# Patient Record
Sex: Male | Born: 1955 | Race: White | Hispanic: No | Smoking: Current every day smoker
Health system: Southern US, Community
[De-identification: ages and names within clinical notes are randomized; demographics above are authoritative.]

## PROBLEM LIST (undated history)

## (undated) ENCOUNTER — Ambulatory Visit (HOSPITAL_BASED_OUTPATIENT_CLINIC_OR_DEPARTMENT_OTHER): Admission: EM

## (undated) DIAGNOSIS — E785 Hyperlipidemia, unspecified: Secondary | ICD-10-CM

## (undated) DIAGNOSIS — N3281 Overactive bladder: Secondary | ICD-10-CM

## (undated) DIAGNOSIS — C189 Malignant neoplasm of colon, unspecified: Secondary | ICD-10-CM

## (undated) DIAGNOSIS — I2510 Atherosclerotic heart disease of native coronary artery without angina pectoris: Secondary | ICD-10-CM

## (undated) DIAGNOSIS — F329 Major depressive disorder, single episode, unspecified: Secondary | ICD-10-CM

## (undated) DIAGNOSIS — G4700 Insomnia, unspecified: Secondary | ICD-10-CM

## (undated) DIAGNOSIS — N400 Enlarged prostate without lower urinary tract symptoms: Secondary | ICD-10-CM

## (undated) DIAGNOSIS — Z87442 Personal history of urinary calculi: Secondary | ICD-10-CM

## (undated) DIAGNOSIS — T07XXXA Unspecified multiple injuries, initial encounter: Secondary | ICD-10-CM

## (undated) DIAGNOSIS — F32A Depression, unspecified: Secondary | ICD-10-CM

## (undated) DIAGNOSIS — F419 Anxiety disorder, unspecified: Secondary | ICD-10-CM

## (undated) HISTORY — PX: HAND SURGERY: SHX662

## (undated) HISTORY — DX: Hyperlipidemia, unspecified: E78.5

## (undated) HISTORY — DX: Depression, unspecified: F32.A

## (undated) HISTORY — DX: Overactive bladder: N32.81

## (undated) HISTORY — PX: OTHER SURGICAL HISTORY: SHX169

## (undated) HISTORY — DX: Malignant neoplasm of colon, unspecified: C18.9

## (undated) HISTORY — DX: Atherosclerotic heart disease of native coronary artery without angina pectoris: I25.10

## (undated) HISTORY — DX: Major depressive disorder, single episode, unspecified: F32.9

## (undated) HISTORY — DX: Insomnia, unspecified: G47.00

## (undated) HISTORY — DX: Anxiety disorder, unspecified: F41.9

## (undated) HISTORY — PX: ELBOW SURGERY: SHX618

## (undated) HISTORY — DX: Personal history of urinary calculi: Z87.442

## (undated) HISTORY — PX: HERNIA REPAIR: SHX51

## (undated) HISTORY — DX: Unspecified multiple injuries, initial encounter: T07.XXXA

## (undated) HISTORY — PX: HIP FRACTURE SURGERY: SHX118

## (undated) HISTORY — DX: Benign prostatic hyperplasia without lower urinary tract symptoms: N40.0

---

## 1998-10-30 ENCOUNTER — Ambulatory Visit (HOSPITAL_BASED_OUTPATIENT_CLINIC_OR_DEPARTMENT_OTHER): Admission: RE | Admit: 1998-10-30 | Discharge: 1998-10-30 | Payer: Self-pay | Admitting: General Surgery

## 1999-05-26 ENCOUNTER — Emergency Department (HOSPITAL_COMMUNITY): Admission: EM | Admit: 1999-05-26 | Discharge: 1999-05-26 | Payer: Self-pay | Admitting: Emergency Medicine

## 2000-01-11 ENCOUNTER — Emergency Department (HOSPITAL_COMMUNITY): Admission: EM | Admit: 2000-01-11 | Discharge: 2000-01-11 | Payer: Self-pay | Admitting: Emergency Medicine

## 2000-01-16 ENCOUNTER — Emergency Department (HOSPITAL_COMMUNITY): Admission: EM | Admit: 2000-01-16 | Discharge: 2000-01-16 | Payer: Self-pay | Admitting: Emergency Medicine

## 2001-10-20 DIAGNOSIS — C189 Malignant neoplasm of colon, unspecified: Secondary | ICD-10-CM

## 2001-10-20 HISTORY — DX: Malignant neoplasm of colon, unspecified: C18.9

## 2002-02-17 HISTORY — PX: COLECTOMY: SHX59

## 2002-02-24 ENCOUNTER — Ambulatory Visit (HOSPITAL_COMMUNITY): Admission: RE | Admit: 2002-02-24 | Discharge: 2002-02-24 | Payer: Self-pay | Admitting: Surgery

## 2002-02-24 ENCOUNTER — Encounter: Payer: Self-pay | Admitting: Surgery

## 2002-02-28 ENCOUNTER — Encounter: Payer: Self-pay | Admitting: Surgery

## 2002-02-28 ENCOUNTER — Encounter: Admission: RE | Admit: 2002-02-28 | Discharge: 2002-02-28 | Payer: Self-pay | Admitting: Surgery

## 2002-03-09 ENCOUNTER — Encounter (INDEPENDENT_AMBULATORY_CARE_PROVIDER_SITE_OTHER): Payer: Self-pay | Admitting: *Deleted

## 2002-03-09 ENCOUNTER — Encounter (INDEPENDENT_AMBULATORY_CARE_PROVIDER_SITE_OTHER): Payer: Self-pay | Admitting: Specialist

## 2002-03-09 ENCOUNTER — Inpatient Hospital Stay (HOSPITAL_COMMUNITY): Admission: RE | Admit: 2002-03-09 | Discharge: 2002-03-16 | Payer: Self-pay | Admitting: Surgery

## 2002-03-18 ENCOUNTER — Ambulatory Visit: Admission: RE | Admit: 2002-03-18 | Discharge: 2002-06-16 | Payer: Self-pay | Admitting: *Deleted

## 2002-03-23 ENCOUNTER — Encounter: Payer: Self-pay | Admitting: Surgery

## 2002-03-23 ENCOUNTER — Ambulatory Visit (HOSPITAL_BASED_OUTPATIENT_CLINIC_OR_DEPARTMENT_OTHER): Admission: RE | Admit: 2002-03-23 | Discharge: 2002-03-23 | Payer: Self-pay | Admitting: Surgery

## 2002-04-24 ENCOUNTER — Encounter: Payer: Self-pay | Admitting: Oncology

## 2002-04-24 ENCOUNTER — Ambulatory Visit (HOSPITAL_COMMUNITY): Admission: RE | Admit: 2002-04-24 | Discharge: 2002-04-24 | Payer: Self-pay | Admitting: Oncology

## 2002-06-17 ENCOUNTER — Ambulatory Visit: Admission: RE | Admit: 2002-06-17 | Discharge: 2002-07-28 | Payer: Self-pay | Admitting: *Deleted

## 2002-10-28 ENCOUNTER — Ambulatory Visit: Admission: RE | Admit: 2002-10-28 | Discharge: 2002-10-28 | Payer: Self-pay | Admitting: *Deleted

## 2002-11-01 ENCOUNTER — Ambulatory Visit (HOSPITAL_BASED_OUTPATIENT_CLINIC_OR_DEPARTMENT_OTHER): Admission: RE | Admit: 2002-11-01 | Discharge: 2002-11-01 | Payer: Self-pay | Admitting: Surgery

## 2003-02-13 ENCOUNTER — Ambulatory Visit (HOSPITAL_COMMUNITY): Admission: RE | Admit: 2003-02-13 | Discharge: 2003-02-13 | Payer: Self-pay | Admitting: Oncology

## 2003-02-13 ENCOUNTER — Encounter: Payer: Self-pay | Admitting: Oncology

## 2003-02-24 ENCOUNTER — Ambulatory Visit (HOSPITAL_COMMUNITY): Admission: RE | Admit: 2003-02-24 | Discharge: 2003-02-24 | Payer: Self-pay | Admitting: Surgery

## 2003-04-11 ENCOUNTER — Encounter (INDEPENDENT_AMBULATORY_CARE_PROVIDER_SITE_OTHER): Payer: Self-pay | Admitting: *Deleted

## 2003-04-26 ENCOUNTER — Encounter: Payer: Self-pay | Admitting: Oncology

## 2003-04-26 ENCOUNTER — Ambulatory Visit (HOSPITAL_COMMUNITY): Admission: RE | Admit: 2003-04-26 | Discharge: 2003-04-26 | Payer: Self-pay | Admitting: Oncology

## 2003-07-21 ENCOUNTER — Ambulatory Visit (HOSPITAL_COMMUNITY): Admission: RE | Admit: 2003-07-21 | Discharge: 2003-07-21 | Payer: Self-pay | Admitting: Oncology

## 2003-07-21 ENCOUNTER — Encounter: Payer: Self-pay | Admitting: Oncology

## 2003-09-22 ENCOUNTER — Encounter (INDEPENDENT_AMBULATORY_CARE_PROVIDER_SITE_OTHER): Payer: Self-pay | Admitting: *Deleted

## 2003-11-20 ENCOUNTER — Ambulatory Visit (HOSPITAL_COMMUNITY): Admission: RE | Admit: 2003-11-20 | Discharge: 2003-11-20 | Payer: Self-pay | Admitting: Oncology

## 2004-02-12 ENCOUNTER — Encounter (INDEPENDENT_AMBULATORY_CARE_PROVIDER_SITE_OTHER): Payer: Self-pay | Admitting: *Deleted

## 2004-02-19 ENCOUNTER — Encounter: Admission: RE | Admit: 2004-02-19 | Discharge: 2004-02-19 | Payer: Self-pay | Admitting: Family Medicine

## 2004-02-19 ENCOUNTER — Encounter (INDEPENDENT_AMBULATORY_CARE_PROVIDER_SITE_OTHER): Payer: Self-pay | Admitting: *Deleted

## 2004-05-23 ENCOUNTER — Encounter (INDEPENDENT_AMBULATORY_CARE_PROVIDER_SITE_OTHER): Payer: Self-pay | Admitting: *Deleted

## 2004-11-20 ENCOUNTER — Ambulatory Visit: Payer: Self-pay | Admitting: Oncology

## 2004-11-22 ENCOUNTER — Encounter (INDEPENDENT_AMBULATORY_CARE_PROVIDER_SITE_OTHER): Payer: Self-pay | Admitting: *Deleted

## 2004-11-22 ENCOUNTER — Ambulatory Visit (HOSPITAL_COMMUNITY): Admission: RE | Admit: 2004-11-22 | Discharge: 2004-11-22 | Payer: Self-pay | Admitting: Oncology

## 2005-03-07 ENCOUNTER — Encounter (INDEPENDENT_AMBULATORY_CARE_PROVIDER_SITE_OTHER): Payer: Self-pay | Admitting: *Deleted

## 2005-04-01 ENCOUNTER — Ambulatory Visit (HOSPITAL_COMMUNITY): Admission: RE | Admit: 2005-04-01 | Discharge: 2005-04-01 | Payer: Self-pay | Admitting: Surgery

## 2005-06-18 ENCOUNTER — Encounter (INDEPENDENT_AMBULATORY_CARE_PROVIDER_SITE_OTHER): Payer: Self-pay | Admitting: *Deleted

## 2005-11-24 ENCOUNTER — Ambulatory Visit: Payer: Self-pay | Admitting: Oncology

## 2006-05-22 ENCOUNTER — Ambulatory Visit: Payer: Self-pay | Admitting: Oncology

## 2006-06-19 ENCOUNTER — Encounter (INDEPENDENT_AMBULATORY_CARE_PROVIDER_SITE_OTHER): Payer: Self-pay | Admitting: *Deleted

## 2006-07-29 ENCOUNTER — Encounter (INDEPENDENT_AMBULATORY_CARE_PROVIDER_SITE_OTHER): Payer: Self-pay | Admitting: *Deleted

## 2006-12-08 ENCOUNTER — Ambulatory Visit: Payer: Self-pay | Admitting: Oncology

## 2007-06-09 ENCOUNTER — Ambulatory Visit: Payer: Self-pay | Admitting: Oncology

## 2007-06-11 LAB — PSA: PSA: 0.44 ng/mL (ref 0.10–4.00)

## 2007-06-11 LAB — CEA: CEA: 1.8 ng/mL (ref 0.0–5.0)

## 2008-06-01 ENCOUNTER — Encounter (INDEPENDENT_AMBULATORY_CARE_PROVIDER_SITE_OTHER): Payer: Self-pay | Admitting: *Deleted

## 2008-06-01 ENCOUNTER — Encounter: Admission: RE | Admit: 2008-06-01 | Discharge: 2008-06-01 | Payer: Self-pay | Admitting: Family Medicine

## 2008-06-04 ENCOUNTER — Ambulatory Visit: Payer: Self-pay | Admitting: Oncology

## 2008-06-28 ENCOUNTER — Encounter (INDEPENDENT_AMBULATORY_CARE_PROVIDER_SITE_OTHER): Payer: Self-pay | Admitting: *Deleted

## 2008-07-14 ENCOUNTER — Ambulatory Visit (HOSPITAL_COMMUNITY): Admission: RE | Admit: 2008-07-14 | Discharge: 2008-07-14 | Payer: Self-pay | Admitting: Obstetrics and Gynecology

## 2008-07-14 ENCOUNTER — Encounter (INDEPENDENT_AMBULATORY_CARE_PROVIDER_SITE_OTHER): Payer: Self-pay | Admitting: *Deleted

## 2008-08-20 DIAGNOSIS — T07XXXA Unspecified multiple injuries, initial encounter: Secondary | ICD-10-CM | POA: Insufficient documentation

## 2008-08-20 HISTORY — DX: Unspecified multiple injuries, initial encounter: T07.XXXA

## 2008-09-12 ENCOUNTER — Encounter (INDEPENDENT_AMBULATORY_CARE_PROVIDER_SITE_OTHER): Payer: Self-pay | Admitting: *Deleted

## 2008-09-14 ENCOUNTER — Emergency Department (HOSPITAL_COMMUNITY): Admission: EM | Admit: 2008-09-14 | Discharge: 2008-09-14 | Payer: Self-pay | Admitting: Emergency Medicine

## 2008-11-13 ENCOUNTER — Ambulatory Visit: Payer: Self-pay | Admitting: *Deleted

## 2008-11-13 DIAGNOSIS — N318 Other neuromuscular dysfunction of bladder: Secondary | ICD-10-CM

## 2008-11-13 DIAGNOSIS — N400 Enlarged prostate without lower urinary tract symptoms: Secondary | ICD-10-CM | POA: Insufficient documentation

## 2008-11-13 DIAGNOSIS — E785 Hyperlipidemia, unspecified: Secondary | ICD-10-CM

## 2008-11-13 DIAGNOSIS — F418 Other specified anxiety disorders: Secondary | ICD-10-CM

## 2008-11-13 DIAGNOSIS — F411 Generalized anxiety disorder: Secondary | ICD-10-CM | POA: Insufficient documentation

## 2008-11-13 HISTORY — DX: Other specified anxiety disorders: F41.8

## 2008-11-13 HISTORY — DX: Other neuromuscular dysfunction of bladder: N31.8

## 2008-11-13 HISTORY — DX: Hyperlipidemia, unspecified: E78.5

## 2008-11-13 LAB — CONVERTED CEMR LAB
ALT: 13 units/L (ref 0–53)
AST: 15 units/L (ref 0–37)
Albumin: 3.9 g/dL (ref 3.5–5.2)
Alkaline Phosphatase: 95 units/L (ref 39–117)
BUN: 10 mg/dL (ref 6–23)
Basophils Absolute: 0 10*3/uL (ref 0.0–0.1)
Basophils Relative: 0.1 % (ref 0.0–3.0)
CO2: 32 meq/L (ref 19–32)
Calcium: 9.8 mg/dL (ref 8.4–10.5)
Chloride: 103 meq/L (ref 96–112)
Creatinine, Ser: 0.8 mg/dL (ref 0.4–1.5)
Eosinophils Absolute: 0.2 10*3/uL (ref 0.0–0.7)
Eosinophils Relative: 3.8 % (ref 0.0–5.0)
GFR calc Af Amer: 130 mL/min
GFR calc non Af Amer: 107 mL/min
Glucose, Bld: 88 mg/dL (ref 70–99)
HCT: 41.7 % (ref 39.0–52.0)
Hemoglobin: 14.3 g/dL (ref 13.0–17.0)
Lymphocytes Relative: 16.3 % (ref 12.0–46.0)
MCHC: 34.3 g/dL (ref 30.0–36.0)
MCV: 91.4 fL (ref 78.0–100.0)
Monocytes Absolute: 0.5 10*3/uL (ref 0.1–1.0)
Monocytes Relative: 8.6 % (ref 3.0–12.0)
Neutro Abs: 4 10*3/uL (ref 1.4–7.7)
Neutrophils Relative %: 71.2 % (ref 43.0–77.0)
Platelets: 178 10*3/uL (ref 150–400)
Potassium: 4.3 meq/L (ref 3.5–5.1)
RBC: 4.56 M/uL (ref 4.22–5.81)
RDW: 13.5 % (ref 11.5–14.6)
Sodium: 142 meq/L (ref 135–145)
TSH: 1.27 microintl units/mL (ref 0.35–5.50)
Total Bilirubin: 0.7 mg/dL (ref 0.3–1.2)
Total Protein: 6.8 g/dL (ref 6.0–8.3)
WBC: 5.6 10*3/uL (ref 4.5–10.5)

## 2009-01-04 ENCOUNTER — Encounter (INDEPENDENT_AMBULATORY_CARE_PROVIDER_SITE_OTHER): Payer: Self-pay | Admitting: *Deleted

## 2009-01-04 DIAGNOSIS — C189 Malignant neoplasm of colon, unspecified: Secondary | ICD-10-CM | POA: Insufficient documentation

## 2009-01-04 DIAGNOSIS — M722 Plantar fascial fibromatosis: Secondary | ICD-10-CM | POA: Insufficient documentation

## 2009-01-04 DIAGNOSIS — E739 Lactose intolerance, unspecified: Secondary | ICD-10-CM

## 2009-01-04 HISTORY — DX: Lactose intolerance, unspecified: E73.9

## 2009-01-04 HISTORY — DX: Malignant neoplasm of colon, unspecified: C18.9

## 2009-03-21 ENCOUNTER — Telehealth: Payer: Self-pay | Admitting: Internal Medicine

## 2009-03-26 ENCOUNTER — Telehealth: Payer: Self-pay | Admitting: Internal Medicine

## 2009-04-25 ENCOUNTER — Telehealth: Payer: Self-pay | Admitting: Internal Medicine

## 2009-04-27 ENCOUNTER — Ambulatory Visit: Payer: Self-pay | Admitting: Internal Medicine

## 2009-04-27 DIAGNOSIS — G4700 Insomnia, unspecified: Secondary | ICD-10-CM | POA: Insufficient documentation

## 2009-04-27 HISTORY — DX: Insomnia, unspecified: G47.00

## 2009-05-14 ENCOUNTER — Ambulatory Visit: Payer: Self-pay | Admitting: Internal Medicine

## 2009-05-15 ENCOUNTER — Encounter: Payer: Self-pay | Admitting: Internal Medicine

## 2009-06-13 LAB — CONVERTED CEMR LAB
ALT: 10 units/L (ref 0–53)
AST: 14 units/L (ref 0–37)
Albumin: 4.3 g/dL (ref 3.5–5.2)
Alkaline Phosphatase: 81 units/L (ref 39–117)
BUN: 24 mg/dL — ABNORMAL HIGH (ref 6–23)
Bilirubin, Direct: 0.2 mg/dL (ref 0.0–0.3)
CO2: 23 meq/L (ref 19–32)
Calcium: 9.2 mg/dL (ref 8.4–10.5)
Chloride: 112 meq/L (ref 96–112)
Cholesterol: 142 mg/dL (ref 0–200)
Creatinine, Ser: 0.9 mg/dL (ref 0.40–1.50)
Glucose, Bld: 87 mg/dL (ref 70–99)
HDL: 32 mg/dL — ABNORMAL LOW (ref >39)
Indirect Bilirubin: 0.6 mg/dL (ref 0.0–0.9)
LDL Cholesterol: 98 mg/dL (ref 0–99)
Potassium: 4.5 meq/L (ref 3.5–5.3)
Sodium: 145 meq/L (ref 135–145)
TSH: 0.827 microintl units/mL (ref 0.350–4.500)
Total Bilirubin: 0.8 mg/dL (ref 0.3–1.2)
Total CHOL/HDL Ratio: 4.4
Total Protein: 6.3 g/dL (ref 6.0–8.3)
Triglycerides: 61 mg/dL (ref <150)
VLDL: 12 mg/dL (ref 0–40)

## 2009-07-24 ENCOUNTER — Telehealth: Payer: Self-pay | Admitting: Internal Medicine

## 2009-10-22 ENCOUNTER — Ambulatory Visit: Payer: Self-pay | Admitting: Internal Medicine

## 2009-10-22 LAB — CONVERTED CEMR LAB
ALT: 11 units/L (ref 0–53)
AST: 14 units/L (ref 0–37)
Albumin: 4.4 g/dL (ref 3.5–5.2)
Alkaline Phosphatase: 67 units/L (ref 39–117)
BUN: 16 mg/dL (ref 6–23)
Bilirubin, Direct: 0.1 mg/dL (ref 0.0–0.3)
CO2: 24 meq/L (ref 19–32)
Calcium: 9.5 mg/dL (ref 8.4–10.5)
Chloride: 105 meq/L (ref 96–112)
Cholesterol: 161 mg/dL (ref 0–200)
Creatinine, Ser: 0.99 mg/dL (ref 0.40–1.50)
Glucose, Bld: 95 mg/dL (ref 70–99)
HDL: 32 mg/dL — ABNORMAL LOW (ref >39)
Indirect Bilirubin: 0.8 mg/dL (ref 0.0–0.9)
LDL Cholesterol: 109 mg/dL — ABNORMAL HIGH (ref 0–99)
Potassium: 4.4 meq/L (ref 3.5–5.3)
Sodium: 142 meq/L (ref 135–145)
TSH: 1.391 microintl units/mL (ref 0.350–4.500)
Total Bilirubin: 0.9 mg/dL (ref 0.3–1.2)
Total CHOL/HDL Ratio: 5
Total Protein: 6.6 g/dL (ref 6.0–8.3)
Triglycerides: 98 mg/dL (ref <150)
VLDL: 20 mg/dL (ref 0–40)

## 2009-10-29 ENCOUNTER — Encounter (INDEPENDENT_AMBULATORY_CARE_PROVIDER_SITE_OTHER): Payer: Self-pay | Admitting: *Deleted

## 2009-10-29 ENCOUNTER — Ambulatory Visit: Payer: Self-pay | Admitting: Internal Medicine

## 2009-10-29 DIAGNOSIS — M549 Dorsalgia, unspecified: Secondary | ICD-10-CM

## 2009-10-29 HISTORY — DX: Dorsalgia, unspecified: M54.9

## 2009-11-07 ENCOUNTER — Encounter: Payer: Self-pay | Admitting: Internal Medicine

## 2009-11-08 ENCOUNTER — Telehealth: Payer: Self-pay | Admitting: Internal Medicine

## 2010-03-22 ENCOUNTER — Ambulatory Visit: Payer: Self-pay | Admitting: Internal Medicine

## 2010-03-29 ENCOUNTER — Ambulatory Visit: Payer: Self-pay | Admitting: Diagnostic Radiology

## 2010-03-29 ENCOUNTER — Ambulatory Visit: Payer: Self-pay | Admitting: Internal Medicine

## 2010-03-29 ENCOUNTER — Ambulatory Visit (HOSPITAL_BASED_OUTPATIENT_CLINIC_OR_DEPARTMENT_OTHER): Admission: RE | Admit: 2010-03-29 | Discharge: 2010-03-29 | Payer: Self-pay | Admitting: Internal Medicine

## 2010-03-29 DIAGNOSIS — M545 Low back pain, unspecified: Secondary | ICD-10-CM | POA: Insufficient documentation

## 2010-03-29 LAB — CONVERTED CEMR LAB
Bilirubin Urine: NEGATIVE
Glucose, Urine, Semiquant: NEGATIVE
Ketones, urine, test strip: NEGATIVE
Nitrite: NEGATIVE
Specific Gravity, Urine: 1.015
Urobilinogen, UA: 2
WBC Urine, dipstick: NEGATIVE
pH: 7

## 2010-04-01 ENCOUNTER — Encounter: Payer: Self-pay | Admitting: Internal Medicine

## 2010-06-16 ENCOUNTER — Ambulatory Visit: Payer: Self-pay | Admitting: Diagnostic Radiology

## 2010-06-16 ENCOUNTER — Emergency Department (HOSPITAL_BASED_OUTPATIENT_CLINIC_OR_DEPARTMENT_OTHER): Admission: EM | Admit: 2010-06-16 | Discharge: 2010-06-16 | Payer: Self-pay | Admitting: Emergency Medicine

## 2010-07-11 ENCOUNTER — Telehealth: Payer: Self-pay | Admitting: Internal Medicine

## 2010-08-28 ENCOUNTER — Telehealth: Payer: Self-pay | Admitting: Internal Medicine

## 2010-09-25 ENCOUNTER — Ambulatory Visit: Payer: Self-pay | Admitting: Internal Medicine

## 2010-09-26 ENCOUNTER — Telehealth: Payer: Self-pay | Admitting: Gastroenterology

## 2010-09-27 ENCOUNTER — Ambulatory Visit: Payer: Self-pay | Admitting: Gastroenterology

## 2010-10-08 ENCOUNTER — Ambulatory Visit: Payer: Self-pay | Admitting: Internal Medicine

## 2010-10-08 DIAGNOSIS — R209 Unspecified disturbances of skin sensation: Secondary | ICD-10-CM | POA: Insufficient documentation

## 2010-11-08 ENCOUNTER — Encounter: Payer: Self-pay | Admitting: Gastroenterology

## 2010-11-10 ENCOUNTER — Encounter: Payer: Self-pay | Admitting: Oncology

## 2010-11-21 NOTE — Miscellaneous (Signed)
Summary: Controlled Substance Agreement  Controlled Substance Agreement   Imported By: Lanelle Bal 03/27/2010 13:59:48  _____________________________________________________________________  External Attachment:    Type:   Image     Comment:   External Document

## 2010-11-21 NOTE — Assessment & Plan Note (Signed)
Summary: 6 month rov=ch   Vital Signs:  Patient profile:   55 year old male Weight:      181 pounds BMI:     25.34 O2 Sat:      98 % on Room air Temp:     98.1 degrees F oral Pulse rate:   66 / minute Pulse rhythm:   regular Resp:     16 per minute BP sitting:   110 / 70  (right arm) Cuff size:   large  Vitals Entered By: Glendell Docker CMA (October 29, 2009 8:00 AM)  O2 Flow:  Room air  Primary Care Provider:  D. Thomos Lemons DO  CC:  6 month Follow up .  History of Present Illness: 6 Month Follow up   55 y/o white male for follow up.  He has hx of traumatic fall with mulitple vertebral fractures.  He has been followed by neurosurgeon in [facility].  he has regained function but continue have chronic back pain issues.  he also has chronic right hip pain.  he has been taking percocet as needed. he tries to take sparingly but takes chronically     Preventive Screening-Counseling & Management  Alcohol-Tobacco     Smoking Status: quit  Allergies (verified): No Known Drug Allergies  Past History:  Past Medical History: colon cancer 2003 - specialist - due for repeat colonoscopy in 2012 Anxiety - takes approximately 10 diazepam / month - contracts to no more than that amount Depression Hyperlipidemia  Benign prostatic hypertrophy - sees urology   overactive bladder 08/2008 patient fell off a roof about 13 feet onto concrete - multiple fractures including vertebral - now in a wheelchair - Dr. 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   Neck:   supple and no masses.   Lungs:  normal respiratory effort and normal breath sounds.   Heart:  normal rate, regular rhythm, no murmur, and no gallop.   Neurologic:  cranial nerves II-XII intact and gait normal.   Psych:  normally interactive and good eye contact.     Impression & Recommendations:  Problem # 1:  INSOMNIA, CHRONIC (ICD-307.42) Pt has chronic low back pain and right hip pain from previous trauma.  Refer to pain mgt  Problem # 2:  BACK PAIN, CHRONIC (ICD-724.5) Pt using percocet chronically.  I advised pt to establish with pain mgt specialist.  His updated medication list for this problem includes:    Oxycodone-acetaminophen 5-325 Mg Tabs (Oxycodone-acetaminophen) ..... One by mouth two times a day prn  Orders: Pain Clinic Referral (Pain)  Complete Medication List: 1)  Trazodone Hcl 150 Mg Tabs (Trazodone hcl) .... One by mouth at bedtime prn 2)  Oxycodone-acetaminophen 5-325 Mg Tabs (Oxycodone-acetaminophen) .... One by mouth two times a day prn 3)  Cialis 20 Mg Tabs (Tadalafil) .... One by mouth once daily as needed  Patient Instructions: 1)  Our office will contact you regarding referral to pain mgt specialist.  2)  Please schedule a follow-up appointment in 6 months. Prescriptions: CIALIS 20 MG TABS (  TADALAFIL) one by mouth once daily as needed  #10 x 5   Entered and Authorized by:   D. Thomos Lemons DO   Signed by:   D. Thomos Lemons DO on 10/29/2009   Method used:   [facility]        [facility]  [address] [phone]* (retail)       [address]       [place], [place]  [address]       Ph: [phone] or [phone]       Fax: [phone]   RxID:   [id number] TRAZODONE HCL 150 MG TABS (TRAZODONE HCL) one by mouth at bedtime prn  #90 x 1   Entered and Authorized by:   D. Thomos Lemons DO   Signed by:   D. Thomos Lemons DO on 10/29/2009   Method used:   [facility]        [facility]  [address] [phone]* (retail)       [address]       [place], [place]  [address]       Ph: [phone] or [phone]       Fax: [phone]   RxID:   [id number] OXYCODONE-ACETAMINOPHEN 5-325 MG TABS (OXYCODONE-ACETAMINOPHEN) one by mouth two times a day prn  #30 x 0   Entered and Authorized by:   D. Thomos Lemons DO   Signed by:   D. Thomos Lemons DO on 10/29/2009   Method used:   Print then Give to Patient   RxID:   [id number]   Current Allergies (reviewed today): No known allergies    Immunization History:  Tetanus/Td Immunization History:    Tetanus/Td:  historical (09/14/2009)

## 2010-11-21 NOTE — Assessment & Plan Note (Signed)
Summary: Hx of colon ca, BM changes   History of Present Illness Visit Type: Initial Consult Primary GI MD: Melvia Heaps MD [facility] Primary Provider: Dondra Spry DO Requesting Provider: Dondra Spry DO Chief Complaint: Consult colon. Pt c/o feeling of having had a BM and nothing there in toliet. Pt has Hx of colon cancer  History of Present Illness:   Justin [name] is a pleasant 55 year old white male with history of colon cancer referred at the request of Dr. Artist Pais for followup colonoscopy.  He underwent a sigmoid resection in 2003.  This was followed by chemotherapy and x-ray therapy.  He has had one followup colonoscopy.  He has no GI complaints with the exception of occasional feeling like moving his bowels but having nothing to show in the toilet.  He denies rectal bleeding or abdominal pain.   GI Review of Systems      Denies abdominal pain, acid reflux, belching, bloating, chest pain, dysphagia with liquids, dysphagia with solids, heartburn, loss of appetite, nausea, vomiting, vomiting blood, weight loss, and  weight gain.      Reports constipation.     Denies anal fissure, black tarry stools, change in bowel habit, diarrhea, diverticulosis, fecal incontinence, heme positive stool, hemorrhoids, irritable bowel syndrome, jaundice, light color stool, liver problems, rectal bleeding, and  rectal pain.    Current Medications (verified): 1)  Trazodone Hcl 150 Mg Tabs (Trazodone Hcl) .... One By Mouth At Bedtime Prn 2)  Hydrocodone-Acetaminophen 5-500 Mg Tabs (Hydrocodone-Acetaminophen) .... One To Two Tabs By Mouth Two Times A Day As Needed For Back Pain 3)  Aspirin 81 Mg Tbec (Aspirin) .... One By Mouth Once Daily 4)  Diazepam 5 Mg Tabs (Diazepam) .... One By Mouth At Bedtime As Needed 5)  Metrogel 1 % Gel (Metronidazole) .... Apply Two Times A Day As Directed 6)  Cialis 5 Mg Tabs (Tadalafil) .... One By Mouth Once Daily 7)  Valtrex 1 Gm Tabs (Valacyclovir Hcl) .... One By Mouth Three  Times A Day 8)  Gabapentin 300 Mg Caps (Gabapentin) .... One By Mouth Three Times A Day  Allergies (verified): No Known Drug Allergies  Past History:  Past Medical History: colon cancer 2003 - specialist - due for repeat colonoscopy in 2012 Anxiety - takes approximately 10 diazepam / month - contracts to no more than that amount Depression   Hyperlipidemia   Benign prostatic hypertrophy - sees urology   overactive bladder 08/2008 patient fell off a roof about 13 feet onto concrete - multiple fractures including vertebral  Hx of kidney stones Insomnia   Past Surgical History: hip repair elbow repair hernia repair sigmoid colectomy---May 2003   Family History: CAD-father      No FH of Colon Cancer:  Social History: 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  The patient denies allergy/sinus, anemia, anxiety-new, blood in urine, breast changes/lumps, change in vision, confusion, cough, coughing up blood, depression-new, fainting, fever, headaches-new, hearing problems, heart murmur, heart rhythm changes, itching, menstrual pain, night sweats, nosebleeds, pregnancy symptoms, shortness of breath, skin rash, sleeping problems, sore throat, swelling of feet/legs, swollen  lymph glands, thirst - excessive , urination - excessive , urination changes/pain, urine leakage, vision changes, and voice change.         All other systems were reviewed and were negative   Vital Signs:  Patient profile:   55 year old male Height:      71 inches Weight:      180 pounds BMI:     25.20 BSA:     2.02 Pulse rate:   76 / minute Pulse rhythm:   regular BP  sitting:   120 / 64  (left arm) Cuff size:   regular  Vitals Entered By: Ok Anis CMA (November 08, 2010 9:05 AM)  Physical Exam  Additional Exam:  On physical exam he is a well-developed well-nourished male  skin: anicteric HEENT: normocephalic; PEERLA; no nasal or pharyngeal abnormalities neck: supple nodes: no cervical lymphadenopathy chest: clear to ausculatation and percussion heart: no murmurs, gallops, or rubs abd: soft, nontender; BS normoactive; no abdominal masses, tenderness, organomegaly rectal: deferred ext: no cynanosis, clubbing, edema skeletal: no deformities neuro: oriented x 3; no focal abnormalities    Impression & Recommendations:  Problem # 1:  ADENOCARCINOMA, COLON (ICD-153.9)  Status post sigmoid resection in 2003 followed by chemotherapy and x-ray therapy  Recommendations #1 colonoscopy  Orders: Colonoscopy (Colon)  Patient Instructions: 1)  Copy sent to : D. Thomos Lemons D0 2)  Your colonoscopy is scheduled on 12/10/2010 at 10:30am 3)  Constipation and Hemorrhoids brochure given.  4)  Conscious Sedation brochure given.  5)  The medication list was reviewed and reconciled.  All changed / newly prescribed medications were explained.  A complete medication list was provided to the patient / caregiver. Prescriptions: MOVIPREP 100 GM  SOLR (PEG-KCL-NACL-NASULF-NA ASC-C) As per prep instructions.  #1 x 0   Entered by:   Merri Ray CMA (AAMA)   Authorized by:   Louis Meckel MD   Signed by:   Merri Ray CMA (AAMA) on 11/08/2010   Method used:   [facility]        [facility]  [address] [phone]* (retail)       [address]       [place], [place]  [address]       Ph: [phone] or [phone]       Fax: [phone]   RxID:   [id number]

## 2010-11-21 NOTE — Progress Notes (Signed)
Summary: Triage  Phone Note From Other Clinic   Caller: [facility] @ [facility] [phone] Call For: Dr. Arlyce Dice Summary of Call: Hx of COL cancer and having changes in BM...requesting sooner appt. than next avail. Initial call taken by: Karna Christmas,  September 26, 2010 10:59 AM  Follow-up for Phone Call        Scheduled appointment with Myriam Jacobson at [facility] for 09/27/10 @2pm . Myriam Jacobson will call back if patient cannot keep appointment.

## 2010-11-21 NOTE — Progress Notes (Signed)
Summary: Hydrocodone Refill  Phone Note Outgoing Call   Call placed by: Glendell Docker CMA,  August 28, 2010 4:10 PM Call placed to: Patient Summary of Call: Rx refill received from [facility] mail order for Hydrocodone. 5-500. Call was placed to patient at [phone] to verify if he requested rx for refill. He states that he did request the refill for [facility]  mail order. Patient did not clarify where previous rx was filled that was presribed by Dr Artist Pais in June 2011.  He was advised that I would have to check with Dr Artist Pais for approval of refill. He states that he has 2 remaining refills on rx.  [facility] spoke with [facility]ist he states patient filled a rx in January prescribed  by Dr Artist Pais, and He received a rx from Dr Lestine Box in August for a quantity of 30 NR..  Call placed to [facility] pharmacy to see if patient filled pain rx there previous. Spoke with Baldo Ash, he states this is a new rx request.  Patient has not filled rx for Hydrocodone there previously. Initial call taken by: Glendell Docker CMA,  August 28, 2010 4:24 PM  Follow-up for Phone Call        ok to refill hydrocodone x 2 Follow-up by: D. Thomos Lemons DO,  August 29, 2010 1:24 PM  Additional Follow-up for Phone Call Additional follow up Details #1::        call returned to patient [phone], he has been advised rx phone to pharmacy Additional Follow-up by: Glendell Docker CMA,  August 29, 2010 3:29 PM    Prescriptions: HYDROCODONE-ACETAMINOPHEN 5-500 MG TABS (HYDROCODONE-ACETAMINOPHEN) one to two tabs by mouth two times a day as needed for back pain  #60 x 2   Entered by:   Glendell Docker CMA   Authorized by:   D. Thomos Lemons DO   Signed by:   Glendell Docker CMA on 08/29/2010   Method used:   Telephoned to ...       [facility]  [address] [phone]* (retail)       [address]       [place], [place]  [address]       Ph: [phone] or [phone]       Fax: [phone]   RxID:    [id number]

## 2010-11-21 NOTE — Assessment & Plan Note (Signed)
Summary: hurt back 2009 numbness in leg/dt--Rm 3   Vital Signs:  Patient profile:   55 year old male Height:      71 inches Weight:      174.25 pounds BMI:     24.39 Temp:     98.5 degrees F oral Pulse rate:   72 / minute Pulse rhythm:   regular Resp:     16 per minute BP sitting:   120 / 76  (right arm) Cuff size:   regular  Vitals Entered By: Mervin Kung CMA (March 29, 2010 3:37 PM) CC: Room 2  Pain across lower back and numbness down left leg. Is Patient Diabetic? No   Primary Care Provider:  Dondra Spry DO  CC:  Room 2  Pain across lower back and numbness down left leg.[name] Kitchen  History of Present Illness: 55 y/o recently seen for chronic low back pain reports exacerbation pain worse left lower back radiating to left leg and left groin no urinary symptoms no fever or chills  Allergies (verified): No Known Drug Allergies  Past History:  Past Medical History: colon cancer 2003 - specialist - due for repeat colonoscopy in 2012 Anxiety - takes approximately 10 diazepam / month - contracts to no more than that amount Depression   Hyperlipidemia  Benign prostatic hypertrophy - sees urology   overactive bladder 08/2008 patient fell off a roof about 13 feet onto concrete - multiple fractures including vertebral  Hx of kidney stones  Past Surgical History: hip repair elbow repair       Family History: CAD-father      Social History: 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   Lungs:  normal respiratory effort and normal breath sounds.   Heart:  normal rate, regular rhythm, and no gallop.   Abdomen:  soft, non-tender, no masses, and no inguinal hernia.   Genitalia:  circumcised and no scrotal  masses.   Neurologic:  cranial nerves II-XII intact and gait normal.     Impression & Recommendations:  Problem # 1:  LOW BACK PAIN, ACUTE (ICD-724.2) 55 y/o with exacerbation of low back pain radiating to left groin.  consider strain vs kidney stone. use prednisone taper.  use flomax in case pt passing stone. Patient advised to call office if symptoms persist or worsen.  His updated medication list for this problem includes:    Hydrocodone-acetaminophen 5-500 Mg Tabs (Hydrocodone-acetaminophen) ..... One to two tabs by mouth two times a day as needed for back pain    Aspirin 81 Mg Tbec (Aspirin) ..... One by mouth once daily  Orders: T-Lumbar Spine 2 Views (72100TC)  Complete Medication List: 1)  Trazodone Hcl 150 Mg Tabs (Trazodone hcl) .... One by mouth at bedtime prn 2)  Hydrocodone-acetaminophen 5-500 Mg Tabs (Hydrocodone-acetaminophen) .... One to two tabs by mouth two times a day as needed for back pain 3)  Cialis 20 Mg Tabs (Tadalafil) .... One by mouth once daily as needed 4)  Gabapentin 300 Mg Caps (Gabapentin) .... One by mouth two times a day 5)  Aspirin 81 Mg Tbec (Aspirin) .... One by mouth once daily 6)  Prednisone 10 Mg Tabs (Prednisone) .... 3 tabs by mouth once daily x 3  days, 2 tabs by mouth once daily x 3 days, 1 tab by mouth once daily x 3 days 7)  Tamsulosin Hcl 0.4 Mg Caps (Tamsulosin hcl) .... One by mouth qhs  Other Orders: UA Dipstick w/o Micro (manual) ([address]) Specimen Handling ([address]) T-Culture, Urine ([id number])  Patient Instructions: 1)  Call our office if your symptoms do not  improve or gets worse. 2)  Increase fluids Prescriptions: TAMSULOSIN HCL 0.4 MG CAPS (TAMSULOSIN HCL) one by mouth qhs  #30 x 1   Entered and Authorized by:   D. Thomos Lemons DO   Signed by:   D. Thomos Lemons DO on 03/29/2010   Method used:   [facility]        [facility]  [address] [phone]* (retail)       [address]       [place], [place]   [address]       Ph: [phone] or [phone]       Fax: [phone]   RxID:   [id number] PREDNISONE 10 MG TABS (PREDNISONE) 3 tabs by mouth once daily x 3 days, 2 tabs by mouth once daily x 3 days, 1 tab by mouth once daily x 3 days  #20 x 0   Entered and Authorized by:   D. Thomos Lemons DO   Signed by:   D. Thomos Lemons DO on 03/29/2010   Method used:   [facility]        [facility]  [address] [phone]* (retail)       [address]       [place], [place]  [address]       Ph: [phone] or [phone]       Fax: [phone]   RxID:   [id number]   Current Allergies (reviewed today): No known allergies   Laboratory Results   Urine Tests    Routine Urinalysis   Color: yellow Glucose: negative   (Normal Range: Negative) Bilirubin: negative   (Normal Range: Negative) Ketone: negative   (Normal Range: Negative) Spec. Gravity: 1.015   (Normal Range: 1.003-1.035) Blood: trace-intact   (Normal Range: Negative) pH: 7.0   (Normal Range: 5.0-8.0) Protein: trace   (Normal Range: Negative) Urobilinogen: 2.0   (Normal Range: 0-1) Nitrite: negative   (Normal Range: Negative) Leukocyte Esterace: negative   (Normal Range: Negative)

## 2010-11-21 NOTE — Assessment & Plan Note (Signed)
Summary: SHINGLES/MHF   Vital Signs:  Patient profile:   55 year old male Height:      71 inches Weight:      180.75 pounds BMI:     25.30 O2 Sat:      97 % on Room air Temp:     97.9 degrees F oral Pulse rate:   67 / minute Resp:     18 per minute BP sitting:   100 / 70  (right arm) Cuff size:   large  Vitals Entered By: Glendell Docker CMA (October 08, 2010 8:51 AM)  O2 Flow:  Room air CC: abdominal pain Is Patient Diabetic? No Pain Assessment Patient in pain? no      Comments c/o of tingling and burning sensation in torso for the past week, no rash present   Primary Care Provider:  Dondra Spry DO  CC:  abdominal pain.  History of Present Illness: 56 y/o white male c/o burining sensation of skin around abd no fever or chills no GI symptoms no obvious rash  Preventive Screening-Counseling & Management  Alcohol-Tobacco     Smoking Status: quit  Allergies (verified): No Known Drug Allergies  Past History:  Past Medical History: colon cancer 2003 - specialist - due for repeat colonoscopy in 2012 Anxiety - takes approximately 10 diazepam / month - contracts to no more than that amount Depression   Hyperlipidemia   Benign prostatic hypertrophy - sees urology   overactive bladder 08/2008 patient fell off a roof about 13 feet onto concrete - multiple fractures including vertebral  Hx of kidney stones  Past Surgical History: hip repair elbow repair        Family History: CAD-father       Social History: 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   Lungs:  normal respiratory effort and normal breath sounds.   Heart:  normal rate, regular rhythm, and no gallop.   Abdomen:  soft,  non-tender, no masses, no guarding, no rigidity, no rebound tenderness, no hepatomegaly, and no splenomegaly.   Skin:  color normal, no rashes, and no suspicious lesions.     Impression & Recommendations:  Problem # 1:  PARESTHESIA (ICD-782.0) 55 y/o male with burning sensation around abd possible early herpes zoster  start valtrex use gabapentin for burning pain if pain refractory , consider use of lidoderm patches  Complete Medication List: 1)  Trazodone Hcl 150 Mg Tabs (Trazodone hcl) .... One by mouth at bedtime prn 2)  Hydrocodone-acetaminophen 5-500 Mg Tabs (Hydrocodone-acetaminophen) .... One to two tabs by mouth two times a day as needed for back pain 3)  Cialis 20 Mg Tabs (Tadalafil) .... One by mouth once daily as needed 4)  Aspirin 81 Mg Tbec (Aspirin) .... One by mouth once daily 5)  Diazepam 5 Mg Tabs (Diazepam) .... One by mouth at bedtime as needed 6)  Metrogel 1 % Gel (Metronidazole) .... Apply two times a day as directed 7)  Cialis 5 Mg Tabs (Tadalafil) .... One by mouth once daily 8)  Valtrex 1 Gm Tabs (Valacyclovir hcl) .... One by mouth three times a day 9)  Gabapentin 300 Mg Caps (Gabapentin) .... One by mouth three times a day  Patient Instructions: 1)  Call our office if your symptoms do not  improve or gets worse. Prescriptions: GABAPENTIN 300 MG CAPS (GABAPENTIN) one by mouth three times a day  #90 x 0   Entered and Authorized by:   D. Thomos Lemons DO   Signed by:   D. Thomos Lemons DO on 10/08/2010   Method used:   [facility]        [facility]  [address] [phone]* (retail)       [address]       [place], [place]  [address]       Ph: [phone] or [phone]       Fax: [phone]   RxID:   [id number] VALTREX 1 GM TABS (VALACYCLOVIR HCL) one by mouth three times a day  #21 x 0   Entered and Authorized by:   D. Thomos Lemons DO   Signed by:   D. Thomos Lemons DO on 10/08/2010   Method used:   [facility]        [facility]   [address] [phone]* (retail)       [address]       [place], [place]  [address]       Ph: [phone] or [phone]       Fax: [phone]   RxID:   [id number]    Orders Added: 1)  Est. Patient Level III [376[address]]   Immunization History:  Influenza Immunization History:    Influenza:  historical (07/23/2010)   Immunization History:  Influenza Immunization History:    Influenza:  Historical (07/23/2010)   Current Allergies (reviewed today): No known allergies

## 2010-11-21 NOTE — Letter (Signed)
Summary: Results Letter  [facility]  [address] [place], [place] [address]   Phone: [phone]  Fax: [phone]        November 08, 2010 MRN: [id number]    Justin [name] [address] [place], [place]  [address]    Dear Justin [name],  It is my pleasure to have treated you recently as a new patient in my office. I appreciate your confidence and the opportunity to participate in your care.  Since I do have a busy inpatient endoscopy schedule and office schedule, my office hours vary weekly. I am, however, available for emergency calls everyday through my office. If I am not available for an urgent office appointment, another one of our gastroenterologist will be able to assist you.  My well-trained staff are prepared to help you at all times. For emergencies after office hours, a physician from our Gastroenterology section is always available through my 24 hour answering service  Once again I welcome you as a new patient and I look forward to a happy and healthy relationship             Sincerely,  Louis Meckel MD  This letter has been electronically signed by your physician.  Appended Document: Results Letter letter mailed

## 2010-11-21 NOTE — Progress Notes (Signed)
Summary: refill--Tamsulosin  Phone Note Refill Request  on July 11, 2010 2:58 PM  Refills Requested: Medication #1:  TAMSULOSIN HCL 0.4 MG CAPS one by mouth qhs.   Dosage confirmed as above?Dosage Confirmed   Supply Requested: 3 months   Last Refilled: 03/29/2010 [employer] Member ID# [id number]  Next Appointment Scheduled: 09-25-10 Initial call taken by: Mervin Kung CMA Duncan Dull),  July 11, 2010 2:59 PM    Prescriptions: TAMSULOSIN HCL 0.4 MG CAPS (TAMSULOSIN HCL) one by mouth qhs  #90 x 1   Entered by:   Mervin Kung CMA (AAMA)   Authorized by:   D. Thomos Lemons DO   Signed by:   Mervin Kung CMA (AAMA) on 07/11/2010   Method used:   Faxed to ...       [address] [facility] (mail-order)       [address]       [place], [place]  [address]       Ph: [phone]       Fax: [phone]   RxID:   [id number]

## 2010-11-21 NOTE — Letter (Signed)
Summary: [facility] Instructions  [facility]  [address] [place], [place] [address]   Phone: [phone]  Fax: [phone]       Justin [name]    [birth year]    MRN: [id number]        Procedure Day /Date:TUESDAY 12/10/2010     Arrival Time:9:30AM      Procedure Time:10:30AM     Location of Procedure:                    X   [facility] (4th Floor)   PREPARATION FOR COLONOSCOPY WITH MOVIPREP   Starting 5 days prior to your procedure 2/16  do not eat nuts, seeds, popcorn, corn, beans, peas,  salads, or any raw vegetables.  Do not take any fiber supplements (e.g. Metamucil, Citrucel, and Benefiber).  THE DAY BEFORE YOUR PROCEDURE         DATE: 12/09/2010   DAY: MONDAY  1.  Drink clear liquids the entire day-NO SOLID FOOD  2.  Do not drink anything colored red or purple.  Avoid juices with pulp.  No orange juice.  3.  Drink at least 64 oz. (8 glasses) of fluid/clear liquids during the day to prevent dehydration and help the prep work efficiently.  CLEAR LIQUIDS INCLUDE: Water Jello Ice Popsicles Tea (sugar ok, no milk/cream) Powdered fruit flavored drinks Coffee (sugar ok, no milk/cream) Gatorade Juice: apple, white grape, white cranberry  Lemonade Clear bullion, consomm, broth Carbonated beverages (any kind) Strained chicken noodle soup Hard Candy                             4.  In the morning, mix first dose of MoviPrep solution:    Empty 1 Pouch A and 1 Pouch B into the disposable container    Add lukewarm drinking water to the top line of the container. Mix to dissolve    Refrigerate (mixed solution should be used within 24 hrs)  5.  Begin drinking the prep at 5:00 p.m. The MoviPrep container is divided by 4 marks.   Every 15 minutes drink the solution down to the next mark (approximately 8 oz) until the full liter is complete.   6.  Follow completed prep with 16 oz of clear liquid of your choice (Nothing red or purple).   Continue to drink clear liquids until bedtime.  7.  Before going to bed, mix second dose of MoviPrep solution:    Empty 1 Pouch A and 1 Pouch B into the disposable container    Add lukewarm drinking water to the top line of the container. Mix to dissolve    Refrigerate  THE DAY OF YOUR PROCEDURE      DATE: 12/10/2010  DAY: TUESDAY  Beginning at5:30a.m. (5 hours before procedure):         1. Every 15 minutes, drink the solution down to the next mark (approx 8 oz) until the full liter is complete.  2. Follow completed prep with 16 oz. of clear liquid of your choice.    3. You may drink clear liquids until8:30AM  (2 HOURS BEFORE PROCEDURE).   MEDICATION INSTRUCTIONS  Unless otherwise instructed, you should take regular prescription medications with a small sip of water   as early as possible the morning of your procedure.         OTHER INSTRUCTIONS  You will need a responsible adult at least 57 years of age  to accompany you and drive you home.   This person must remain in the waiting room during your procedure.  Wear loose fitting clothing that is easily removed.  Leave jewelry and other valuables at home.  However, you may wish to bring a book to read or  an iPod/MP3 player to listen to music as you wait for your procedure to start.  Remove all body piercing jewelry and leave at home.  Total time from sign-in until discharge is approximately 2-3 hours.  You should go home directly after your procedure and rest.  You can resume normal activities the  day after your procedure.  The day of your procedure you should not:   Drive   Make legal decisions   Operate machinery   Drink alcohol   Return to work  You will receive specific instructions about eating, activities and medications before you leave.    The above instructions have been reviewed and explained to me by   _______________________    I fully understand and can verbalize these instructions  _____________________________ Date _________

## 2010-11-21 NOTE — Letter (Signed)
Summary: Primary Care Consult Scheduled Letter  [place] [facility]  [address]   [place], [place] [address]   Phone: [phone]  Fax: [phone]      10/29/2009 MRN: [id number]  Justin [name] [address] [place], [place]  [address]    Dear Mr. Arentz,      We have scheduled an appointment for you.  At the recommendation of Dr. Artist Pais, we have scheduled you a consult with Dr Hollice Espy, [facility]  on January 19th at 9:30am.  Their address [address] , [facility] . The office phone number is [phone].  If this appointment day and time is not convenient for you, please feel free to call the office of the doctor you are being referred to at the number listed above and reschedule the appointment.     It is important for you to keep your scheduled appointments. We are here to make sure you are given good patient care. If you have questions or you have made changes to your appointment, please notify us at  [phone], ask for [facility].    Thank you,  Patient Care Coordinator [place] at [facility]

## 2010-11-21 NOTE — Progress Notes (Signed)
Summary: Medication Refill  Phone Note Refill Request Message from:  Pharmacy on November 08, 2009 4:39 PM  Refills Requested: Medication #1:  TRAZODONE HCL 150 MG TABS one by mouth at bedtime prn   Dosage confirmed as above?Dosage Confirmed  Medication #2:  CIALIS 20 MG TABS one by mouth once daily as needed.   Dosage confirmed as above?Dosage Confirmed [employer] Home delivery   Initial call taken by: Michaelle Copas,  November 08, 2009 4:39 PM  Follow-up for Phone Call        refill x 3 Follow-up by: D. Thomos Lemons DO,  November 09, 2009 12:36 PM  Additional Follow-up for Phone Call Additional follow up Details #1::        Rx faxed to pharmacy Additional Follow-up by: Glendell Docker CMA,  November 09, 2009 4:29 PM    Prescriptions: CIALIS 20 MG TABS (TADALAFIL) one by mouth once daily as needed  #10 x 3   Entered by:   Glendell Docker CMA   Authorized by:   D. Thomos Lemons DO   Signed by:   Glendell Docker CMA on 11/09/2009   Method used:   Faxed to ...       [facility] (mail-order)       [address]       [place], [place]  [address]       Ph: [phone]       Fax: [phone]   RxID:   [id number] TRAZODONE HCL 150 MG TABS (TRAZODONE HCL) one by mouth at bedtime prn  #90 x 3   Entered by:   Glendell Docker CMA   Authorized by:   D. Thomos Lemons DO   Signed by:   Glendell Docker CMA on 11/09/2009   Method used:   Faxed to ...       [address] [facility] (mail-order)       [address]       [place], [place]  [address]       Ph: [phone]       Fax: [phone]   RxID:   [id number]

## 2010-11-21 NOTE — Assessment & Plan Note (Signed)
Summary: 6 MONTH FOLLOW UP/MHF   Vital Signs:  Patient profile:   55 year old male Height:      71 inches Weight:      180.75 pounds BMI:     25.30 O2 Sat:      96 % on Room air Temp:     97.9 degrees F oral Pulse rate:   66 / minute Resp:     18 per minute BP sitting:   100 / 70  (right arm) Cuff size:   large  Vitals Entered By: Glendell Docker CMA (September 25, 2010 7:58 AM)  O2 Flow:  Room air CC: 6 Month Follow up Is Patient Diabetic? No Pain Assessment Patient in pain? no      Comments printed rx's, and cream for nose, colonscopy, toe nails discolored , bilateral heel pain, rx for diazepam   Primary Care Provider:  Dondra Spry DO  CC:  6 Month Follow up.  History of Present Illness: 55 y/o male for routine f/u hx of rosasea - prev seen by Derm.  Dr. Virgina Norfolk used metrogel  chronic low back pain - stable.   use pain meds as needed  c/o bilateral heel pain  Preventive Screening-Counseling & Management  Alcohol-Tobacco     Smoking Status: quit  Allergies (verified): No Known Drug Allergies  Past History:  Past Medical History: colon cancer 2003 - specialist - due for repeat colonoscopy in 2012 Anxiety - takes approximately 10 diazepam / month - contracts to no more than that amount Depression   Hyperlipidemia  Benign prostatic hypertrophy - sees urology   overactive bladder 08/2008 patient fell off a roof about 13 feet onto concrete - multiple fractures including vertebral  Hx of kidney stones    Past Surgical History: hip repair elbow repair        Family History: CAD-father       Review of Systems       change in bowel habits  Physical Exam  General:  alert, well-developed, and well-nourished.   Lungs:  normal respiratory effort and normal breath sounds.   Heart:  normal rate, regular rhythm, and no gallop.   Abdomen:  soft, non-tender, normal bowel sounds, and no masses.   Extremities:  No lower extremity edema  Neurologic:   cranial nerves II-XII intact and gait normal.     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 One to two tabs by mouth two times a day as needed for back pain    Aspirin 81 Mg Tbec (Aspirin) ..... One by mouth once daily  Complete Medication List: 1)  Trazodone Hcl 150 Mg Tabs (Trazodone hcl) .... One by mouth at bedtime prn 2)  Hydrocodone-acetaminophen 5-500 Mg Tabs (Hydrocodone-acetaminophen) .... One to two tabs by mouth two times a day as needed for back pain 3)  Cialis 20 Mg Tabs (Tadalafil) .... One by mouth once daily as needed 4)  Aspirin 81 Mg Tbec (Aspirin) .... One by mouth once daily 5)  Diazepam 5 Mg Tabs (Diazepam) .... One by mouth at  bedtime as needed 6)  Metrogel 1 % Gel (Metronidazole) .... Apply two times a day as directed 7)  Cialis 5 Mg Tabs (Tadalafil) .... One by mouth once daily  Patient Instructions: 1)  Please schedule a follow-up appointment in 6 months. 2)  BMP prior to visit, ICD-9: 272.4 3)  Hepatic Panel prior to visit, ICD-9: 272.4 4)  Lipid Panel prior to visit, ICD-9: 272.4 5)  TSH prior to visit, ICD-9: 272.4 6)  CBC w/ Diff prior to visit, ICD-9: 272.4 7)  PSA - v76.44 8)  Please return for lab work one (1) week before your next appointment.  Prescriptions: CIALIS 20 MG TABS (TADALAFIL) one by mouth once daily as needed  #10 x 5   Entered and Authorized by:   D. Thomos Lemons DO   Signed by:   D. Thomos Lemons DO on 09/25/2010   Method used:   Print then  Give to Patient   RxID:   [id number] CIALIS 5 MG TABS (TADALAFIL) one by mouth once daily  #30 x 0   Entered and Authorized by:   D. Thomos Lemons DO   Signed by:   D. Thomos Lemons DO on 09/25/2010   Method used:   Print then Give to Patient   RxID:   [id number] METROGEL 1 % GEL (METRONIDAZOLE) apply two times a day as directed  #1 month x 1   Entered and Authorized by:   D. Thomos Lemons DO   Signed by:   D. Thomos Lemons DO on 09/25/2010   Method used:   [facility] to        [facility]  [address] [phone]* (retail)       [address]       [place], [place]  [address]       Ph: [phone] or [phone]       Fax: [phone]   RxID:   [id number] HYDROCODONE-ACETAMINOPHEN 5-500 MG TABS (HYDROCODONE-ACETAMINOPHEN) one to two tabs by mouth two times a day as needed for back pain  #60 x 3   Entered and Authorized by:   D. Thomos Lemons DO   Signed by:   D. Thomos Lemons DO on 09/25/2010   Method used:   Print then Give to Patient   RxID:   [id number] DIAZEPAM 5 MG TABS (DIAZEPAM) one by mouth at bedtime as needed  #30 x 1   Entered and Authorized by:   D. Thomos Lemons DO   Signed by:   D. Thomos Lemons DO on 09/25/2010   Method used:   Print then Give to Patient   RxID:   [id number]    Orders Added: 1)  Gastroenterology Referral [GI] 2)  Est. Patient Level III [[address]]   Immunization History:  Influenza Immunization History:    Influenza:  historical (06/12/2010)   Immunization History:  Influenza Immunization History:    Influenza:  Historical (06/12/2010)  Current Allergies (reviewed today): No known allergies

## 2010-11-21 NOTE — Consult Note (Signed)
Summary: [facility] [place]   Imported By: Lanelle Bal 11/15/2009 14:28:09  _____________________________________________________________________  External Attachment:    Type:   Image     Comment:   External Document

## 2010-11-21 NOTE — Assessment & Plan Note (Signed)
Summary: 6 month follow up/mhf   Vital Signs:  Patient profile:   55 year old Justin [name] Height:      71 inches Weight:      176.50 pounds BMI:     24.71 O2 Sat:      97 % on Room air Temp:     97.8 degrees F oral Pulse rate:   64 / minute Pulse rhythm:   regular Resp:     16 per minute BP sitting:   110 / 80  (right arm) Cuff size:   large  Vitals Entered By: Glendell Docker CMA (March 22, 2010 8:10 AM)  O2 Flow:  Room air CC: Rm 2- 6  Month Follow up Is Patient Diabetic? No   Primary Care Provider:  DThomos Lemons DO  CC:  Rm 2- 6  Month Follow up.  History of Present Illness: 55 y/o white Justin [name] for follow up pt seen by pain mgt/neurologist  gabapentin initiaited PT recommended. still takes hydrocodone as needed  consultant notes reviewed.  plan was to consider repeat of MRI of LS and ESI is worsening symptoms Pt reports too expensive to go back to pain mgt specialist/neurologist back back no worse  Preventive Screening-Counseling & Management  Alcohol-Tobacco     Smoking Status: quit  Allergies (verified): No Known Drug Allergies  Past History:  Past Medical History: colon cancer 2003 - specialist - due for repeat colonoscopy in 2012 Anxiety - takes approximately 10 diazepam / month - contracts to no more than that amount Depression Hyperlipidemia   Benign prostatic hypertrophy - sees urology   overactive bladder 08/2008 patient fell off a roof about 13 feet onto concrete - multiple fractures including vertebral - now in a wheelchair - Dr. Noralyn Pick - hopes to get out of the wheelchair in another month  Family History: CAD-father    Brother diagnosed with CAD - s/p stents  Physical Exam  General:  alert, well-developed, and well-nourished.   Lungs:  normal respiratory effort and normal breath sounds.   Heart:  normal rate, regular rhythm, and no gallop.   Neurologic:  cranial nerves II-XII intact and gait normal.     Impression & Recommendations:  Problem #  1:  BACK PAIN, CHRONIC (ICD-724.5) Assessment Unchanged Back pain stable.  seen by pain mgt.  specialist visit cost prohibitive.  reviewed pain mgt contact.  Pt agrees.  use hydrocodone as needed.  continue gabapentin  His updated medication list for this problem includes:    Hydrocodone-acetaminophen 5-500 Mg Tabs (Hydrocodone-acetaminophen) ..... One to two tabs by mouth two times a day as needed for back pain    Aspirin 81 Mg Tbec (Aspirin) ..... One by mouth once daily  Complete Medication List: 1)  Trazodone Hcl 150 Mg Tabs (Trazodone hcl) .... One by mouth at bedtime prn 2)  Hydrocodone-acetaminophen 5-500 Mg Tabs (Hydrocodone-acetaminophen) .... One to two tabs by mouth two times a day as needed for back pain 3)  Cialis 20 Mg Tabs (Tadalafil) .... One by mouth once daily as needed 4)  Gabapentin 300 Mg Caps (Gabapentin) .... One by mouth two times a day 5)  Aspirin 81 Mg Tbec (Aspirin) .... One by mouth once daily  Patient Instructions: 1)  Please schedule a follow-up appointment in 6 months 2)  Take fish oil supplement daily 3)  Avoid saturated fats 4)  High sensitivity CRP :  272.4 5)  Lab work before next office visit Prescriptions: HYDROCODONE-ACETAMINOPHEN 5-500 MG TABS (HYDROCODONE-ACETAMINOPHEN) one to  two tabs by mouth two times a day as needed for back pain  #60 x 2   Entered and Authorized by:   D. Thomos Lemons DO   Signed by:   D. Thomos Lemons DO on 03/22/2010   Method used:   Print then Give to Patient   RxID:   [id number] GABAPENTIN 300 MG CAPS (GABAPENTIN) one by mouth two times a day  #60 x 3   Entered and Authorized by:   D. Thomos Lemons DO   Signed by:   D. Thomos Lemons DO on 03/22/2010   Method used:   [facility]        [facility]  [address] [phone][facility]ail[facility]       [address]       [place], [place]  [address]       Ph: [phone] or [phone]       Fax: [phone]   RxID:   [id number]     Current Allergies (reviewed  today): No known allergies

## 2010-12-10 ENCOUNTER — Encounter: Payer: Self-pay | Admitting: Gastroenterology

## 2010-12-10 ENCOUNTER — Other Ambulatory Visit (AMBULATORY_SURGERY_CENTER): Payer: Managed Care, Other (non HMO) | Admitting: Gastroenterology

## 2010-12-10 DIAGNOSIS — K5730 Diverticulosis of large intestine without perforation or abscess without bleeding: Secondary | ICD-10-CM

## 2010-12-10 DIAGNOSIS — Z85038 Personal history of other malignant neoplasm of large intestine: Secondary | ICD-10-CM

## 2010-12-10 DIAGNOSIS — Z1211 Encounter for screening for malignant neoplasm of colon: Secondary | ICD-10-CM

## 2010-12-10 LAB — HM COLONOSCOPY

## 2010-12-17 NOTE — Procedures (Signed)
Summary: Colonoscopy  Patient: Justin [name] Note: All result statuses are Final unless otherwise noted.  Tests: (1) Colonoscopy (COL)   COL Colonoscopy           DONE (C)     [facility]     [address]     [place], [place]  [address]           COLONOSCOPY PROCEDURE REPORT           PATIENT:  Justin, [name]  MR#:  [id number]     BIRTHDATE:  [birth year], 54 yrs. old  GENDER:  male           ENDOSCOPIST:  Barbette Hair. Arlyce Dice, MD     Referred by:  Thomos Lemons, DO           PROCEDURE DATE:  12/10/2010     PROCEDURE:  Diagnostic Colonoscopy     ASA CLASS:  Class II     INDICATIONS:  1) surveillance  2) history of colon cancer Sigmoid     resection 2003           MEDICATIONS:   Fentanyl 75 mcg IV, Versed 8 mg IV, benadryl 12.5mg      IV           DESCRIPTION OF PROCEDURE:   After the risks benefits and     alternatives of the procedure were thoroughly explained, informed     consent was obtained.  No rectal exam performed. The [id number]     endoscope was introduced through the anus and advanced to the     cecum, which was identified by the ileocecal valve, without     limitations.  The quality of the prep was excellent, using     MoviPrep.  The instrument was then slowly withdrawn as the colon     was fully examined.     <<PROCEDUREIMAGES>>           FINDINGS:  Scattered diverticula were found. Diffuse     diverticulosis from sigmoid to cecum (see image7 and image12).     This was otherwise a normal examination of the colon (see image4,     image6, image9, image10, image14, and image15).   Retroflexed     views in the rectum revealed Unable to retroflex.    The time to     cecum =  1.5  minutes. The scope was then withdrawn (time =  6.0     min) from the patient and the procedure completed.           COMPLICATIONS:  None           ENDOSCOPIC IMPRESSION:     1) Diverticula, scattered     2) Otherwise normal examination     RECOMMENDATIONS:     1) Colonoscopy  5 years           REPEAT EXAM:   5 year(s) Colonoscopy           ______________________________     Barbette Hair. Arlyce Dice, MD           CC:           n.     REVISED:  12/10/2010 12:10 PM     eSIGNED:   Barbette Hair. Kaplan at 12/10/2010 12:10 PM           Odie Sera, [id number]  Note: An exclamation mark (!) indicates a result that was not dispersed into the flowsheet. Document Creation Date: 12/10/2010  12:10 PM _______________________________________________________________________  (1) Order result status: Final Collection or observation date-time: 12/10/2010 11:52 Requested date-time:  Receipt date-time:  Reported date-time:  Referring Physician:   Ordering Physician: Melvia Heaps [phone]) Specimen Source:  Source: Launa Grill Order Number: [id number] Lab site:   Appended Document: Colonoscopy    Clinical Lists Changes  Observations: Added new observation of COLONNXTDUE: 11/2015 (12/11/2010 8:05)

## 2010-12-18 ENCOUNTER — Telehealth: Payer: Self-pay | Admitting: Internal Medicine

## 2010-12-26 NOTE — Progress Notes (Signed)
Summary: Trazadone Refill  Phone Note Refill Request Message from:  Fax from Pharmacy on December 18, 2010 11:54 AM  Refills Requested: Medication #1:  TRAZODONE HCL 150 MG TABS one by mouth at bedtime prn   Brand Name Necessary? Yes   Supply Requested: 3 months refill request form [facility] for Trazadone   Method Requested: Fax to Mail Away Pharmacy Next Appointment Scheduled: 03/24/2009 @ 8am Dr Artist Pais Initial call taken by: Glendell Docker CMA,  December 18, 2010 12:02 PM  Follow-up for Phone Call        okay to refill x3 Follow-up by: D. Thomos Lemons DO,  December 19, 2010 3:39 PM  Additional Follow-up for Phone Call Additional follow up Details #1::        Rx faxed to pharmacy Additional Follow-up by: Glendell Docker CMA,  December 19, 2010 3:43 PM    Prescriptions: TRAZODONE HCL 150 MG TABS (TRAZODONE HCL) one by mouth at bedtime prn  #90 x 3   Entered by:   Glendell Docker CMA   Authorized by:   D. Thomos Lemons DO   Signed by:   Glendell Docker CMA on 12/19/2010   Method used:   Faxed to ...       [address] [facility] (mail-order)       [address]       [place], [place]  [address]       Ph: [phone]       Fax: [phone]   RxID:   [id number]

## 2011-02-20 ENCOUNTER — Other Ambulatory Visit: Payer: Self-pay | Admitting: *Deleted

## 2011-02-20 NOTE — Telephone Encounter (Signed)
Received refill request from [facility] for refill of Gabapentin. Pt verified that he wants refill sent to mail order service. Please advise re: refills.

## 2011-02-28 ENCOUNTER — Other Ambulatory Visit: Payer: Self-pay | Admitting: *Deleted

## 2011-02-28 NOTE — Telephone Encounter (Signed)
Refill request on Gabapentin 300 mg cap three times a day 90 day supply requested

## 2011-03-02 NOTE — Telephone Encounter (Signed)
Ok to refill x 3 

## 2011-03-03 MED ORDER — GABAPENTIN 300 MG PO CAPS: 300.0000 mg | ORAL_CAPSULE | Freq: Three times a day (TID) | ORAL | Status: DC

## 2011-03-03 NOTE — Telephone Encounter (Signed)
Rx refill sent to pharmacy. 

## 2011-03-04 NOTE — Consult Note (Signed)
NAMELINZY, DARLING             ACCOUNT NO.:  [id number]   MEDICAL RECORD NO.:  [id number]          PATIENT TYPE:  EMS   LOCATION:  MAJO                         FACILITY:  [facility]   PHYSICIAN:  Cristi Loron, M.D.DATE OF BIRTH:  30-Mar-1956   DATE OF CONSULTATION:  09/14/2008  DATE OF DISCHARGE:                                 CONSULTATION   BRIEF HISTORY:  The patient is a 55 year old white male who was putting  some sealant on the roof this afternoon when he fell off falling  approximately 12 feet onto concrete.  He had the immediate onset of back  pain, arm pain, and pelvis pain.  He used a cell phone to call EMS and  was transported to [facility] where he was evaluated by the  emergency department staff and the trauma service.  The evaluation  diagnosed a right upper extremity fracture, a pelvis fracture, and L1  compression fracture.  A neurosurgical consultation was requested by Dr.  Excell Seltzer.   Presently, the patient complains of the above pain.  He notes some  tingling in his bilateral lower extremities, right greater than left.  He denies perineal numbness.  He has not noticed any weakness.  The  patient complains of pain at the thoracolumbar junction.  The patient  denies neck pain, upper extremity numbness, tingling, or weakness.  There was no loss of consciousness with the fall.  He has had no  seizures, nausea, or vomiting.   PAST MEDICAL HISTORY:  1. Positive for colon cancer with no known metastasis.  2. Depression.  3. Inguinal hernia.  4. Hyperactive bladder.   PAST SURGICAL HISTORY:  Colon resection and herniorrhaphy.   MEDICATIONS PRIOR TO ADMISSION:  He does not know the names, but he  knows he takes an antidepressant, and trazodone sleep.   ALLERGIES:  He has known drug allergies.   FAMILY MEDICAL HISTORY:  The patient's mother is aged 86 in good health.  The patient's father is aged 34 with heart disease.   SOCIAL HISTORY:  The patient is  single.  He has no children.  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  He lives in [place].  He denies tobacco, ethanol, or drug use.   REVIEW OF SYSTEMS:  Negative except as above.  He denies chest pain,  abdominal pain, shortness of breath, headaches, etc.   PHYSICAL EXAMINATION:  GENERAL:  A pleasant 56 year old traumatized  white male.  VITAL SIGNS:  Blood pressure 115/73, heart rate 67, respiratory rate 18,  temperature 98.5 degrees Fahrenheit orally.  HEENT:  Normocephalic and atraumatic.  His pupils are equal, round, and  reactive to light.  Extraocular muscles intact.  Oropharynx benign.  Uvula midline.  There is no evidence of CSF, otorrhea, or rhinorrhea.  NECK:  Supple with no masses, deformities, or tracheal deviation.  He  has a normal range of motion.  Spurling testing is negative.  Lhermitte  signs are present.  Thorax symmetric.  ABDOMEN:  Soft.  EXTREMITIES:  His right extremity is in a sling.  BACK:  Diffusely tender.  No  obvious deformities.  NEUROLOGIC:  The patient is alert and oriented x3.  Glasgow coma scale  15.  Cranial nerves II through XII were examined bilaterally and grossly  normal.  Vision and hearing grossly normal bilaterally.  Motor strength  is 5/5 with left hand grip, biceps, and triceps.  He has a limited exam  of his right upper extremity because of his fracture, but he appears to  have grossly normal strength.  His lower extremity strength is 5/5 in  his bilateral quadriceps, gastrocnemius, and extensor hallucis longus.  Sensory function is grossly intact to light touch sensation in all  tested dermatomes bilaterally.  Cerebellar function is intact to rapid  alternating movements of the upper and lower extremities bilaterally.  Deep tendon reflexes are 1/4 in his left biceps, triceps and 2/4 in  bilateral quadriceps, gastrocnemius.  There is no ankle clonus.   IMAGING STUDIES:  I have reviewed the patient's cervical CT scan   performed without contrast at [facility] today.  It is  unremarkable except for some mild degenerative changes.   I have also reviewed the patient's lumbar CT.  It demonstrates the  patient has an L1 compression fracture with a two-column injury.  There  is about 50% loss of anterior vertebral body height.  He has  approximately 6-mm retropulsion of bone into the spinal canal with about  20% canal compromise.  He appears to have an old T10 healed minimal  compression fracture.   ASSESSMENT AND PLAN:  L1 compression fracture.  I have discussed the  situation with the patient.  I told him that the best treatment plan  depends on the treatment of his pelvis fractures.  I have told him that  if he is going to need prolonged immobilization because of the pelvis  fracture then he likely will not need back surgery.  On the other hand,  if he is able to mobilize/ambulate quickly after his pelvic is fixed,  then we should consider a L1 retroperitoneal corpectomy with  instrumentation and fusion from T12 to L2 in order to allow him to  ambulate quicker.  We did discuss alternative treatment and orthoses.  I  have answered all the questions.  We will see with the Orthopedics  surgeon has in mind for his other fractures before determining our  definitive treatment plan.  The patient needs to be at bedrest with head  of bed less than 20 degrees and logroll only.      Cristi Loron, M.D.  Electronically Signed     JDJ/MEDQ  D:  09/14/2008  T:  09/14/2008  Job:  [id number]

## 2011-03-04 NOTE — Consult Note (Signed)
NAMELAWTON, DOLLINGER NO.:  [id number]   MEDICAL RECORD NO.:  [id number]          PATIENT TYPE:  EMS   LOCATION:  MAJO                         FACILITY:  [facility]   PHYSICIAN:  Ardeth Sportsman, MD     DATE OF BIRTH:  [birth year]   DATE OF CONSULTATION:  DATE OF DISCHARGE:                                 CONSULTATION   PRIMARY CARE PHYSICIAN:  Mosetta Putt, MD   REQUESTING PHYSICIAN:  Billee Cashing, MD   REASON FOR CONSULT:  Fall with complicated hip, lumbar and elbow  fracture.   HISTORY OF PRESENT ILLNESS:  Mr. Justin [name] is a 55 year old male who was  working on his roof when he fell off about 15 feet and landed on his  right side.  He complained of elbow and hip pain.  He came in not as a  trauma code.  There was no loss of consciousness.  He denied any neck  pain, but he did have some right hip and elbow pain.  He was  hemodynamically stable at the scene.  He was initially evaluated by Dr.  Rubin Payor and based on his concern about obvious deformity and  fractures, a trauma consultation was made.  He was in the operating room  initially, but when he came out, I was able to evaluate him.   He has been evaluated by Neurosurgery and Orthopedics as well and given  the complexity of his hip fractures, he is being considered for transfer  to [facility].   PAST MEDICAL HISTORY:  1. Colon cancer.  2. 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      He got chemotherapy, followed by Dr. Mancel Bale, and has been      disease-free for 5 years.  3. Recurrent inguinal hernias, most recently status post inguinal      hernia repair by Dr. Ovidio Kin as well.  4. Liver disease.  5. Bilateral renal calculi diagnosed today with no prior history of      any symptoms.   PAST SURGICAL HISTORY:  Noted above.   SOCIAL HISTORY:  He denies tobacco, alcohol, or drug use.   ALLERGIES:  None.   MEDICATIONS:   Doxycycline and trazodone.  There are some other  medications that he is on, he cannot easily recall.   FAMILY HISTORY:  No history of any syncope or neurological disorders  that he can recall.  No history of hypoglycemia or diabetes that he can  recall.   REVIEW OF SYSTEMS:  As noted per HPI.  GENERAL:  No fevers, chills, or  sweats.  No weight gain or weight loss.  Eyes and ENT is negative.  Respiratory and cardiac is negative.  Hepatic, renal, and endocrine is  negative.  Heme, lymph, allergic, and breasts is negative.  Abdomen is  negative.  Denies any abdominal pain.  No nausea or vomiting.  No  discomfort at all.  GU is negative.  Testicular is negative.  Musculoskeletal:  He has right elbow pain and swelling and right hip  pain and swelling.  Neurological:  No focal deficits that he can sense.  No history of tremors or risk falls.  Skin:  Some bruising, but  otherwise no new sores, lesions, or any other abnormalities.   PHYSICAL EXAMINATION:  VITAL SIGNS:  Temperature is 98.5, pulse has been  in the 60s and most recently 67, respirations 18, blood pressure has  been in the 100s, initially 115/73.  He has 10/10 pain.  He is more down  to around 4/10 pain with help of some narcotics.  He is sating fine on  room air.  He is sating in the high 90s on room air.  GENERAL:  He is a well-developed, well-nourished male, close to ideal  body weight, in no acute distress.  EYES:  Pupils, equal, round, and reactive to light.  Extraocular  movements, intact.  Sclerae anicteric and injected.  PSYCH:  He has no evidence of any dementia, delirium, psychosis, or  paranoia, seems to have at least average intelligence.  NEUROLOGICAL:  Cranial nerves II through XII are intact.  GCS is 15.  No  sensory or motor deficits.  No complaints of paresthesias or weaknesses  or paresis.  ENT:  He is normocephalic.  No raccoon eyes or Battle sign.  Nasopharynx  and oropharynx are clear.  There is no step  off.  Tympanic membranes are  clear.  Mandible sign without step off.  NECK:  He is already out of C-collar, but denies any pain.  No pain on  palpation on the spine.  Normoactive.  Full range of motion of the  spine.  C-spine is clear.  CHEST:  Clear to auscultation bilaterally.  No wheezes, rales, or  rhonchi.  No pain to rib or sternal compression.  HEART:  Regular rate and rhythm.  Normal clicks or rubs.  LYMPH NODE:  Head, neck, axillary, and groin lymphadenopathy.  BREASTS:  No sores or lesions.  No nipple masses.  ABDOMEN:  Completely soft, nontender, and nondistended.  He has prior  groin incisions, but there is no evidence of any inguinal or umbilical  hernias.  GENITAL:  Normal external male genitalia.  Testes distended bilaterally.  No inguinal hernias.  RECTAL:  Reportedly heme negative with normal sphincter tone.  PELVIS:  Not stable, is very painful with some ecchymosis on the right  side.  MUSCULOSKELETAL:  Full range of motion of his bilateral shoulders, left  elbow and bilateral wrist as well as left hip, left knee, left ankle,  and right knee and right ankle.  However, he did not do too much ranging  on his right hip based on his x-ray findings.  His right elbow, he does  have a little bit range of motion, but he is held to 90 degrees and he  rather has some ecchymosis there.  NEUROVASCULAR:  Distal neurovascular intact with normal radial and  dorsalis pedis pulses.  No carotid bruits.  SKIN:  Ecchymosis around the right elbow and right hip.  No major  lacerations or abrasions are strongly apparent.  BACK:  Some mild pain in his lumbar spine.   LABORATORY STUDIES:  Hemoglobin is 15.3, electrolytes normal except  creatinine 1.3 slightly elevated, potassium is normal.  Chest x-ray  shows some mild interstitial coarsening, but no evidence of any rib  fractures, pneumothorax, or hemothorax.  Pelvis was concerning for  complicated right pelvic fracture.  Right elbow  film shows olecranon  ulnar fracture with dislocation.  CT of the neck is negative.  CT of the  thoracic spine is pending.  Preliminary seemed to be negative, CT of the  abdomen and pelvis showed L1 bursa fracture with about 6-mm  retropulsion.  Fortunately, he has a generous central spinal canal.  There is about 50% loss of height especially anteriorly.  His right-  sided acetabular fracture is involving the iliac wing and anterior-  posterior walls and rami as well.  Interestingly, his femoral head and  neck appeared to be okay.  The left side of his pelvis appears to be  stable.  He does have bilateral kidney stones, but no evidence of any  hydronephrosis.  He has benign liver cyst.  There is no evidence of  likely nerve contusion or splenic injury.  There is no free fluid.  He  does have no evidence of metastatic disease.   ASSESSMENT AND PLAN:  A 55 year old male with fall with numerous  injuries.  1. L1 bursa fracture, Dr. Lovell Sheehan in Neurosurgery followed and      evaluated.  Perhaps could be managed with ruled out surgery      according to him although it may be adding toward surgery.  He will      defer it to since it seems like [facility] needs to be involved in      his care.  2. Major right acetabular fracture.  Our expert, Dr. Jerl Santos,      consulted with his trauma orthopedic surgeon who felt that given      the complexity with coordinated lumbar fracture as well, that a      transfer to a tertiary referral center would be best for him.  The      patient is interested in this and this is being arranged.  3. Followup on thoracic CT films.  He was not able to position for      plain films in retropulsion.  4. Kidney stones.  No evidence of injury, follow expectantly.  5. No evidence of any chest or intraabdominal injury.  I think he is      stable for transfer to [facility].  He      does not require any other major evaluation, although I would       assume that the Trauma Service will help be the primary service to      help to coordinate the specialty care.  6. Right elbow fracture.  In splint for right now.  Distal      neurovascular is intact.  There is no strong evidence that this is      an open fracture and therefore will be followed up later.  7. Colon cancer.  No evidence of any acute disease, defer to Dr.      Truett Perna for any further intervention needs to be done at a later      time.      Ardeth Sportsman, MD  Electronically Signed     SCG/MEDQ  D:  09/14/2008  T:  09/15/2008  Job:  [id number]

## 2011-03-04 NOTE — Op Note (Signed)
NAMEMANDELL, Justin [name]             ACCOUNT NO.:  [id number]   MEDICAL RECORD NO.:  [id number]          PATIENT TYPE:  AMB   LOCATION:  ENDO                         FACILITY:  [facility]   PHYSICIAN:  Sandria Bales. Ezzard Standing, M.D.  DATE OF BIRTH:  05/03/1956   DATE OF PROCEDURE:  07/14/2008  DATE OF DISCHARGE:                               OPERATIVE REPORT   Date of operation ??   PREOPERATIVE DIAGNOSES:  History of rectal carcinoma.   POSTOPERATIVE DIAGNOSES:  Scattered pancolonic diverticula with normal  anastomosis, approximately 11 cm from the anal verge.   PROCEDURE:  Colonoscopy.   SURGEON:  Sandria Bales. Ezzard Standing, M.D.   FIRST ASSISTANT:  None.   ANESTHESIA:  Fentanyl 60 mcg and 6 mg of Versed.   COMPLICATIONS:  None.   INDICATIONS FOR PROCEDURE:  Justin [name] is a 55 year old white male who  has had a low anterior resection for a rectal carcinoma in May of 2003.  He had a T3, N1 carcinoma, underwent adjuvant therapy by Dr. Mancel Bale and radiation therapy by Dr. Jackelyn Knife.  He has done well since  that time with no evidence of recurrence.   He has noticed a slight change in his bowel habits.  He has had some  trouble, where at night he an urgency to go to the bathroom for more  bowel movements.   PROCEDURE NOTE:  The patient completed a mechanical bowel prep at home.  He was placed in the left lateral decubitus position with an IV in his  right arm.   He was placed on telemetry, had 2 L nasal O2 flowing, had a blood  pressure cuff and was placed on pulse oximetry.   He was given 60 mcg of fentanyl, 6 mg of Versed and a flexible Pentax  pediatric colonoscope was passed up his rectum.  I was able to advance  this around to his ileocecal valve.   I identified the ileocecal valve, appendiceal orifice and took photos of  this, which I put in the chart.  His right colon, transverse colon and  left colon were unremarkable for mucosal lesion.  He did have some  scattered diverticula  involving all segments of his colon.  On  withdrawing the scope, he had some neovascularization, which started  maybe at about 15-20 cm from the anal verge.  At about 11-12 cm, I  identified his staple line and anastomosis.   He has some bowel complaints which could lead one to think that he has  some kind of stenosis of his colon.  I did not see any evidence of a  stenosis at the anastomosis.  I got the scope easily through it and he  had some neovascularization of his high rectum.  The scope was withdrawn  into the anal canal, which was unremarkable.  I did a digital exam,  which I felt nothing.  I could actually feel the anastomosis with my  finger.   The patient has a negative colonoscopy, except for the scattered  diverticulosis.  Since he is now more than five years out from his  original surgery,  I think his next colonoscopy could be in five years  from now and discussed this with him and his mother, who was with him at  the time of the procedure.      Sandria Bales. Ezzard Standing, M.D.  Electronically Signed     DHN/MEDQ  D:  07/14/2008  T:  07/14/2008  Job:  [id number]   cc:   Justin [name], M.D.  Fax: [phone]   Justin [name], M.D.  Fax: [phone]

## 2011-03-07 NOTE — Consult Note (Signed)
[facility]  Patient:    Justin [name], Justin [name] Visit Number: [id number] MRN: [id number]          Service Type: SUR Location: 3W [id number] 01 Attending Physician:  Andre Lefort Dictated by:   Lorette Ang, N.P. Proc. Date: 03/16/02 Admit Date:  03/09/2002 Discharge Date: 03/16/2002   CC:         Justin [name], M.D.  Meredith Staggers, M.D.   Consultation Report  DATE OF BIRTH:  November 04, 1955  REASON FOR CONSULTATION:  Newly diagnosed rectal cancer.  REFERRING PHYSICIAN:  Sandria Bales. Ezzard [name], M.D.  HISTORY OF PRESENT ILLNESS:  Justin [name] is a 55 year old gentleman who presented with rectal bleeding and change in bowel habits and was found to have a tumor approximately 13-14 cm from the anal verge by colonoscopy Feb 24, 2002.  Abdominal/pelvic CTs on Feb 28, 2002 showed no evidence of metastatic disease.  On Mar 09, 2002 patient underwent low anterior resection by Dr. Ezzard [name] with final pathology [id number]) confirming invasive adenocarcinoma of the rectosigmoid colon with negative margins, invasion through the muscularis propria into pericolonic adipose tissue, vascular/lymphatic invasion, and one of 10 positive lymph nodes.  Oncology consultation was requested for further evaluation.  PAST MEDICAL HISTORY: 1. Insomnia. 2. Rosacea. 3. Multiple bilateral inguinal hernia repairs.  CURRENT MEDICATIONS:  Heparin 5000 units subcutaneously q.12h.  ALLERGIES:  No known drug allergies.  FAMILY HISTORY:  Mother is healthy.  Father is deceased secondary to heart disease.  Brother and sister both reported to be healthy.  SOCIAL HISTORY:  Justin [name] lives in [place], [place].  He is separated.  He has no children.  He is employed in Radio broadcast assistant.  He has a positive tobacco history stating that he quit smoking approximately two and a half years ago at two packs per day for 20 years.  He denies any ETOH  use.  His girlfriend is Justin [name] and she can be reached at [phone] or [phone].  REVIEW OF SYSTEMS:  Patient denies any weight loss.  His appetite has been good.  He reports his energy level has also been good.  He denies any fevers. He has had no unusual headaches or changes in his vision.  He denies any shortness of breath or cough.  He has had no chest pain.  He denies any peripheral edema.  He does report a recent change in his bowel habits with constipation.  He reports intermittent rectal bleeding over the past two years.  He denies any melena.  He has had no hematuria or dysuria.  He does report difficulty initiating his urine stream as well as nocturia.  PHYSICAL EXAMINATION  VITAL SIGNS:  Temperature 96.9, heart rate 65, respirations 18, blood pressure 142/72, height 72 inches, weight 177 pounds.  GENERAL:  Well-nourished Caucasian male in no acute distress.  HEENT:  Normocephalic, atraumatic.  Pupils are equal, round, and reactive to light.  Extraocular movements are intact.  Sclerae anicteric.  Oropharynx is clear.  LYMPH:  No palpable cervical, supraclavicular, axillary, or inguinal lymph nodes.  CHEST:  Lungs are clear anteriorly.  CARDIOVASCULAR:  Regular rate and rhythm.  ABDOMEN:  Soft.  Midline incision intact with staples.  EXTREMITIES:  No clubbing, cyanosis, edema.  NEUROLOGIC:  Alert and oriented x3.  Motor strength 5/5.  LABORATORY DATA:  Hemoglobin 13.4, white count 13.3, platelets 218,000. Sodium 133, potassium 4.6, BUN 9, creatinine 0.8, glucose 126, calcium 8.6. Preoperative laboratory work:  CEA 1.9, sodium  139, potassium 3.8, BUN 19, creatinine 1.0, glucose 100, total bilirubin 1.0, alkaline phosphatase 81, SGOT 23, SGPT 17, total protein 6.6, albumin 3.9, calcium 9.1.  Hemoglobin 15.2, white count 5.9, platelets 221,000.  CT of the abdomen Feb 28, 2002:  Lung bases are clear.  No pleural effusion. A 1.4 cm simple cyst anteriorly in the left  hepatic lobe.  No suspicious liver lesions are demonstrated.  There is a 3.8 x 3.1 cm cyst posteriorly in the upper pole of the right kidney.  Kidneys otherwise appear normal.  The gallbladder, adrenal glands, pancreas, and spleen appear normal.  There is no lymphadenopathy, ascites, or focal extraluminal fluid collection. Diverticular changes of the descending colon are present without surrounding inflammatory change.  CT of the pelvis Feb 28, 2002:  Postoperative changes noted in the left anterior abdominal wall and inguinal region.  Small amount of fluid remains in the left inguinal canal and there is soft tissue stranding throughout the subcutaneous and mesenteric fat in the left lower quadrant. There is sigmoid diverticular change with an asymmetric soft tissue component in the sigmoid colon which may reflect the patients new malignancy. Alternatively, there is a possible area of narrowing of the sigmoid lumen more distally on images 72-74 which may reflect the patients tumor.  There is no obvious extension of tumor beyond the adventitia and there is no adenopathy or focal extraluminal fluid collection.  Seminal vesicles and prostate gland appear unremarkable.  Chest x-ray Feb 24, 2002:  No active disease.  IMPRESSION AND PLAN:  Justin [name] is a 55 year old gentleman status post low anterior resection of a stage III (T3 N1) rectal cancer.  Dr. Truett Perna discussed the pathology report with Justin [name] and his girlfriend.  He reviewed the survival benefit associated with chemotherapy and radiation in this setting.  We will recommend adjuvant 5-FU/leucovorin for two cycles to be followed by 5-FU plus radiation.  We will schedule Justin [name] a follow-up appointment in the next two weeks and coordinate adjuvant therapy with Dr. Dorna Bloom.  Patient will benefit from placement of a Port-A-Cath.  Patient seen and examined by Dr. Truett Perna.  Dictated by:   Lorette Ang, N.P. Attending  Physician:  Andre Lefort DD:  03/16/02 TD:  03/17/02 Job: [id number]

## 2011-03-07 NOTE — Op Note (Signed)
NAME:  Justin [name], Justin [name]                       ACCOUNT NO.:  [id number]   MEDICAL RECORD NO.:  [id number]                   PATIENT TYPE:  AMB   LOCATION:  ENDO                                 FACILITY:  [facility]   PHYSICIAN:  Sandria Bales. Ezzard Standing, M.D.               DATE OF BIRTH:  Mar 13, 1956   DATE OF PROCEDURE:  02/24/2003  DATE OF DISCHARGE:                                 OPERATIVE REPORT   PREOPERATIVE DIAGNOSIS:  History of high rectal carcinoma.   POSTOPERATIVE DIAGNOSES:  Scattered sigmoid diverticula and normal  anastomosis at 11 to 12 cm from the anal verge.   PROCEDURE:  Colonoscopy.   SURGEON:  Sandria Bales. Ezzard Standing, M.D.   FIRST ASSISTANT:  None.   ANESTHESIA:  75 mg Demerol, 8 mg Versed.   COMPLICATIONS:  None.   INDICATIONS FOR PROCEDURE:  [name] is a 55 year old male who I did a  low anterior resection on approximately one year ago. He had a 4.5 cm  carcinoma which was a T3 and had 1/10 nodes which was an N1. He underwent  adjuvant chemotherapy by Dr. Mancel Bale and radiation therapy by Dr.  Jackelyn Knife.  He has done well since that time. On his last visit with me, he  did have trace guaiac positive stools. He also had a CT scan which has seen  a tiny right liver nodule whose etiology is unclear is unclear at this time,  but I think it is time for his followup colonoscopy.   DESCRIPTION OF PROCEDURE:  The patient was placed in a left lateral  decubitus position, he had an IV in his right wrist, was on telemetry on  nasal O2 and placed on telemetry. He was given 8 mg Versed, 75 mg Demerol at  the initiation of the procedure and though he never went totally to sleep he  got a little bit drowsy. I passed an Olympus colonoscope without difficulty  around to the ileocecal valve, identified the ileocecal valve, appendiceal  orifice, right colon, transverse colon, left colon were all normal and  unremarkable for mass or lesion. The sigmoid colon had some scattered  diverticula but no mass or lesion. His anastomosis was 11-12 cm from the  anal verge. There was some thickening of the bowel in that area but I think  it would be consistent with his prior surgery and radiation. The scope was  retroflexed within the anal canal and this was unremarkable.   He had a normal colonoscopy. Would recommend next colonoscopy in two years  time. He will see me back just for routine followup in about three months  and he has an appointment with Dr. Truett Perna for followup CT scan in about  three months.  Sandria Bales. Ezzard Standing, M.D.    DHN/MEDQ  D:  02/24/2003  T:  02/24/2003  Job:  [id number]   cc:   Jillyn Hidden B. Truett Perna, M.D.  [address]- [facility]  [place]  [place]  [address]  Fax: [phone]   Elmer Sow. Dorna Bloom, M.D.  [address] - [facility]  [place]  [place]  [address]  Fax: [phone]

## 2011-03-07 NOTE — Op Note (Signed)
NAMEEDOUARD, Justin [name]             ACCOUNT NO.:  [id number]   MEDICAL RECORD NO.:  [id number]          PATIENT TYPE:  AMB   LOCATION:  ENDO                         FACILITY:  [facility]   PHYSICIAN:  Sandria Bales. Ezzard Standing, M.D.  DATE OF BIRTH:  [birth year]   DATE OF PROCEDURE:  04/01/2005  DATE OF DISCHARGE:                                 OPERATIVE REPORT   PREOPERATIVE DIAGNOSIS:  History of high rectal carcinoma.   POSTOPERATIVE DIAGNOSIS:  Scattered pancolonic diverticula with normal  anastomosis approximately 11 cm from the anal verge.   PROCEDURE:  Colonoscopy.   SURGEON:  Sandria Bales. Ezzard Standing, M.D.   FIRST ASSISTANT:  None.   ANESTHESIA:  50 mg Demerol and 8 mg Versed.   COMPLICATIONS:  None.   INDICATIONS FOR PROCEDURE:  [name] is a 55 year old white male who had  a low anterior resection in May 2003 for a T3N1 carcinoma.  He underwent  adjuvant therapy by Dr. Mancel Bale and radiation therapy by Dr. Jackelyn Knife.  He has done well since that time with no evidence of recurrent disease.  He now comes for follow up colonoscopy.   DESCRIPTION OF PROCEDURE:  The patient was placed in the left lateral  decubitus position, had an IV in his right wrist.  He was on telemetry, had  nasal O2, had a blood pressure cuff and pulse oximetry.  He was given  initially 50 mg Demerol and 8 mg Versed at the initiation of the procedure  and he got a little drowsy but really never went to sleep, actually, the  second half of the procedure, was awake, talking, and seemed coherent.  He  tolerated the procedure exceptionally well.   I passed the flexible Olympus colonoscope without difficulty around to his  ileocecal valve.  I identified the ileocecal valve, appendiceal orifice, and  could transluminate the cecum in the right lower quadrant.  He did have  scattered diverticula in the right colon, transverse colon, a few more in  his sigmoid colon immediately proximal to the anastomosis, but these  were  all scattered.  Approximately 11 to 12 cm from the anal verge, I could see  the staple line where his bowel was stapled together but he had no polyp  lesion or mass.  He did have some radiation changes which started maybe 6-8  cm before his anastomosis going to approximate 5 cm beyond his anastomosis.  These were radiation changes of the mucosa.  The scope was retroflexed  within the rectum.  I saw no rectal mass and palpated no rectal mass on  digital rectal exam.  Mr. Barbar had a normal colonoscopy.   I would recommend his next colonoscopy in three years.  He will see me back  in approximately November for his six month follow up.  He is to call for  any interval problems.       DHN/MEDQ  D:  04/01/2005  T:  04/01/2005  Job:  [id number]   cc:   Leighton Roach. Truett Perna, M.D.  [address]- [facility]  [place]  [place] [address]  Fax: [phone]  Elmer Sow. Dorna Bloom, M.D.  Fax: [phone]   Mosetta Putt, M.D.  [address] [place]  [place] [address]  Fax: [phone]

## 2011-03-07 NOTE — Op Note (Signed)
[place]. [facility]  Patient:    Justin [name], Justin [name] Visit Number: [id number] MRN: [id number]          Service Type: DSU Location: DAY Attending Physician:  Andre Lefort Dictated by:   Sandria Bales. Ezzard Standing, M.D. Proc. Date: 02/24/02 Admit Date:  02/24/2002 Discharge Date: 02/24/2002   CC:         Meredith Staggers, M.D.   Operative Report  DATE OF BIRTH:  1955-10-23.  PREOPERATIVE DIAGNOSES: 1. Left inguinal hernia. 2. Guaiac-positive stools.  POSTOPERATIVE DIAGNOSES: 1. Medium-sized direct left inguinal hernia. 2. 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 3. Pancolonic diverticulosis.  PROCEDURES: 1. Laparoscopic left inguinal hernia repair with 4 x 6 mesh placed. 2. Colonoscopy.  SURGEON:  Sandria Bales. Ezzard Standing, M.D.  ANESTHESIA:  General endotracheal.  ESTIMATED BLOOD LOSS:  Minimal.  INDICATION FOR PROCEDURE:  Mr. Mahon is a 55 year old white male who has had bilateral inguinal hernia repairs before.  He now comes with a bulge in his left groin consistent with recurrent left inguinal hernia.  He has also had some trouble with some blood in his stool and had guaiac-positive stool in the office and at the same time I do the hernia repair, we plan to do a full colonoscopy.  He has had a bowel prep.  DESCRIPTION OF PROCEDURE:  Patient placed taken to the operating room, placed in a supine position with both arms tucked and a Foley catheter in place.  His lower abdomen was shaved, then prepped with Betadine solution and sterilely draped.  Because the hernia was on the left side, I made an infraumbilical incision more to the left side of the midline and went to the preperitoneal space behind the rectus abdominis muscle and used the PBD balloon to insufflate and dissect this area.  I then inserted the Hasson trocar and insufflated with about 10-12 mmHg of pressure.  I then placed two 5 mm ports in both  lower quadrants.  I dissected out his inguinal floor, found a medium-sized direct left inguinal hernia in the lateral aspect of the floor.  He did not have an indirect hernia, but he did have some of the peritoneum scarred up at the internal ring, which I pulled down.  I actually tore a small hole into the peritoneal cavity.  After dissecting out the inguinal floor, identifying the cord structures and encircling it entirely, I then placed a piece of 4 x 6 mesh in the preperitoneal space _____ the internal ring, so this was just an onlay mesh that I stapled medially to the pubic tubercle, inferiorly to the shelving edge of the inguinal ligament, and superiorly to the transversalis fascia, and laterally above the inguinal ligament where it was palpable.  I then removed the trocars and visualized these under direct visualization and saw no bleeding at any trocar site.  The fascia at the umbilical site was closed with a 0 Vicryl suture.  Each port site was then infiltrated with 0.25% Marcaine using about 12 cc total.  The skin at each site was closed with a 5-0 Vicryl suture, painted with tincture of benzoin and Steri-Strips, and sterilely dressed.  The patient tolerated the procedure well and was transported to the recovery room in good condition.  At the end of the laparoscopic repair of the inguinal hernia, I did place the patient in lithotomy position and passed a flexible Olympus colonoscope.  I was able to pass the scope around  to the cecum, but I visualized diverticula in about every aspect of the colon, and this was a moderate diverticulosis in the cecum, in his transverse colon, in his left colon, and his sigmoid colon. Unfortunately, at about 13-14 cm from the anal verge I encountered a villous tumor-appearing lesion in the middle of this.  This occupied about 50% of the circumference of the colon.  It appears to be a primary colon carcinoma.  I did not biopsy this.  Again, the  tumors length appeared to be 4-5 cm in length.  The bottom edge of this was 13-14 cm from the anal verge.  It was not palpable on rectal exam.  I then withdrew the scope into the rectum, and I withdrew it.  IMPRESSION:  The patient has a rectosigmoid carcinoma at 13-14 cm from the anal verge and will need to have this evaluated and resected. Dictated by:   Sandria Bales. Ezzard Standing, M.D. Attending Physician:  Andre Lefort DD:  02/24/02 TD:  02/26/02 Job: [id number]

## 2011-03-07 NOTE — Discharge Summary (Signed)
[place]. [facility]  Patient:    Justin [name], ANDEL Visit Number: [id number] MRN: [id number]          Service Type: DSU Location: [facility] Attending Physician:  Andre Lefort Dictated by:   Sandria Bales. Ezzard Standing, M.D. Admit Date:  03/23/2002 Discharge Date: 03/23/2002   CC:         Arsenio Loader, M.D.  Mancel Bale, M.D.  Jackelyn Knife, M.D.   Discharge Summary  DATE OF BIRTH:  January 16, 1956  DISCHARGE DIAGNOSES: 1. 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 2. Meckels diverticula. 3. Recent left inguinal hernia surgery. 4. Postoperative urinary retention. 5. Moderate anxiety.  OPERATIONS PERFORMED: The patient had a low anterior resection with a #28 EEA stapler, appendectomy, resection of Meckels diverticula and a rigid sigmoidoscopy performed on Mar 09, 2002.  HISTORY OF ILLNESS:  This is a 55 year old white male patient of Dr. Janeice Robinson who had a laparoscopic repair of a recurrent left inguinal hernia on Feb 24, 2002. During the same operation I performed a colonoscopy because the patient had a history of a change in bowel habits and blood in his stools. At the time of colonoscopy I found a friable lesion consistent with a primary carcinoma of the colon approximately 13 to 14 cm from the anal verge.  The patient had no prior history of colonic exam and no family history of colon cancer.  He underwent a CT scan as an outpatient which revealed no obvious evidence of any mets.  He had a CEA which was 1.9.  He now returns to the hospital for resection of his colon tumor.  ALLERGIES:  None.  MEDICATIONS: Amitriptyline which he uses for sleep and anxiety.  REVIEW OF SYSTEMS: Noncontributory.  HOSPITAL COURSE: The patient completed an antibiotic and mechanical bowel prep at home and presented to the hospital on May 21.  His laboratories on admission showed a normal EKG.  He again had a CEA which was 1.9  preoperatively.  At the time of surgery he underwent a low anterior resection of a rectal tumor.  He was also found to have a Meckels diverticula which was resected and I did an appendectomy on him also.  Postoperatively he did well.  His first postoperative day his white blood count was 13,000, hemoglobin 13.0, hemoglobin 38.0.  His electrolytes were okay.  I tried to take his Foley out on the second postoperative day but he required reinsertion of the Foley catheter.  His NG tube was removed on the fourth postoperative day and he was started on clear liquids on the fifth postoperative day.  By the seventh postoperative day he was taking a diet well and his incision was healing well.  We tried to remove his catheter again unsuccessfully so I will send him home with a Foley catheter and a leg bag. He also had some insomnia which actually predated the surgery.  His final pathology came back as an invasive adenocarcinoma with negative surgical margins.  The tumor invaded into the pericolonic adipose tissue and was 4.5 cm in size.  He had one of ten lymph nodes positive, so he was a T3 N1 high rectal carcinoma.  I asked Dr. Mancel Bale and Dr. Jackelyn Knife to both see him in consultation and their notes are part of the chart.  The discharge instructions were reviewed which included a regular diet.  He was given Vicodin for pain. He had the  Foley catheter in place.  He was to leave this in for three or four days and remove it and if he cannot void be in touch with our office and we would arrange for him to see one of the urologists.  FOLLOW-UP: 1. He has follow-up appointments with Dr. Truett Perna and Dr. Dorna Bloom. 2. I will see him in about a week to talk about placement of a Port-A-Cath    catheter.  CC: Leonides Sake, M.D. Dictated by:   Sandria Bales. Ezzard Standing, M.D. Attending Physician:  Andre Lefort DD:  03/23/02 TD:  03/25/02 Job: [id number]

## 2011-03-07 NOTE — Op Note (Signed)
[place]. [facility]  Patient:    Justin [name], Justin [name] Visit Number: [id number] MRN: [id number]          Service Type: DSU Location: [facility] Attending Physician:  Andre Lefort Dictated by:   Sandria Bales. Ezzard Standing, M.D. Proc. Date: 03/23/02 Admit Date:  03/23/2002 Discharge Date: 03/23/2002   CC:         Meredith Staggers, M.D.  Leighton Roach. Truett Perna, M.D.  Jackelyn Knife, M.D.   Operative Report  DATE OF BIRTH:  1955/12/17. CCS # [id number].  PREOPERATIVE DIAGNOSIS:  Colon carcinoma, anticipate chemotherapy.  POSTOPERATIVE DIAGNOSIS:  T3, N1 colorectal carcinoma, anticipate chemotherapy.  PROCEDURE:  Left subclavian LifePort placement.  SURGEON:  Sandria Bales. Ezzard Standing, M.D.  ANESTHESIA:  Local, 15 cc Xylocaine with MAC.  COMPLICATIONS:  None.  INDICATION FOR PROCEDURE:  [name] had a high rectal carcinoma resected, which was a T3, N1 carcinoma.  He now anticipates chemotherapy and has seen Dr. Mardelle Matte and Dr. Jackelyn Knife for consideration of chemo-radiation therapy.  DESCRIPTION OF PROCEDURE:  The patient comes for placement of a Port-A-Cath. He is in Trendelenburg position with a roll under his back, his arms tucked to his sides.  His upper chest was shaved, prepped with Betadine solution, and sterilely draped.  The left subclavian was accessed with the 16-gauge needle and a guidewire threaded through this with a single stick.  Position of the guidewire checked with fluoroscopy, showing position in the superior vena cava.  I then developed a pocket for his port reservoir, sewed this in place with a 3-0 Vicryl suture.  The Silastic tubing was threaded from the port reservoir to the subclavian stick site and then introduced into the subclavian vein using a 9.2 French introducer and positioned in the superior vena cava with fluoroscopy.  This was then attached to the reservoir device.  The entire unit had been flushed with dilute heparin  solution 10 units/cc.  The entire area was checked again with fluoroscopy.  I then injected the concentrated heparin into the reservoir at 100 units/cc, a total of 5 cc, to flush out the catheter.  I then closed the subcutaneous tissues with 3-0 Vicryl suture, the skin with a 5-0 subcuticular Vicryl suture, painted with tincture of benzoin and Steri-Strips and sterilely dressed.  The patient tolerated the procedure well, was transported to the recovery room in good condition.  A chest x-ray is pending at the time of this dictation. Dictated by:   Sandria Bales. Ezzard Standing, M.D. Attending Physician:  Andre Lefort DD:  03/23/02 TD:  03/25/02 Job: [id number]

## 2011-03-07 NOTE — Op Note (Signed)
[facility]  Patient:    Justin [name], Justin [name] Visit Number: [id number] MRN: [id number]          Service Type: SUR Location: 3W 0348 01 Attending Physician:  Justin [name] Dictated by:   Justin [name], M.D. Proc. Date: 03/09/02 Admit Date:  03/09/2002   CC:         Justin [name], M.D.   Operative Report  DATE OF BIRTH:  1956-08-13  PREOPERATIVE DIAGNOSIS:  Rectosigmoid colon carcinoma at approximately 13-14 cm.  POSTOPERATIVE DIAGNOSES: 1. Rectosigmoid colon carcinoma at 13 cm from the anal verge. 2. Diverticulosis. 3. Diverticulitis in the mid sigmoid colon, Meckels diverticula.  PROCEDURE:  Low anterior resection with #28 EEA stapler, appendectomy, resection of Meckels diverticula, rigid sigmoidoscopy.  SURGEON:  Justin [name], M.D.  FIRST ASSISTANT:  Justin [name], M.D.  ANESTHESIA:  General endotracheal.  ESTIMATED BLOOD LOSS:  400 cc.  DRAINS:  None.  INDICATIONS FOR PROCEDURE:  Justin [name] is a 55 year old white male who has had some blood in his stool and change in bowel habits. During a laparoscopic hernia repair that I did on the 8th of May I also did a colonoscopy and identified a rectosigmoid colon carcinoma 13-14 cm from the anal verge. He has undergone a CT scan which shows no evidence of metastatic disease and has a CEA which is 1.9. He now comes for attempted primary resection of this rectosigmoid colon carcinoma.  DESCRIPTION OF PROCEDURE:  The patient was placed in a supine position. He completed antibiotic and mechanical bowel prep at home. He had a general endotracheal anesthetic supervised by Dr. Brantley [name]. He was placed in lithotomy with his legs up. He had a Foley catheter in place. I did a rigid sigmoidoscopy at the initiation of this procedure again to documented the anastomosis at 13-14 cm from the anal verge. I then prepped his abdomen and perineum with Betadine solution, made a  low midline abdominal incision, sharp dissection carried down to the abdominal cavity. The right and left lobe of the liver were unremarkable. The stomach had an NG tube in good position. I could not feel any gallstones, his spleen was palpably abnormal. He had no retroperitoneal mass or adenopathy. The small bowel was run from the ligament of Treitz to the terminal ileum. Approximately 2 feet from the terminal ileum, I identified a fairly long narrow Meckels diverticula which was probably about 8-9 cm in length. I then resected this Meckels diverticula resecting the mesentery and ligating it with 3-0 silk suture and then using a GIA stapler across the base of the Meckels and this was sent to pathology for permanent evaluation. I then palpated the colon and I did a full colonoscopy, identified his appendix, took down the mesentery of the appendix and with 3-0 silk sutures ligated the base of the appendix with 2-0 chromic suture and then inverted the stump of the appendix inside a 3-0 silk pursestring suture. I then turned my attention to the pelvic area. Interestingly in getting into the abdomen, I cut a little bit of the medial aspect of his mesh where I saw both a clip and the mesh but I was able to kind of stay around to the right side of the mesh in the lower abdomen. He also had evidence of kind of an inflammatory mass along the sigmoid colon attached to his right pelvic brim. This appeared to be below where the mesh was in place so I dont think  it was really related to the hernia repair. It looked like some chronic diverticula so I guess the other possibility would have been a tumor. I had to resect this out with the specimen. The tumor itself was palpated right at the peritoneal reflection. I freed up the sigmoid colon off the pelvic sidewall. I then went on and did my resection. First I divided the sigmoid colon using a GIA stapler 55 proximal to this inflammatory mass at about the  mid sigmoid colon. I then took down the mesentery of the sigmoid colon down to the sacral promontory, identified both the left and right ureters which were well lateral to dissection. I then developed both the presacral dissection and going along both sides of the sigmoid to rectum and freed up the colon at the peritoneal reflection. I was able to get well below the tumor going probably 4 or 5 cm with my dissection. I was able to divide both lateral sidewalls supporting the rectum with the harmonic scalpel. The whole time during this dissection, the ureters were well lateral to the dissection. I got down to where I visualized actually both seminal vesicles of my dissection. I then went medial. I thought again there was a good 4 or 5 cm cleaned off the colon. Then using a reticulating TA55 stapler, placed this in kind of an up and down angle and fired it across the colon. I then sent this as a fresh specimen and histology gave me a call and said that the tumor was approximately 4 to 4 1/2 cm in diameter and was 3 1/2 cm from the distal margin. The proximal end did show diverticulosis and diverticulitis as this was an inflammatory mass. There was no gross residual tumor left behind. I then freshened up the proximal bowel, removed the staples and first tried to use the pursestring device on the colon but this really did not work well so I took out the suture and had to rehandsew the proximal bowel. I was able to put a 25 and 28 EEA sizer but I could not get the 31 in safely so I put in a 28 EEA stapler. Dr. Maryagnes [name] went below past the EEA stapler up through the rectum, again the anastomosis appeared to be at about 10 cm. He was able to force the trocar through the distal rectal stump. I then attached the EEA device to the EEA stapler. He then closed it and fired it. Inspection of both rings were intact. We sent the distal ring for analysis as it was closest to the tumor. He then did a rigid  sigmoidoscopy visualizing  the anastomosis and pumping air in the sigmoid colon. There was neither a leak of air nor was there any problem with the anastomosis. There was some tension on the anastomosis line, we were able to free that up where it seemed like we had minimal tension on the line. This was by kind of cleaning up and loosening up the left colonic gutter colon. I then irrigated out the pelvis with 2 liters, there was no bleeding. I went back and inspected the appendix and the Meckels diverticular site and they were both without any evidence of bleeding. I then brought the omentum down over the bowel. I then closed the abdominal wall in a single layer of two #1 PDS sutures. I then irrigated the subcutaneous tissues and closed the skin with a skin guide. The patient tolerated the procedure well and will be transported to the recovery room  in good condition. Sponge and needle count were correct. Final pathology is pending. Dictated by:   Justin [name], M.D. Attending Physician:  Justin [name] DD:  03/09/02 TD:  03/10/02 Job: [id number]

## 2011-03-10 ENCOUNTER — Other Ambulatory Visit: Payer: Self-pay | Admitting: *Deleted

## 2011-03-10 NOTE — Telephone Encounter (Signed)
See 02/28/11 documentation.

## 2011-03-10 NOTE — Telephone Encounter (Signed)
Call placed to [facility] at [phone], spoke with pharmacist Vincenza Hews.  He was advised to cancell Rx previously submitted for gabapentin. Call was placed to  [phone], to verify reason for refill request. Patient stated that the request was for a 90 day supply with refills. He stated that he has been using the Gabapentin for his foot, elbow and hip issues, and the medication helps with decreasing the pain in his nerves. He is also requesting a refill on Diazepam. He was informed his last visit was in December and his request may require office office. He was informed if office visit was needed he would receive a return phone call,otherwise he was advised to check with the pharmacy

## 2011-03-14 MED ORDER — GABAPENTIN 300 MG PO CAPS: 300.0000 mg | ORAL_CAPSULE | Freq: Three times a day (TID) | ORAL | Status: DC

## 2011-03-14 NOTE — Telephone Encounter (Signed)
Call placed to patient at  [phone] call forwarded to [phone], no answer, a detailed voice message  Was left informing patient per Dr Artist Pais instructions,and refill.

## 2011-03-14 NOTE — Telephone Encounter (Signed)
Refill x 1.  Needs OV for additional refills

## 2011-03-20 ENCOUNTER — Encounter: Payer: Self-pay | Admitting: Internal Medicine

## 2011-03-25 ENCOUNTER — Ambulatory Visit (INDEPENDENT_AMBULATORY_CARE_PROVIDER_SITE_OTHER): Payer: Managed Care, Other (non HMO) | Admitting: Family Medicine

## 2011-03-25 ENCOUNTER — Ambulatory Visit: Payer: Self-pay | Admitting: Internal Medicine

## 2011-03-25 ENCOUNTER — Encounter: Payer: Self-pay | Admitting: Family Medicine

## 2011-03-25 VITALS — BP 104/68 | HR 70 | Temp 98.7°F | Resp 19 | Wt 178.0 lb

## 2011-03-25 DIAGNOSIS — G4700 Insomnia, unspecified: Secondary | ICD-10-CM

## 2011-03-25 DIAGNOSIS — S92009A Unspecified fracture of unspecified calcaneus, initial encounter for closed fracture: Secondary | ICD-10-CM

## 2011-03-25 MED ORDER — DIAZEPAM 5 MG PO TABS: 5.0000 mg | ORAL_TABLET | Freq: Every day | ORAL | Status: DC

## 2011-03-25 NOTE — Progress Notes (Signed)
OFFICE NOTE  03/25/2011  CC:  Chief Complaint  Patient presents with  . Anxiety    no concerns  . Medication Refill    on Diazepam and samples of Cialis     HPI:   Patient is a 55 y.o. Caucasian male who is here for here for anxiety and insomnia f/u. Says things are going well as long as he takes 5mg  diazepam every night.   Unfortunately he fell off a ladder and fractured his left heel 10/19/10 and a metal plate was used for surgical fixation.  He is still dealing with pain from this, sometimes up to 7/10 intensity when he's on it a lot.  Avoids pain meds b/c of history of intolerance to many NSAIDs and tylenol (reports itchy rash on right arm comes up??).  Meloxicam from orthopedist not helping.  Vicodin helps but he only takes it at night and even then he takes it rarely b/c he just doesn't like to take this kind of med.  Ortho rx'd neurontin and he occasionally takes one but says it does cause drowsiness and he doesn't like the way it makes him feel.  He has ortho f/u again in a couple of months.  Unfortunately, he's not had much opportunity to spend time OFF of his heel since the surgery.  Pertinent PMH:  Anxiety Insomnia ED Left heel fracture 09/2010  MEDS;   Outpatient Prescriptions Prior to Visit  Medication Sig Dispense Refill  . aspirin 81 MG tablet Take 81 mg by mouth daily.        [name] Kitchen gabapentin (NEURONTIN) 300 MG capsule Take 1 capsule (300 mg total) by mouth 3 (three) times daily.  270 capsule  0  . tadalafil (CIALIS) 5 MG tablet Take 5 mg by mouth daily as needed.        . diazepam (VALIUM) 5 MG tablet Take 5 mg by mouth at bedtime as needed.          PE: Blood pressure 104/68, pulse 70, temperature 98.7 F (37.1 C), temperature source Oral, resp. rate 19, weight 178 lb (80.74 kg), SpO2 97.00%. Gen: Alert, well appearing.  Patient is oriented to person, place, time, and situation. No further exam today.  IMPRESSION AND PLAN:  INSOMNIA, CHRONIC Well controlled  chronic anxiety and insomnia.  Continue diazepam 5mg  qhs, RFs given today.  Heel fracture With ongoing pain at the site s/p surgical fixation with metal plate.   Discussed pain treatment, encouraged more use of the vicodin he has so he's not in misery.  Keep ortho f/u scheduled.     FOLLOW UP:  Return in about 6 months (around 09/24/2011).

## 2011-03-25 NOTE — Assessment & Plan Note (Signed)
With ongoing pain at the site s/p surgical fixation with metal plate.   Discussed pain treatment, encouraged more use of the vicodin he has so he's not in misery.  Keep ortho f/u scheduled.

## 2011-03-25 NOTE — Assessment & Plan Note (Signed)
Well controlled chronic anxiety and insomnia.  Continue diazepam 5mg  qhs, RFs given today.

## 2011-06-27 ENCOUNTER — Ambulatory Visit: Payer: Managed Care, Other (non HMO) | Admitting: Internal Medicine

## 2011-06-27 DIAGNOSIS — Z0289 Encounter for other administrative examinations: Secondary | ICD-10-CM

## 2011-06-30 ENCOUNTER — Ambulatory Visit (INDEPENDENT_AMBULATORY_CARE_PROVIDER_SITE_OTHER): Payer: Managed Care, Other (non HMO) | Admitting: Internal Medicine

## 2011-06-30 ENCOUNTER — Other Ambulatory Visit: Payer: Self-pay | Admitting: Internal Medicine

## 2011-06-30 ENCOUNTER — Ambulatory Visit (HOSPITAL_BASED_OUTPATIENT_CLINIC_OR_DEPARTMENT_OTHER)
Admission: RE | Admit: 2011-06-30 | Discharge: 2011-06-30 | Disposition: A | Discharge status: Home / Self Care | Payer: Managed Care, Other (non HMO) | Source: Ambulatory Visit | Attending: Internal Medicine | Admitting: Internal Medicine

## 2011-06-30 ENCOUNTER — Encounter: Payer: Self-pay | Admitting: Internal Medicine

## 2011-06-30 ENCOUNTER — Ambulatory Visit (INDEPENDENT_AMBULATORY_CARE_PROVIDER_SITE_OTHER)
Admission: RE | Admit: 2011-06-30 | Discharge: 2011-06-30 | Disposition: A | Discharge status: Home / Self Care | Payer: Managed Care, Other (non HMO) | Source: Ambulatory Visit | Attending: Internal Medicine | Admitting: Internal Medicine

## 2011-06-30 DIAGNOSIS — N50819 Testicular pain, unspecified: Secondary | ICD-10-CM

## 2011-06-30 DIAGNOSIS — R634 Abnormal weight loss: Secondary | ICD-10-CM

## 2011-06-30 DIAGNOSIS — N508 Other specified disorders of male genital organs: Secondary | ICD-10-CM

## 2011-06-30 DIAGNOSIS — F411 Generalized anxiety disorder: Secondary | ICD-10-CM

## 2011-06-30 DIAGNOSIS — F419 Anxiety disorder, unspecified: Secondary | ICD-10-CM

## 2011-06-30 DIAGNOSIS — N509 Disorder of male genital organs, unspecified: Secondary | ICD-10-CM

## 2011-06-30 DIAGNOSIS — I861 Scrotal varices: Secondary | ICD-10-CM

## 2011-06-30 LAB — HEPATIC FUNCTION PANEL
AST: 15 U/L (ref 0–37)
Alkaline Phosphatase: 78 U/L (ref 39–117)
Indirect Bilirubin: 0.4 mg/dL (ref 0.0–0.9)
Total Bilirubin: 0.5 mg/dL (ref 0.3–1.2)

## 2011-06-30 LAB — CBC
Hemoglobin: 16.2 g/dL (ref 13.0–17.0)
MCH: 32.3 pg (ref 26.0–34.0)
MCHC: 33.1 g/dL (ref 30.0–36.0)
Platelets: 189 10*3/uL (ref 150–400)
RBC: 5.02 MIL/uL (ref 4.22–5.81)

## 2011-06-30 LAB — BASIC METABOLIC PANEL
Calcium: 9.6 mg/dL (ref 8.4–10.5)
Glucose, Bld: 108 mg/dL — ABNORMAL HIGH (ref 70–99)
Potassium: 4.3 mEq/L (ref 3.5–5.3)
Sodium: 140 mEq/L (ref 135–145)

## 2011-06-30 MED ORDER — CIPROFLOXACIN HCL 500 MG PO TABS: 500.0000 mg | ORAL_TABLET | Freq: Two times a day (BID) | ORAL | Status: DC

## 2011-06-30 MED ORDER — PAROXETINE HCL 20 MG PO TABS: 20.0000 mg | ORAL_TABLET | Freq: Every day | ORAL | Status: DC

## 2011-06-30 NOTE — Progress Notes (Signed)
Subjective:    Patient ID: Justin [name], male    DOB: 1956/08/18, 55 y.o.   MRN: [id number]  HPI Pt presents to clinic for evaluation of multiple medical problems. Notes 1.5 month h/o left testicle pain without injury. No urinary sx or hematuria. No urethral dc. Pt not aware of any testicular masses. No exacerbating or alleviating factors. 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 Also notes decreased appetite and 5lb wt loss since June. Notes worsening anxiety without trigger. No other complaints.  Past Medical History  Diagnosis Date  . Colon cancer 2003    specialist due for repeat colonoscopy in 2012  . Anxiety     takes approximately 10 diazepam/month-contracts to no more than that amount  . Depression   . Hyperlipidemia   . BPH (benign prostatic hypertrophy)     sees urology  . Overactive bladder   . Multiple fractures 08/2008    patient fell off a rood about 13 feet onto concrete-multiple fractures including vertrebral  . History of kidney stones   . Insomnia    Past Surgical History  Procedure Date  . Hip fracture surgery   . Elbow surgery   . Hernia repair   . Colectomy 02/2002    sigmoid colectomy    reports that he has quit smoking. He has never used smokeless tobacco. He reports that he does not drink alcohol or use illicit drugs. family history includes Coronary artery disease in his father.  There is no history of Other. No Known Allergies   Review of Systems see hpi     Objective:   Physical Exam  Physical Exam  Nursing note and vitals reviewed. Constitutional: He appears well-developed and well-nourished. No distress.  HENT:  Head: Normocephalic and atraumatic.  Right Ear: Tympanic membrane and external ear normal.  Left Ear: Tympanic membrane and external ear normal.  Nose: Nose normal.  Mouth/Throat: Uvula is midline, oropharynx is clear and moist and mucous  membranes are normal. No oropharyngeal exudate.  Eyes: Conjunctivae and EOM are normal. Pupils are equal, round, and reactive to light. Right eye exhibits no discharge. Left eye exhibits no discharge. No scleral icterus.  Neck: Neck supple. Carotid bruit is not present. No thyromegaly present.  Cardiovascular: Normal rate, regular rhythm and normal heart sounds.  Exam reveals no gallop and no friction rub.   No murmur heard. Pulmonary/Chest: Effort normal and breath sounds normal. No respiratory distress. He has no wheezes. He has no rales.  Abdominal: Soft. He exhibits no distension and no mass. There is no hepatosplenomegaly. There is no tenderness. There is no rebound. Hernia confirmed negative in the right inguinal area and confirmed negative in the left inguinal area.  Genitourinary: Rectum normal, prostate normal and testes normal. Rectal exam shows no mass and no tenderness. Guaiac negative stool. Prostate is not enlarged and not tender. Right testis shows no mass, no swelling and no tenderness. Right testis is descended. Left testis shows no mass, no swelling and no tenderness. Left testis is descended.  Lymphadenopathy:    He has no cervical adenopathy.       Right: No inguinal adenopathy present.       Left: No inguinal adenopathy present.  Neurological: He is alert.  Skin: Skin is warm and dry. He is not diaphoretic.  Psychiatric: He has a normal mood and affect.        Assessment & Plan:

## 2011-07-01 LAB — URINALYSIS, ROUTINE W REFLEX MICROSCOPIC
Glucose, UA: NEGATIVE mg/dL
Leukocytes, UA: NEGATIVE
Nitrite: NEGATIVE
Specific Gravity, Urine: 1.018 (ref 1.005–1.030)
pH: 7 (ref 5.0–8.0)

## 2011-07-01 LAB — SEDIMENTATION RATE: Sed Rate: 1 mm/hr (ref 0–16)

## 2011-07-05 DIAGNOSIS — N50819 Testicular pain, unspecified: Secondary | ICD-10-CM | POA: Insufficient documentation

## 2011-07-05 DIAGNOSIS — R634 Abnormal weight loss: Secondary | ICD-10-CM | POA: Insufficient documentation

## 2011-07-05 DIAGNOSIS — F419 Anxiety disorder, unspecified: Secondary | ICD-10-CM | POA: Insufficient documentation

## 2011-07-05 NOTE — Assessment & Plan Note (Signed)
Begin paxil 20mg  po qd and schedule close followup

## 2011-07-05 NOTE — Assessment & Plan Note (Signed)
Obtain ua. Begin ab. Schedule scrotal US.

## 2011-07-05 NOTE — Assessment & Plan Note (Signed)
Begin lab evaluation including psa, ua, tsh and esr

## 2011-07-07 ENCOUNTER — Telehealth: Payer: Self-pay | Admitting: *Deleted

## 2011-07-07 MED ORDER — SULFAMETHOXAZOLE-TRIMETHOPRIM 800-160 MG PO TABS: 1.0000 | ORAL_TABLET | Freq: Two times a day (BID) | ORAL | Status: AC

## 2011-07-07 NOTE — Telephone Encounter (Signed)
Septra ds bid x 7d

## 2011-07-07 NOTE — Telephone Encounter (Signed)
Call placed to patient at [phone], he was informed of medication change to pharmacy.

## 2011-07-07 NOTE — Telephone Encounter (Signed)
Patient called and left voice message stating the Cipro that was prescribed for him is causing him to be sick. His message stated that he is having nausea and sever fatigue. He would like to know if there is something else that could be prescribed for him.

## 2011-07-14 ENCOUNTER — Encounter: Payer: Self-pay | Admitting: Internal Medicine

## 2011-07-14 ENCOUNTER — Ambulatory Visit (INDEPENDENT_AMBULATORY_CARE_PROVIDER_SITE_OTHER): Payer: Managed Care, Other (non HMO) | Admitting: Internal Medicine

## 2011-07-14 VITALS — BP 118/82 | HR 78 | Temp 97.9°F | Ht 71.0 in | Wt 170.1 lb

## 2011-07-14 DIAGNOSIS — N50812 Left testicular pain: Secondary | ICD-10-CM

## 2011-07-14 DIAGNOSIS — F419 Anxiety disorder, unspecified: Secondary | ICD-10-CM

## 2011-07-14 DIAGNOSIS — F411 Generalized anxiety disorder: Secondary | ICD-10-CM

## 2011-07-14 DIAGNOSIS — Z23 Encounter for immunization: Secondary | ICD-10-CM

## 2011-07-14 DIAGNOSIS — N509 Disorder of male genital organs, unspecified: Secondary | ICD-10-CM

## 2011-07-14 MED ORDER — PAROXETINE HCL 40 MG PO TABS: 40.0000 mg | ORAL_TABLET | Freq: Every day | ORAL | Status: DC

## 2011-07-14 MED ORDER — GABAPENTIN 100 MG PO CAPS: 100.0000 mg | ORAL_CAPSULE | Freq: Every day | ORAL | Status: DC

## 2011-07-20 DIAGNOSIS — N50812 Left testicular pain: Secondary | ICD-10-CM | POA: Insufficient documentation

## 2011-07-20 NOTE — Assessment & Plan Note (Signed)
Increase paxil 40mg  po qd. Schedule close followup

## 2011-07-20 NOTE — Progress Notes (Signed)
  Subjective:    Patient ID: Justin [name], male    DOB: 01/04/1956, 55 y.o.   MRN: [id number]  HPI Pt presents to clinic for followup of multiple medical problems. H/o anxiety tolerating paxil. Still with persistent sx's of anxiety. Continues with left testicular pain x 1.5 months. Couldn't tolerates cipro but took course of septra without improvement. Reviewed scrotal US showing on small left varicoceole. Wishes to decrease neruontin nightly dosing. No other complaints.  Past Medical History  Diagnosis Date  . Colon cancer 2003    specialist due for repeat colonoscopy in 2012  . Anxiety     takes approximately 10 diazepam/month-contracts to no more than that amount  . Depression   . Hyperlipidemia   . BPH (benign prostatic hypertrophy)     sees urology  . Overactive bladder   . Multiple fractures 08/2008    patient fell off a rood about 13 feet onto concrete-multiple fractures including vertrebral  . History of kidney stones   . Insomnia    Past Surgical History  Procedure Date  . Hip fracture surgery   . Elbow surgery   . Hernia repair   . Colectomy 02/2002    sigmoid colectomy    reports that he has quit smoking. He has never used smokeless tobacco. He reports that he does not drink alcohol or use illicit drugs. family history includes Coronary artery disease in his father.  There is no history of Other. No Known Allergies   Review of Systems see hpi     Objective:   Physical Exam  Nursing note and vitals reviewed. Constitutional: He appears well-developed and well-nourished. No distress.  Neurological: He is alert.  Skin: He is not diaphoretic.  Psychiatric: He has a normal mood and affect.      Assessment & Plan:

## 2011-07-20 NOTE — Assessment & Plan Note (Signed)
No improvement with abx tx. Proceed with urology consult

## 2011-07-22 LAB — POCT I-STAT, CHEM 8
BUN: 15
Calcium, Ion: 1.12
Chloride: 107
Creatinine, Ser: 1.3
Glucose, Bld: 141 — ABNORMAL HIGH
TCO2: 23

## 2011-07-22 LAB — CBC
HCT: 44.3
Platelets: 158
RDW: 12.7

## 2011-08-11 ENCOUNTER — Encounter: Payer: Self-pay | Admitting: Internal Medicine

## 2011-08-11 ENCOUNTER — Ambulatory Visit (INDEPENDENT_AMBULATORY_CARE_PROVIDER_SITE_OTHER): Payer: Managed Care, Other (non HMO) | Admitting: Internal Medicine

## 2011-08-11 VITALS — BP 108/60 | HR 77 | Temp 97.9°F | Resp 16 | Wt 171.0 lb

## 2011-08-11 DIAGNOSIS — F411 Generalized anxiety disorder: Secondary | ICD-10-CM

## 2011-08-11 DIAGNOSIS — N509 Disorder of male genital organs, unspecified: Secondary | ICD-10-CM

## 2011-08-11 DIAGNOSIS — N50812 Left testicular pain: Secondary | ICD-10-CM

## 2011-08-11 DIAGNOSIS — R634 Abnormal weight loss: Secondary | ICD-10-CM

## 2011-08-11 DIAGNOSIS — F419 Anxiety disorder, unspecified: Secondary | ICD-10-CM

## 2011-08-11 MED ORDER — ESCITALOPRAM OXALATE 20 MG PO TABS: 20.0000 mg | ORAL_TABLET | Freq: Every day | ORAL | Status: DC

## 2011-08-11 NOTE — Assessment & Plan Note (Signed)
Failing paxil. Change to lexapro 20mg  po qd. Consider psychiatry referral if no improvement. Close follow up scheduled.

## 2011-08-11 NOTE — Assessment & Plan Note (Signed)
Stabilized. Will follow closely

## 2011-08-11 NOTE — Progress Notes (Signed)
  Subjective:    Patient ID: Justin [name], male    DOB: 07/12/1956, 55 y.o.   MRN: [id number]  HPI Pt presents to clinic for followup of multiple medical problems. Anxiety not much better despite paxil 40mg  qd. States has taken lexapro in the past and responded well to it. Continues with left testicular pain s/p [place] and abx course here. Now seeing urology and will complete ?3wk course of doxycycline tomorrow and will see urology this week in follow up. Wt is now stable up one pound from last visit. No other complaints.  Past Medical History  Diagnosis Date  . Colon cancer 2003    specialist due for repeat colonoscopy in 2012  . Anxiety     takes approximately 10 diazepam/month-contracts to no more than that amount  . Depression   . Hyperlipidemia   . BPH (benign prostatic hypertrophy)     sees urology  . Overactive bladder   . Multiple fractures 08/2008    patient fell off a rood about 13 feet onto concrete-multiple fractures including vertrebral  . History of kidney stones   . Insomnia    Past Surgical History  Procedure Date  . Hip fracture surgery   . Elbow surgery   . Hernia repair   . Colectomy 02/2002    sigmoid colectomy    reports that he has quit smoking. He has never used smokeless tobacco. He reports that he does not drink alcohol or use illicit drugs. family history includes Coronary artery disease in his father.  There is no history of Other. No Known Allergies   Review of Systems see hpi     Objective:   Physical Exam  Physical Exam  Nursing note and vitals reviewed. Constitutional: Appears well-developed and well-nourished. No distress.  HENT:  Head: Normocephalic and atraumatic.  Right Ear: External ear normal.  Left Ear: External ear normal.  Eyes: Conjunctivae are normal. No scleral icterus.  Neck: Neck supple.  Cardiovascular: Normal rate, regular rhythm and normal heart sounds.  Exam reveals no gallop and no friction rub.   No murmur  heard. Pulmonary/Chest: Effort normal and breath sounds normal. No respiratory distress. He has no wheezes. no rales.  Lymphadenopathy:    He has no cervical adenopathy.  Neurological:Alert.  Skin: Skin is warm and dry. Not diaphoretic.  Psychiatric: Has a normal mood and affect.         Assessment & Plan:

## 2011-08-11 NOTE — Assessment & Plan Note (Signed)
No improvement s/p two courses of abx. [place] essentially unrevealing. Follow up with urology this week.

## 2011-09-22 ENCOUNTER — Ambulatory Visit: Payer: Managed Care, Other (non HMO) | Admitting: Internal Medicine

## 2011-09-30 ENCOUNTER — Telehealth: Payer: Self-pay | Admitting: Internal Medicine

## 2011-09-30 NOTE — Telephone Encounter (Signed)
Opened in error

## 2011-10-07 ENCOUNTER — Other Ambulatory Visit: Payer: Self-pay | Admitting: *Deleted

## 2011-10-07 MED ORDER — DIAZEPAM 5 MG PO TABS: 5.0000 mg | ORAL_TABLET | Freq: Every day | ORAL | Status: DC

## 2011-10-07 NOTE — Telephone Encounter (Signed)
Verbal refill provided to pharmacist at [facility].

## 2011-10-07 NOTE — Telephone Encounter (Signed)
Patient called and left voice message stating he contacted the pharmacy for a refill on Diazepam, and was informed that a new Rx is needed from the doctors office. Is it okay to provide a refill for this patient?

## 2011-10-07 NOTE — Telephone Encounter (Signed)
[place] database reviewed. Ok rf #4

## 2011-11-27 ENCOUNTER — Other Ambulatory Visit: Payer: Self-pay | Admitting: *Deleted

## 2011-11-27 MED ORDER — GABAPENTIN 100 MG PO CAPS: 100.0000 mg | ORAL_CAPSULE | Freq: Every day | ORAL | Status: DC

## 2011-11-27 NOTE — Telephone Encounter (Signed)
Received fax from [facility] requesting refill of Gabapentin. #90 x 1 refill sent to pharmacy.

## 2011-12-11 ENCOUNTER — Other Ambulatory Visit: Payer: Self-pay | Admitting: *Deleted

## 2011-12-11 MED ORDER — TRAZODONE HCL 150 MG PO TABS: 150.0000 mg | ORAL_TABLET | Freq: Every day | ORAL | Status: DC

## 2011-12-11 NOTE — Telephone Encounter (Signed)
rf #6. Just needs f/u within a few months

## 2011-12-11 NOTE — Telephone Encounter (Signed)
Patient called and left voice message requesting a refill on Trazadone. Patient last ov was in 07/2011. He was advised one month follow up on medication. No future appointments scheduled.

## 2011-12-11 NOTE — Telephone Encounter (Signed)
Rx refill sent to pharmacy. Call placed to patient at [phone], he was informed per Dr Rodena Medin instructions. He has scheduled follow up for May 21,2013 @ 8:30am.

## 2012-03-09 ENCOUNTER — Ambulatory Visit: Payer: Managed Care, Other (non HMO) | Admitting: Internal Medicine

## 2012-03-10 ENCOUNTER — Encounter: Payer: Self-pay | Admitting: Internal Medicine

## 2012-03-10 ENCOUNTER — Ambulatory Visit (INDEPENDENT_AMBULATORY_CARE_PROVIDER_SITE_OTHER): Payer: Managed Care, Other (non HMO) | Admitting: Internal Medicine

## 2012-03-10 VITALS — BP 100/72 | HR 71 | Temp 98.0°F | Resp 16 | Wt 177.0 lb

## 2012-03-10 DIAGNOSIS — G8929 Other chronic pain: Secondary | ICD-10-CM | POA: Insufficient documentation

## 2012-03-10 DIAGNOSIS — F419 Anxiety disorder, unspecified: Secondary | ICD-10-CM

## 2012-03-10 DIAGNOSIS — R634 Abnormal weight loss: Secondary | ICD-10-CM

## 2012-03-10 DIAGNOSIS — M79609 Pain in unspecified limb: Secondary | ICD-10-CM

## 2012-03-10 DIAGNOSIS — M79673 Pain in unspecified foot: Secondary | ICD-10-CM | POA: Insufficient documentation

## 2012-03-10 DIAGNOSIS — F411 Generalized anxiety disorder: Secondary | ICD-10-CM

## 2012-03-10 HISTORY — DX: Other chronic pain: G89.29

## 2012-03-10 MED ORDER — DIAZEPAM 5 MG PO TABS: 5.0000 mg | ORAL_TABLET | Freq: Every day | ORAL | Status: DC

## 2012-03-10 MED ORDER — MELOXICAM 7.5 MG PO TABS: ORAL_TABLET | ORAL | Status: AC

## 2012-03-10 MED ORDER — GABAPENTIN 300 MG PO CAPS: ORAL_CAPSULE | ORAL | Status: DC

## 2012-03-10 NOTE — Assessment & Plan Note (Signed)
resolved 

## 2012-03-10 NOTE — Assessment & Plan Note (Signed)
Attempt mobic prn with food and no other nsaids. Increase neurontin dose. Followup if no improvement or worsening.

## 2012-03-10 NOTE — Assessment & Plan Note (Signed)
Stable. Continue lexapro. Refill valium

## 2012-03-10 NOTE — Progress Notes (Signed)
  Subjective:    Patient ID: Justin [name], male    DOB: 1956/09/09, 56 y.o.   MRN: [id number]  HPI Pt presents to clinic for followup of multiple medical problems. Notes chronic intermittent left foot pain located in heel and left lateral edge. H/o fracture with surgery in the past. Has pain but also component of neuropathic pain. Takes neurontin 100mg  qhs recently self increased to 200mg . Has stable anxiety using valium without evidence of addiction or tolerance. Self decreased lexapro to 1/2 tablet and doing well. Needs biometric data for work form. Does not have work form with him. Waist circumference measured 34.5 inches.   Past Medical History  Diagnosis Date  . Colon cancer 2003    specialist due for repeat colonoscopy in 2012  . Anxiety     takes approximately 10 diazepam/month-contracts to no more than that amount  . Depression   . Hyperlipidemia   . BPH (benign prostatic hypertrophy)     sees urology  . Overactive bladder   . Multiple fractures 08/2008    patient fell off a rood about 13 feet onto concrete-multiple fractures including vertrebral  . History of kidney stones   . Insomnia    Past Surgical History  Procedure Date  . Hip fracture surgery   . Elbow surgery   . Hernia repair   . Colectomy 02/2002    sigmoid colectomy    reports that he has quit smoking. He has never used smokeless tobacco. He reports that he does not drink alcohol or use illicit drugs. family history includes Coronary artery disease in his father.  There is no history of Other. No Known Allergies    Review of Systems see hpi     Objective:   Physical Exam  Nursing note and vitals reviewed. Constitutional: He appears well-developed and well-nourished. No distress.  HENT:  Head: Normocephalic.  Eyes: Conjunctivae are normal. No scleral icterus.  Musculoskeletal:       Left foot- NT. No bony abn. +1 DP pulse  Neurological: He is alert.  Skin: He is not diaphoretic.  Psychiatric: He  has a normal mood and affect.          Assessment & Plan:

## 2012-06-07 ENCOUNTER — Telehealth: Payer: Self-pay | Admitting: Internal Medicine

## 2012-06-07 MED ORDER — TRAZODONE HCL 150 MG PO TABS: 150.0000 mg | ORAL_TABLET | Freq: Every day | ORAL | Status: DC

## 2012-06-07 NOTE — Telephone Encounter (Signed)
Faxed script back to [place] home delivery... 06/07/12@3 :50pm/LMB

## 2012-06-07 NOTE — Telephone Encounter (Signed)
Ok #90 rf2 

## 2012-06-07 NOTE — Telephone Encounter (Signed)
New Prescription Request   desyrel 150mg  tab.

## 2012-06-07 NOTE — Telephone Encounter (Signed)
Is this ok to refill?... 06/07/12@11 :10am/LMB

## 2012-06-25 ENCOUNTER — Ambulatory Visit (INDEPENDENT_AMBULATORY_CARE_PROVIDER_SITE_OTHER): Payer: Managed Care, Other (non HMO) | Admitting: Family

## 2012-06-25 ENCOUNTER — Encounter: Payer: Self-pay | Admitting: Family

## 2012-06-25 ENCOUNTER — Telehealth: Payer: Self-pay | Admitting: Family

## 2012-06-25 VITALS — BP 118/82 | HR 74 | Temp 98.1°F | Resp 16 | Ht 71.0 in | Wt 176.1 lb

## 2012-06-25 DIAGNOSIS — H6690 Otitis media, unspecified, unspecified ear: Secondary | ICD-10-CM

## 2012-06-25 DIAGNOSIS — R109 Unspecified abdominal pain: Secondary | ICD-10-CM | POA: Insufficient documentation

## 2012-06-25 DIAGNOSIS — H6692 Otitis media, unspecified, left ear: Secondary | ICD-10-CM | POA: Insufficient documentation

## 2012-06-25 LAB — POCT URINALYSIS DIPSTICK
Bilirubin, UA: NEGATIVE
Ketones, UA: NEGATIVE
Spec Grav, UA: <=1.005
pH, UA: 7

## 2012-06-25 MED ORDER — CEFUROXIME AXETIL 500 MG PO TABS: 500.0000 mg | ORAL_TABLET | Freq: Two times a day (BID) | ORAL | Status: AC

## 2012-06-25 NOTE — Assessment & Plan Note (Signed)
Will rx with ceftin.  This is likely cause for his dizziness.

## 2012-06-25 NOTE — Assessment & Plan Note (Signed)
Obtain CT to exclude kidney stone.  UA notes + blood, + leuks.  Will culture and plan empiric ceftin (rx'd for OM).

## 2012-06-25 NOTE — Patient Instructions (Addendum)
Please complete your CT on the first floor.  Call if your symptoms worsen or if no improvement in 2-3 days. You may use aleve as needed for pain.

## 2012-06-25 NOTE — Telephone Encounter (Signed)
Spoke to pt. Reviewed ct results. Recommended aleve prn for likely ms pain. Start ceftin for om. Pt verbalizes understanding.

## 2012-06-25 NOTE — Progress Notes (Signed)
Subjective:    Patient ID: Justin [name], male    DOB: [birth year], 56 y.o.   MRN: [id number]  HPI  Justin [name] is a 56 yr old male who presents today with several concerns.  1) Left sided abdominal pain- this pain radiates into the left flank and is dull in nature.  Pt reports that pain is constant and worsening. He denies associated frequency,hematuria, or fever. He does have hx of nephrolithiasis per CT abd/pelvis in 2009.  He reports normal BM today.  + hx of IBS.  2) Dizziness-  Pt notes recent dizziness with standing or turning his head suddenly.     Review of Systems See HPI  Past Medical History  Diagnosis Date  . Colon cancer 2003    specialist due for repeat colonoscopy in 2012  . Anxiety     takes approximately 10 diazepam/month-contracts to no more than that amount  . Depression   . Hyperlipidemia   . BPH (benign prostatic hypertrophy)     sees urology  . Overactive bladder   . Multiple fractures 08/2008    patient fell off a [name] about 13 feet onto concrete-multiple fractures including vertrebral  . History of kidney stones   . Insomnia     History   Social History  . Marital Status: Married    Spouse Name: N/A    Number of Children: N/A  . Years of Education: N/A   Occupational History  . Not on file.   Social History Main Topics  . Smoking status: Former Games developer  . Smokeless tobacco: Never Used   Comment: quit tobacco 2000 smoked for 20 years  . Alcohol Use: No     former drinker  . Drug Use: No  . Sexually Active: Not on file   Other Topics Concern  . 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 Hip fracture surgery   . Elbow surgery   . Hernia repair   .  Colectomy 02/2002    sigmoid colectomy    Family History  Problem Relation Age of Onset  . Coronary artery disease Father   . Other Neg Hx     no FH od colon cancer    No Known Allergies  Current Outpatient Prescriptions on File Prior to Visit  Medication Sig Dispense Refill  . diazepam (VALIUM) 5 MG tablet Take 1 tablet (5 mg total) by mouth at bedtime.  90 tablet  2  . gabapentin (NEURONTIN) 300 MG capsule One twice a day  60 capsule  3  . traZODone (DESYREL) 150 MG tablet Take 1 tablet (150 mg total) by mouth at bedtime.  90 tablet  2    BP 118/82  Pulse 74  Temp 98.1 F (36.7 C) (Oral)  Resp 16  Ht 5\' 11"  (1.803 m)  Wt 176 lb 1.9 oz (79.888 kg)  BMI 24.56 kg/m2  SpO2 97%       Objective:   Physical Exam  Constitutional: He appears well-developed and well-nourished. No distress.  HENT:  Right Ear: Tympanic membrane and ear canal normal.  Left Ear: Tympanic membrane is erythematous. Tympanic membrane is not bulging.  Cardiovascular: Normal rate and regular  rhythm.   No murmur heard. Pulmonary/Chest: Effort normal and breath sounds normal. No respiratory distress. He has no wheezes. He has no rales. He exhibits no tenderness.  Abdominal:       Mild left lower quadrant tenderness to deep palpation without guarding.   Genitourinary:       Neg CVAT bilaterally.          Assessment & Plan:

## 2012-06-29 ENCOUNTER — Ambulatory Visit (HOSPITAL_BASED_OUTPATIENT_CLINIC_OR_DEPARTMENT_OTHER)
Admission: RE | Admit: 2012-06-29 | Discharge: 2012-06-29 | Disposition: A | Discharge status: Home / Self Care | Payer: Managed Care, Other (non HMO) | Source: Ambulatory Visit | Attending: Occupational Medicine | Admitting: Occupational Medicine

## 2012-06-29 ENCOUNTER — Other Ambulatory Visit: Payer: Self-pay | Admitting: Occupational Medicine

## 2012-06-29 DIAGNOSIS — Z0000 Encounter for general adult medical examination without abnormal findings: Secondary | ICD-10-CM

## 2012-07-13 ENCOUNTER — Encounter: Payer: Self-pay | Admitting: Internal Medicine

## 2012-07-13 ENCOUNTER — Ambulatory Visit (INDEPENDENT_AMBULATORY_CARE_PROVIDER_SITE_OTHER): Payer: Managed Care, Other (non HMO) | Admitting: Internal Medicine

## 2012-07-13 VITALS — BP 116/82 | HR 70 | Temp 98.1°F | Resp 16 | Wt 180.0 lb

## 2012-07-13 DIAGNOSIS — E785 Hyperlipidemia, unspecified: Secondary | ICD-10-CM

## 2012-07-13 DIAGNOSIS — N400 Enlarged prostate without lower urinary tract symptoms: Secondary | ICD-10-CM

## 2012-07-13 DIAGNOSIS — R1032 Left lower quadrant pain: Secondary | ICD-10-CM

## 2012-07-13 DIAGNOSIS — F419 Anxiety disorder, unspecified: Secondary | ICD-10-CM

## 2012-07-13 DIAGNOSIS — R109 Unspecified abdominal pain: Secondary | ICD-10-CM

## 2012-07-13 DIAGNOSIS — F411 Generalized anxiety disorder: Secondary | ICD-10-CM

## 2012-07-13 DIAGNOSIS — Z23 Encounter for immunization: Secondary | ICD-10-CM

## 2012-07-13 LAB — CBC WITH DIFFERENTIAL/PLATELET
Eosinophils Relative: 1 % (ref 0–5)
Hemoglobin: 16.1 g/dL (ref 13.0–17.0)
Lymphocytes Relative: 17 % (ref 12–46)
Lymphs Abs: 1 10*3/uL (ref 0.7–4.0)
MCV: 93.2 fL (ref 78.0–100.0)
Monocytes Relative: 8 % (ref 3–12)
Platelets: 190 10*3/uL (ref 150–400)
RBC: 5 MIL/uL (ref 4.22–5.81)
WBC: 5.8 10*3/uL (ref 4.0–10.5)

## 2012-07-13 LAB — HEPATIC FUNCTION PANEL
ALT: 8 U/L (ref 0–53)
Alkaline Phosphatase: 57 U/L (ref 39–117)
Indirect Bilirubin: 0.5 mg/dL (ref 0.0–0.9)
Total Protein: 6.6 g/dL (ref 6.0–8.3)

## 2012-07-13 LAB — BASIC METABOLIC PANEL
CO2: 29 mEq/L (ref 19–32)
Chloride: 103 mEq/L (ref 96–112)
Sodium: 139 mEq/L (ref 135–145)

## 2012-07-13 LAB — LIPID PANEL: LDL Cholesterol: 115 mg/dL — ABNORMAL HIGH (ref 0–99)

## 2012-07-13 LAB — PSA: PSA: 0.54 ng/mL (ref <=4.00)

## 2012-07-13 MED ORDER — TRAZODONE HCL 150 MG PO TABS: 150.0000 mg | ORAL_TABLET | Freq: Every day | ORAL | Status: DC

## 2012-07-13 MED ORDER — DIAZEPAM 5 MG PO TABS: 5.0000 mg | ORAL_TABLET | Freq: Every day | ORAL | Status: DC

## 2012-07-13 NOTE — Progress Notes (Signed)
  Subjective:    Patient ID: Justin [name], male    DOB: Mar 05, 1956, 56 y.o.   MRN: [id number]  HPI Pt presents to clinic for followup of multiple medical problems. Discontinuation and persistence of left lower quadrant abdominal pain without radiation. Duration this point is over 2-3 weeks. Not associated with food or changes in bowel or bladder habits. Pain is not reproducible or positional patient does not feel represents muscular skeletal pain. Underwent abdominal CT without acute finding. Does have known history of colon cancer. Colonoscopy last performed over two thousand twelve. No alleviating or exacerbating factors. Patient self DC'd Neurontin. Weight stable.  Past Medical History  Diagnosis Date  . Colon cancer 2003    specialist due for repeat colonoscopy in 2012  . Anxiety     takes approximately 10 diazepam/month-contracts to no more than that amount  . Depression   . Hyperlipidemia   . BPH (benign prostatic hypertrophy)     sees urology  . Overactive bladder   . Multiple fractures 08/2008    patient fell off a rood about 13 feet onto concrete-multiple fractures including vertrebral  . History of kidney stones   . Insomnia    Past Surgical History  Procedure Date  . Hip fracture surgery   . Elbow surgery   . Hernia repair   . Colectomy 02/2002    sigmoid colectomy    reports that he has quit smoking. He has never used smokeless tobacco. He reports that he does not drink alcohol or use illicit drugs. family history includes Coronary artery disease in his father.  There is no history of Other. No Known Allergies    Review of Systems see hpi     Objective:   Physical Exam  Physical Exam  Nursing note and vitals reviewed. Constitutional: Appears well-developed and well-nourished. No distress.  HENT:  Head: Normocephalic and atraumatic.  Right Ear: External ear normal.  Left Ear: External ear normal.  Eyes: Conjunctivae are normal. No scleral icterus.    Neck: Neck supple. Carotid bruit is not present.  Cardiovascular: Normal rate, regular rhythm and normal heart sounds.  Exam reveals no gallop and no friction rub.   No murmur heard. Pulmonary/Chest: Effort normal and breath sounds normal. No respiratory distress. He has no wheezes. no rales.  Abdomen: Soft non-distended, positive bowel sounds, nontender. No masses or organomegaly appreciated  Lymphadenopathy:    He has no cervical adenopathy.  Neurological:Alert.  Skin: Skin is warm and dry. Not diaphoretic.  Psychiatric: Has a normal mood and affect.        Assessment & Plan:

## 2012-07-13 NOTE — Assessment & Plan Note (Signed)
Obtain PSA

## 2012-07-13 NOTE — Assessment & Plan Note (Signed)
Obtain fasting lipid profile 

## 2012-07-13 NOTE — Assessment & Plan Note (Signed)
Persistent. Not suggestive of musculoskeletal etiology. Status post abdominal CT. Recommend cautious approach given history of colon cancer. Proceed with GI consult. Obtain CBC Chem-7 and LFTs

## 2012-07-13 NOTE — Assessment & Plan Note (Signed)
Stable.  Refill Valium. 

## 2012-07-13 NOTE — Addendum Note (Signed)
Addended by: Regis Bill on: 07/13/2012 04:59 PM   Modules accepted: Orders

## 2012-07-25 ENCOUNTER — Other Ambulatory Visit: Payer: Self-pay | Admitting: Internal Medicine

## 2012-07-27 ENCOUNTER — Encounter: Payer: Self-pay | Admitting: Internal Medicine

## 2012-07-27 ENCOUNTER — Ambulatory Visit: Payer: Self-pay | Admitting: Internal Medicine

## 2012-07-27 ENCOUNTER — Ambulatory Visit (INDEPENDENT_AMBULATORY_CARE_PROVIDER_SITE_OTHER): Payer: Managed Care, Other (non HMO) | Admitting: Internal Medicine

## 2012-07-27 VITALS — BP 114/82 | HR 83 | Temp 98.4°F | Wt 179.1 lb

## 2012-07-27 DIAGNOSIS — F411 Generalized anxiety disorder: Secondary | ICD-10-CM

## 2012-07-27 DIAGNOSIS — R42 Dizziness and giddiness: Secondary | ICD-10-CM

## 2012-07-27 DIAGNOSIS — M549 Dorsalgia, unspecified: Secondary | ICD-10-CM

## 2012-07-27 DIAGNOSIS — F419 Anxiety disorder, unspecified: Secondary | ICD-10-CM

## 2012-07-27 MED ORDER — TRAMADOL HCL 50 MG PO TABS: 50.0000 mg | ORAL_TABLET | Freq: Three times a day (TID) | ORAL | Status: DC | PRN

## 2012-07-27 MED ORDER — TRAZODONE HCL 300 MG PO TABS: 300.0000 mg | ORAL_TABLET | Freq: Every day | ORAL | Status: DC

## 2012-07-27 MED ORDER — ESCITALOPRAM OXALATE 20 MG PO TABS: 20.0000 mg | ORAL_TABLET | Freq: Every day | ORAL | Status: DC

## 2012-07-28 DIAGNOSIS — R42 Dizziness and giddiness: Secondary | ICD-10-CM | POA: Insufficient documentation

## 2012-07-28 NOTE — Assessment & Plan Note (Signed)
No associated neurologic deficits. Consider vestibular etiology. Recommend ENT consult and patient currently defers.

## 2012-07-28 NOTE — Assessment & Plan Note (Signed)
Attempt Ultram when necessary.

## 2012-07-28 NOTE — Assessment & Plan Note (Signed)
Increase trazodone 300 mg a day. Resume Lexapro 20 mg a day. Schedule followup in one month or sooner if necessary

## 2012-07-28 NOTE — Progress Notes (Signed)
  Subjective:    Patient ID: Justin [name], male    DOB: 1956-06-21, 56 y.o.   MRN: [id number]  HPI Pt presents to clinic for followup of multiple medical problems. Continues with left lower quadrant abdominal pain without radiation. Pain is somewhat improved. Has GI consult pending. Complains of insomnia both initiating and sustaining sleep. Last night attempted extra half Valium. Feels that his mind does race at night when he is trying to sleep. Previously took Lexapro 20 mg a day for anxiety without adverse effect. Suffers chronic pain located in multiple areas including back after a fall resulting in a compression fracture. Has also had a pelvic fracture status post surgery. States has a plate in his right arm and hardware in his left foot. Suffers daily pain in these areas rated between three and 6/10. Ibuprofen and hydrocodone cause itching. Also notes two month history of intermittent dizziness daily without presyncope or syncope. Does not appear to be postural however from his description may relate to head position.  Past Medical History  Diagnosis Date  . Colon cancer 2003    specialist due for repeat colonoscopy in 2012  . Anxiety     takes approximately 10 diazepam/month-contracts to no more than that amount  . Depression   . Hyperlipidemia   . BPH (benign prostatic hypertrophy)     sees urology  . Overactive bladder   . Multiple fractures 08/2008    patient fell off a rood about 13 feet onto concrete-multiple fractures including vertrebral  . History of kidney stones   . Insomnia    Past Surgical History  Procedure Date  . Hip fracture surgery   . Elbow surgery   . Hernia repair   . Colectomy 02/2002    sigmoid colectomy    reports that he has quit smoking. He has never used smokeless tobacco. He reports that he does not drink alcohol or use illicit drugs. family history includes Coronary artery disease in his father.  There is no history of Other. No Known  Allergies    Review of Systems see hpi     Objective:   Physical Exam  Nursing note and vitals reviewed. Constitutional: He appears well-developed and well-nourished. No distress.  Neurological: He is alert.  Skin: He is not diaphoretic.  Psychiatric: He has a normal mood and affect.          Assessment & Plan:

## 2012-08-25 ENCOUNTER — Ambulatory Visit (INDEPENDENT_AMBULATORY_CARE_PROVIDER_SITE_OTHER): Payer: Managed Care, Other (non HMO) | Admitting: Internal Medicine

## 2012-08-25 ENCOUNTER — Encounter: Payer: Self-pay | Admitting: Internal Medicine

## 2012-08-25 VITALS — BP 102/70 | HR 67 | Temp 98.4°F | Resp 16 | Wt 177.5 lb

## 2012-08-25 DIAGNOSIS — G8929 Other chronic pain: Secondary | ICD-10-CM

## 2012-08-25 DIAGNOSIS — R42 Dizziness and giddiness: Secondary | ICD-10-CM

## 2012-08-25 DIAGNOSIS — F411 Generalized anxiety disorder: Secondary | ICD-10-CM

## 2012-08-25 DIAGNOSIS — F419 Anxiety disorder, unspecified: Secondary | ICD-10-CM

## 2012-08-25 HISTORY — DX: Other chronic pain: G89.29

## 2012-08-25 MED ORDER — ESCITALOPRAM OXALATE 20 MG PO TABS: 20.0000 mg | ORAL_TABLET | Freq: Every day | ORAL | Status: DC

## 2012-08-25 MED ORDER — DIAZEPAM 5 MG PO TABS: 5.0000 mg | ORAL_TABLET | Freq: Every day | ORAL | Status: DC

## 2012-08-25 NOTE — Progress Notes (Signed)
  Subjective:    Patient ID: Justin [name], male    DOB: [birth year], 56 y.o.   MRN: [id number]  HPI Pt presents to clinic for followup of multiple medical problems. Was having difficulty with insomnia and mind racing at night. It history of anxiety Lexapro was resumed. Tolerates without side effect. Notes improvement of symptoms. Has not increased trazodone dose yet. Dizziness is improved but not resolved. Seems to be some association with head turning. With diffuse chronic pain related to fall and past surgeries attempted tramadol. Stopped it after one dose due to side effects.  Past Medical History  Diagnosis Date  . Colon cancer 2003    specialist due for repeat colonoscopy in 2012  . Anxiety     takes approximately 10 diazepam/month-contracts to no more than that amount  . Depression   . Hyperlipidemia   . BPH (benign prostatic hypertrophy)     sees urology  . Overactive bladder   . Multiple fractures 08/2008    patient fell off a rood about 13 feet onto concrete-multiple fractures including vertrebral  . History of kidney stones   . Insomnia    Past Surgical History  Procedure Date  . Hip fracture surgery   . Elbow surgery   . Hernia repair   . Colectomy 02/2002    sigmoid colectomy    reports that he has quit smoking. He has never used smokeless tobacco. He reports that he does not drink alcohol or use illicit drugs. family history includes Coronary artery disease in his father.  There is no history of Other. No Known Allergies   Review of Systems see hpi     Objective:   Physical Exam  Nursing note and vitals reviewed. Constitutional: He appears well-developed and well-nourished. No distress.  HENT:  Head: Normocephalic and atraumatic.  Neurological: He is alert.  Skin: He is not diaphoretic.  Psychiatric: He has a normal mood and affect. His behavior is normal.          Assessment & Plan:

## 2012-08-25 NOTE — Assessment & Plan Note (Signed)
Improving. Provided with printed handout for vestibular exercises.

## 2012-08-25 NOTE — Assessment & Plan Note (Signed)
Improving with Lexapro

## 2012-08-25 NOTE — Assessment & Plan Note (Signed)
Diffuse. relates to past injury and multiple surgeries. Reattempt Ultram at half dose.

## 2012-08-26 ENCOUNTER — Other Ambulatory Visit: Payer: Self-pay

## 2012-10-06 ENCOUNTER — Encounter: Payer: Self-pay | Admitting: Internal Medicine

## 2012-11-24 ENCOUNTER — Ambulatory Visit: Payer: Self-pay | Admitting: Family

## 2012-12-02 ENCOUNTER — Ambulatory Visit: Payer: Self-pay | Admitting: Family

## 2012-12-14 ENCOUNTER — Encounter: Payer: Self-pay | Admitting: Family

## 2012-12-14 ENCOUNTER — Ambulatory Visit (INDEPENDENT_AMBULATORY_CARE_PROVIDER_SITE_OTHER): Payer: Managed Care, Other (non HMO) | Admitting: Family

## 2012-12-14 VITALS — BP 110/88 | HR 80 | Temp 98.4°F | Resp 16 | Wt 173.0 lb

## 2012-12-14 DIAGNOSIS — G8929 Other chronic pain: Secondary | ICD-10-CM

## 2012-12-14 DIAGNOSIS — F411 Generalized anxiety disorder: Secondary | ICD-10-CM

## 2012-12-14 DIAGNOSIS — F419 Anxiety disorder, unspecified: Secondary | ICD-10-CM

## 2012-12-14 DIAGNOSIS — K589 Irritable bowel syndrome without diarrhea: Secondary | ICD-10-CM

## 2012-12-14 MED ORDER — DIAZEPAM 5 MG PO TABS: 5.0000 mg | ORAL_TABLET | Freq: Every day | ORAL | Status: DC

## 2012-12-14 NOTE — Progress Notes (Signed)
Subjective:    Patient ID: Justin [name], male    DOB: [birth year], 57 y.o.   MRN: [id number]  HPI  Justin [name] is a 57 yr old male who presents today for follow up.    Reports + nausea with tramadol.  He has chronic pain issues related to an accident in 2009 with multiple orthopedic injuries.  At this point, he does not want additional pain medication/narcotics or referral to pain management. Feels that he can "live with it."   He is seeing Dr. Norma Fredrickson- GI at [facility] for diarrhea- ? IBS .  This is an ongoing issue. Has been started on questran which he thinks is helping.   Anxiety is ok- has a lot of pressure at work.  He is currently maintained on lexapro 20 which he is tolerating.      Review of Systems See HPI  Past Medical History  Diagnosis Date  . Colon cancer 2003    specialist due for repeat colonoscopy in 2012  . Anxiety     takes approximately 10 diazepam/month-contracts to no more than that amount  . Depression   . Hyperlipidemia   . BPH (benign prostatic hypertrophy)     sees urology  . Overactive bladder   . Multiple fractures 08/2008    patient fell off a rood about 13 feet onto concrete-multiple fractures including vertrebral  . History of kidney stones   . Insomnia     History   Social History  . Marital Status: Married    Spouse Name: N/A    Number of Children: N/A  . Years of Education: N/A   Occupational History  . Not on file.   Social History Main Topics  . Smoking status: Former Games developer  . Smokeless tobacco: Never Used     Comment: quit tobacco 2000 smoked for 20 years  . Alcohol Use: No     Comment: former drinker  . Drug Use: No  . Sexually Active: Not on file   Other Topics Concern  . 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 Hip fracture surgery    . Elbow surgery    . Hernia repair    . Colectomy  02/2002    sigmoid colectomy    Family History  Problem Relation Age of Onset  . Coronary artery disease Father   . Other Neg Hx     no FH od colon cancer    No Known Allergies  Current Outpatient Prescriptions on File Prior to Visit  Medication Sig Dispense Refill  . escitalopram (LEXAPRO) 20 MG tablet Take 1 tablet (20 mg total) by mouth daily.  90 tablet  1  . traZODone (DESYREL) 300 MG tablet Take 1 tablet (300 mg total) by mouth at bedtime.  30 tablet  6  . lactobacillus acidophilus (BACID) TABS Take 1 tablet by mouth daily.       No current facility-administered medications on file prior to visit.    BP 110/88  Pulse 80  Temp(Src) 98.4 F (36.9 C) (Oral)  Resp 16  Wt 173 lb (78.472 kg)  BMI  24.14 kg/m2  SpO2 96%       Objective:   Physical Exam  Constitutional: He is oriented to person, place, and time. He appears well-developed and well-nourished. No distress.  Cardiovascular: Normal rate and regular rhythm.   No murmur heard. Pulmonary/Chest: Effort normal and breath sounds normal. No respiratory distress. He has no wheezes. He has no rales. He exhibits no tenderness.  Neurological: He is alert and oriented to person, place, and time.  Psychiatric: He has a normal mood and affect. His behavior is normal. Judgment and thought content normal.          Assessment & Plan:

## 2012-12-14 NOTE — Patient Instructions (Addendum)
Please follow up in 4 months, sooner if problems/concerns.

## 2012-12-20 DIAGNOSIS — K589 Irritable bowel syndrome without diarrhea: Secondary | ICD-10-CM | POA: Insufficient documentation

## 2012-12-20 HISTORY — DX: Irritable bowel syndrome, unspecified: K58.9

## 2012-12-20 NOTE — Assessment & Plan Note (Signed)
Following with Dr. Norma Fredrickson, defer management to GI.

## 2012-12-20 NOTE — Assessment & Plan Note (Signed)
Unchanged. Monitor for now.

## 2012-12-20 NOTE — Assessment & Plan Note (Signed)
Stable on lexapro.  Continue same . 

## 2013-03-06 ENCOUNTER — Other Ambulatory Visit: Payer: Self-pay | Admitting: Internal Medicine

## 2013-03-07 NOTE — Telephone Encounter (Signed)
Refill request for Lexapro Last filled by MD on - 08/25/12 #90 x1 Last seen- 12/14/12 F/U- 04/11/12 Please advise refill?

## 2013-03-08 ENCOUNTER — Ambulatory Visit (INDEPENDENT_AMBULATORY_CARE_PROVIDER_SITE_OTHER): Payer: Managed Care, Other (non HMO) | Admitting: Family

## 2013-03-08 ENCOUNTER — Telehealth (INDEPENDENT_AMBULATORY_CARE_PROVIDER_SITE_OTHER): Payer: Self-pay

## 2013-03-08 ENCOUNTER — Ambulatory Visit (INDEPENDENT_AMBULATORY_CARE_PROVIDER_SITE_OTHER): Payer: Managed Care, Other (non HMO) | Admitting: General Surgery

## 2013-03-08 ENCOUNTER — Encounter: Payer: Self-pay | Admitting: Family

## 2013-03-08 VITALS — BP 110/74 | HR 71 | Temp 97.9°F | Resp 16 | Ht 71.0 in | Wt 180.0 lb

## 2013-03-08 VITALS — BP 126/74 | HR 60 | Temp 99.6°F | Resp 18 | Ht 71.0 in | Wt 178.0 lb

## 2013-03-08 DIAGNOSIS — K612 Anorectal abscess: Secondary | ICD-10-CM

## 2013-03-08 DIAGNOSIS — K610 Anal abscess: Secondary | ICD-10-CM

## 2013-03-08 MED ORDER — HYDROCODONE-ACETAMINOPHEN 5-325 MG PO TABS: 1.0000 | ORAL_TABLET | ORAL | Status: DC | PRN

## 2013-03-08 MED ORDER — AMOXICILLIN-POT CLAVULANATE 875-125 MG PO TABS: 1.0000 | ORAL_TABLET | Freq: Two times a day (BID) | ORAL | Status: DC

## 2013-03-08 NOTE — Telephone Encounter (Signed)
Called and made patient aware that we are late due to a delay in surgical case today.  Patient aware that Dr. Johna Sheriff will not be here until 4:00 pm and he will plan to arrive around 4:15 and may have to wait.

## 2013-03-08 NOTE — Progress Notes (Signed)
Subjective:    Patient ID: Justin [name], male    DOB: 09-02-1956, 57 y.o.   MRN: [id number]  HPI  Justin [name] is a 57 yr old male who presents today with chief complaint of rectal pain and swelling.  Pain started 2 days ago.  Had mild constipation but nothing out of the ordinary. Denies associated blood.  Reports previous hx of hemorrhoids but reports that this feels different from previous hemorrhoid flares. Reports that the are seemed to be come larger overnight.      Review of Systems See HPI  Past Medical History  Diagnosis Date  . Colon cancer 2003    specialist due for repeat colonoscopy in 2012  . Anxiety     takes approximately 10 diazepam/month-contracts to no more than that amount  . Depression   . Hyperlipidemia   . BPH (benign prostatic hypertrophy)     sees urology  . Overactive bladder   . Multiple fractures 08/2008    patient fell off a rood about 13 feet onto concrete-multiple fractures including vertrebral  . History of kidney stones   . Insomnia     History   Social History  . Marital Status: Married    Spouse Name: N/A    Number of Children: N/A  . Years of Education: N/A   Occupational History  . Not on file.   Social History Main Topics  . Smoking status: Former Games developer  . Smokeless tobacco: Never Used     Comment: quit tobacco 2000 smoked for 20 years  . Alcohol Use: No     Comment: former drinker  . Drug Use: No  . Sexually Active: Not on file   Other Topics Concern  . 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 Hip fracture surgery    . Elbow surgery    . Hernia repair    . Colectomy  02/2002    sigmoid colectomy    Family  History  Problem Relation Age of Onset  . Coronary artery disease Father   . Other Neg Hx     no FH od colon cancer    No Known Allergies  Current Outpatient Prescriptions on File Prior to Visit  Medication Sig Dispense Refill  . diazepam (VALIUM) 5 MG tablet Take 1 tablet (5 mg total) by mouth at bedtime.  90 tablet  1  . escitalopram (LEXAPRO) 20 MG tablet Take 1 tablet (20 mg total) by mouth daily.  30 tablet  1  . gabapentin (NEURONTIN) 300 MG capsule Take 300 mg by mouth 2 (two) times daily.       . traZODone (DESYREL) 300 MG tablet Take 1 tablet (300 mg total) by mouth at bedtime.  30 tablet  6  . cholestyramine (QUESTRAN) 4 GM/DOSE powder Take 1 g by mouth 4 (four) times daily as needed.      . lactobacillus acidophilus (BACID) TABS Take 1 tablet by mouth daily.       No current facility-administered medications on file prior to visit.    BP 110/74  Pulse 71  Temp(Src) 97.9 F (36.6  C) (Oral)  Resp 16  Ht 5\' 11"  (1.803 m)  Wt 180 lb 0.6 oz (81.666 kg)  BMI 25.12 kg/m2  SpO2 95%       Objective:   Physical Exam  Constitutional: He appears well-developed and well-nourished. No distress.  Cardiovascular: Normal rate and regular rhythm.   No murmur heard. Pulmonary/Chest: Effort normal and breath sounds normal. No respiratory distress. He has no wheezes. He has no rales. He exhibits no tenderness.  Genitourinary:  + induration- marble sized, tenderness and non-fluctuant adjacent to the left side of anus.  No flucuance.  Smaller pea sized area of induration adjacent to larger area.  Psychiatric: He has a normal mood and affect. His behavior is normal. Judgment and thought content normal.          Assessment & Plan:

## 2013-03-08 NOTE — Patient Instructions (Addendum)
Start Augmentin. Do sitz baths twice daily. You will be contacted about your referral to the surgeon.  Please let us know if you have not heard back within 2 days. Call if increased pain/swelling, fever. Follow up on Friday here.

## 2013-03-08 NOTE — Progress Notes (Signed)
Chief complaint: Painful lump near anus  History: Patient comes to the office referred by Dr. Peggyann Juba. About one week ago he began to develop an area of swelling and pain just anterior to his anus. It has gotten worse on a daily basis. He saw Dr. Peggyann Juba today who felt he had an abscess and was referred to the office urgently. He has not had any drainage. No fever or chills. He has no previous history of anorectal abscess or fistula.   Past Medical History  Diagnosis Date  . Colon cancer 2003    specialist due for repeat colonoscopy in 2012  . Anxiety     takes approximately 10 diazepam/month-contracts to no more than that amount  . Depression   . Hyperlipidemia   . BPH (benign prostatic hypertrophy)     sees urology  . Overactive bladder   . Multiple fractures 08/2008    patient fell off a rood about 13 feet onto concrete-multiple fractures including vertrebral  . History of kidney stones   . Insomnia    Past Surgical History  Procedure Laterality Date  . Hip fracture surgery    . Elbow surgery    . Hernia repair    . Colectomy  02/2002    sigmoid colectomy   Current Outpatient Prescriptions  Medication Sig Dispense Refill  . amoxicillin-clavulanate (AUGMENTIN) 875-125 MG per tablet Take 1 tablet by mouth 2 (two) times daily.  20 tablet  0  . diazepam (VALIUM) 5 MG tablet Take 1 tablet (5 mg total) by mouth at bedtime.  90 tablet  1  . escitalopram (LEXAPRO) 20 MG tablet Take 1 tablet (20 mg total) by mouth daily.  30 tablet  1  . gabapentin (NEURONTIN) 300 MG capsule Take 300 mg by mouth 2 (two) times daily.       . traZODone (DESYREL) 300 MG tablet Take 1 tablet (300 mg total) by mouth at bedtime.  30 tablet  6  . cholestyramine (QUESTRAN) 4 GM/DOSE powder Take 1 g by mouth 4 (four) times daily as needed.      [name] Kitchen HYDROcodone-acetaminophen (NORCO/VICODIN) 5-325 MG per tablet Take 1-2 tablets by mouth every 4 (four) hours as needed for pain.  25 tablet  1  . lactobacillus  acidophilus (BACID) TABS Take 1 tablet by mouth daily.       No current facility-administered medications for this visit.   No Known Allergies   Exam: BP 126/74  Pulse 60  Temp(Src) 99.6 F (37.6 C)  Resp 18  Ht 5\' 11"  (1.803 m)  Wt 178 lb (80.74 kg)  BMI 24.84 kg/m2 General: Well-developed Caucasian male in no distress Pertinent exam was limited to the rectal exam. In the left anterior perianal space is an approximately 1 cm area of induration, tenderness and central fluctuance.  Assessment and plan: Perianal abscess. After explaining the procedure under local anesthesia this was unroofed and purulent material drained. A small cavity was packed with gauze. Wound care was explained. He is already on antibiotics. Return in 3 weeks to rule out fistula.

## 2013-03-08 NOTE — Assessment & Plan Note (Signed)
Recommended sitz bath bid, augmentin, referral to surgeon for I and D.  Call if symptoms worsening.  Follow up here on Friday. He is scheduled to go on vacation next week in [place].

## 2013-03-08 NOTE — Patient Instructions (Signed)
Remove Nu Gauze wick tomorrow if it is still in place then soak in tub or wash and shower twice daily. Call if you are not feeling steadily better.

## 2013-03-10 ENCOUNTER — Other Ambulatory Visit: Payer: Self-pay | Admitting: Internal Medicine

## 2013-03-11 ENCOUNTER — Encounter: Payer: Self-pay | Admitting: Family

## 2013-03-11 ENCOUNTER — Ambulatory Visit (INDEPENDENT_AMBULATORY_CARE_PROVIDER_SITE_OTHER): Payer: Managed Care, Other (non HMO) | Admitting: Family

## 2013-03-11 VITALS — BP 112/82 | HR 74 | Temp 98.4°F | Resp 16 | Ht 71.0 in | Wt 179.1 lb

## 2013-03-11 DIAGNOSIS — K612 Anorectal abscess: Secondary | ICD-10-CM

## 2013-03-11 DIAGNOSIS — K610 Anal abscess: Secondary | ICD-10-CM

## 2013-03-11 NOTE — Progress Notes (Signed)
Subjective:    Patient ID: Justin [name], male    DOB: May 24, 1956, 57 y.o.   MRN: [id number]  HPI  57 yr old male presents today for follow up of perianal abscess. He saw Dr. Johna Sheriff on 5/20 and had I and D with packing.  He is continued on augmentin. He removed packing the following day when he showered.  Reports that the area is still sore but seems to be improving.    Review of Systems    see HPI  Past Medical History  Diagnosis Date  . Colon cancer 2003    specialist due for repeat colonoscopy in 2012  . Anxiety     takes approximately 10 diazepam/month-contracts to no more than that amount  . Depression   . Hyperlipidemia   . BPH (benign prostatic hypertrophy)     sees urology  . Overactive bladder   . Multiple fractures 08/2008    patient fell off a rood about 13 feet onto concrete-multiple fractures including vertrebral  . History of kidney stones   . Insomnia     History   Social History  . Marital Status: Married    Spouse Name: N/A    Number of Children: N/A  . Years of Education: N/A   Occupational History  . Not on file.   Social History Main Topics  . Smoking status: Former Games developer  . Smokeless tobacco: Never Used     Comment: quit tobacco 2000 smoked for 20 years  . Alcohol Use: No     Comment: former drinker  . Drug Use: No  . Sexually Active: Not on file   Other Topics Concern  . 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 Hip fracture surgery    . Elbow surgery    . Hernia repair    . Colectomy  02/2002    sigmoid colectomy    Family History  Problem Relation Age of Onset  . Coronary artery disease Father   . Other Neg Hx     no  FH od colon cancer    No Known Allergies  Current Outpatient Prescriptions on File Prior to Visit  Medication Sig Dispense Refill  . amoxicillin-clavulanate (AUGMENTIN) 875-125 MG per tablet Take 1 tablet by mouth 2 (two) times daily.  20 tablet  0  . cholestyramine (QUESTRAN) 4 GM/DOSE powder Take 1 g by mouth 4 (four) times daily as needed.      . diazepam (VALIUM) 5 MG tablet Take 1 tablet (5 mg total) by mouth at bedtime.  90 tablet  1  . escitalopram (LEXAPRO) 20 MG tablet Take 1 tablet (20 mg total) by mouth daily.  30 tablet  1  . gabapentin (NEURONTIN) 300 MG capsule Take 300 mg by mouth 2 (two) times daily.       [name] Kitchen HYDROcodone-acetaminophen (NORCO/VICODIN) 5-325 MG per tablet Take 1-2 tablets by mouth every 4 (four) hours as needed for pain.  25 tablet  1  . lactobacillus acidophilus (BACID) TABS Take 1 tablet by mouth daily.      . trazodone (DESYREL) 300 MG tablet TAKE  1 TABLET BY MOUTH AT BEDTIME  30 tablet  1   No current facility-administered medications on file prior to visit.    BP 112/82  Pulse 74  Temp(Src) 98.4 F (36.9 C) (Oral)  Resp 16  Ht 5\' 11"  (1.803 m)  Wt 179 lb 1.3 oz (81.23 kg)  BMI 24.99 kg/m2  SpO2 96%    Objective:   Physical Exam  Constitutional: He appears well-developed and well-nourished. No distress.  Genitourinary:  Perianal abscess is smaller (pea sized), less tender, less induration. No fluctuance.           Assessment & Plan:

## 2013-03-11 NOTE — Patient Instructions (Addendum)
Please complete your antibiotics. Call if your symptoms do not continue to improve.  Keep your upcoming appointment with Dr. Nada Boozer.

## 2013-03-11 NOTE — Assessment & Plan Note (Signed)
Improving s/p I and D.  Continue abx, keep upcoming apt with Surgery as scheduled.

## 2013-04-01 ENCOUNTER — Ambulatory Visit (INDEPENDENT_AMBULATORY_CARE_PROVIDER_SITE_OTHER): Payer: Managed Care, Other (non HMO) | Admitting: General Surgery

## 2013-04-01 ENCOUNTER — Encounter (INDEPENDENT_AMBULATORY_CARE_PROVIDER_SITE_OTHER): Payer: Self-pay | Admitting: General Surgery

## 2013-04-01 VITALS — BP 122/76 | HR 84 | Temp 97.6°F | Resp 18 | Ht 71.0 in | Wt 175.2 lb

## 2013-04-01 DIAGNOSIS — K610 Anal abscess: Secondary | ICD-10-CM

## 2013-04-01 DIAGNOSIS — K612 Anorectal abscess: Secondary | ICD-10-CM

## 2013-04-01 NOTE — Progress Notes (Signed)
History: Patient returns for followup 3 weeks following incision and drainage of a perianal abscess. He reports at this point he feels well with no pain or drainage or other complaints.  Exam: BP 122/76  Pulse 84  Temp(Src) 97.6 F (36.4 C) (Temporal)  Resp 18  Ht 5\' 11"  (1.803 m)  Wt 175 lb 3.2 oz (79.47 kg)  BMI 24.45 kg/m2 On rectal exam twice a day site appears completely healed and there is no evidence of fistula or inflammation or any other personality  Assessment and plan: Doing well following incision and drainage of perianal abscess with no evidence of fistula or other complication. His discharge return as needed.

## 2013-04-11 ENCOUNTER — Ambulatory Visit (INDEPENDENT_AMBULATORY_CARE_PROVIDER_SITE_OTHER): Payer: Managed Care, Other (non HMO) | Admitting: Family

## 2013-04-11 ENCOUNTER — Encounter: Payer: Self-pay | Admitting: Family

## 2013-04-11 VITALS — BP 120/80 | HR 52 | Temp 98.2°F | Resp 16 | Wt 178.0 lb

## 2013-04-11 DIAGNOSIS — K589 Irritable bowel syndrome without diarrhea: Secondary | ICD-10-CM

## 2013-04-11 DIAGNOSIS — F419 Anxiety disorder, unspecified: Secondary | ICD-10-CM

## 2013-04-11 DIAGNOSIS — K610 Anal abscess: Secondary | ICD-10-CM

## 2013-04-11 DIAGNOSIS — F411 Generalized anxiety disorder: Secondary | ICD-10-CM

## 2013-04-11 DIAGNOSIS — K612 Anorectal abscess: Secondary | ICD-10-CM

## 2013-04-11 MED ORDER — ESCITALOPRAM OXALATE 20 MG PO TABS: 20.0000 mg | ORAL_TABLET | Freq: Every day | ORAL | Status: DC

## 2013-04-11 MED ORDER — TRAZODONE HCL 300 MG PO TABS: ORAL_TABLET | ORAL | Status: DC

## 2013-04-11 MED ORDER — GABAPENTIN 300 MG PO CAPS: 300.0000 mg | ORAL_CAPSULE | Freq: Two times a day (BID) | ORAL | Status: DC

## 2013-04-11 NOTE — Assessment & Plan Note (Signed)
Stable. Continue valium HS and sertraline.

## 2013-04-11 NOTE — Progress Notes (Signed)
Subjective:    Patient ID: Justin [name], male    DOB: 02/04/1956, 57 y.o.   MRN: [id number]  HPI  Anxiety- reports that this is well controlled.  Reports that depression remains well controlled.    IBS- reports that this is well controlled. Reports that he has been having formed stools.    Perianal abscess- reports that this is resolved following I and D.  Review of Systems See HPI  Past Medical History  Diagnosis Date  . Colon cancer 2003    specialist due for repeat colonoscopy in 2012  . Anxiety     takes approximately 10 diazepam/month-contracts to no more than that amount  . Depression   . Hyperlipidemia   . BPH (benign prostatic hypertrophy)     sees urology  . Overactive bladder   . Multiple fractures 08/2008    patient fell off a rood about 13 feet onto concrete-multiple fractures including vertrebral  . History of kidney stones   . Insomnia     History   Social History  . Marital Status: Married    Spouse Name: N/A    Number of Children: N/A  . Years of Education: N/A   Occupational History  . Not on file.   Social History Main Topics  . Smoking status: Former Games developer  . Smokeless tobacco: Never Used     Comment: quit tobacco 2000 smoked for 20 years  . Alcohol Use: No     Comment: former drinker  . Drug Use: No  . Sexually Active: Not on file   Other Topics Concern  . 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 Hip fracture surgery    . Elbow surgery    . Hernia repair    . Colectomy  02/2002    sigmoid colectomy    Family History  Problem Relation Age of Onset  . Coronary artery disease Father   . Other Neg Hx     no FH od colon  cancer    No Known Allergies  Current Outpatient Prescriptions on File Prior to Visit  Medication Sig Dispense Refill  . cholestyramine (QUESTRAN) 4 GM/DOSE powder Take 1 g by mouth 4 (four) times daily as needed.      . diazepam (VALIUM) 5 MG tablet Take 1 tablet (5 mg total) by mouth at bedtime.  90 tablet  1  . escitalopram (LEXAPRO) 20 MG tablet Take 1 tablet (20 mg total) by mouth daily.  30 tablet  1  . gabapentin (NEURONTIN) 300 MG capsule Take 300 mg by mouth 2 (two) times daily.       [name] Kitchen HYDROcodone-acetaminophen (NORCO/VICODIN) 5-325 MG per tablet Take 1-2 tablets by mouth every 4 (four) hours as needed for pain.  25 tablet  1  . lactobacillus acidophilus (BACID) TABS Take 1 tablet by mouth daily.      . trazodone (DESYREL) 300 MG tablet TAKE 1 TABLET BY MOUTH AT BEDTIME  30 tablet  1   No current facility-administered medications on file prior to visit.    BP 120/80  Pulse 52  Temp(Src) 98.2 F (  36.8 C) (Oral)  Resp 16  Wt 178 lb (80.74 kg)  BMI 24.84 kg/m2  SpO2 96%       Objective:   Physical Exam  Constitutional: He appears well-developed and well-nourished. No distress.  Pulmonary/Chest: Effort normal.  Psychiatric: He has a normal mood and affect. His behavior is normal. Judgment and thought content normal.          Assessment & Plan:

## 2013-04-11 NOTE — Patient Instructions (Addendum)
Please follow up in 6 month for cpx.

## 2013-04-11 NOTE — Assessment & Plan Note (Signed)
Improved.  Reports that he is no longer seeing Dr. Norma Fredrickson. Will continue to monitor.

## 2013-04-11 NOTE — Assessment & Plan Note (Signed)
Resolved. Status post I and D.

## 2013-04-13 ENCOUNTER — Emergency Department (HOSPITAL_BASED_OUTPATIENT_CLINIC_OR_DEPARTMENT_OTHER)
Admission: EM | Admit: 2013-04-13 | Discharge: 2013-04-13 | Disposition: A | Discharge status: Home / Self Care | Payer: Managed Care, Other (non HMO) | Attending: Emergency Medicine | Admitting: Emergency Medicine

## 2013-04-13 ENCOUNTER — Encounter (HOSPITAL_BASED_OUTPATIENT_CLINIC_OR_DEPARTMENT_OTHER): Payer: Self-pay | Admitting: *Deleted

## 2013-04-13 ENCOUNTER — Telehealth: Payer: Self-pay | Admitting: *Deleted

## 2013-04-13 DIAGNOSIS — F3289 Other specified depressive episodes: Secondary | ICD-10-CM | POA: Insufficient documentation

## 2013-04-13 DIAGNOSIS — Z87442 Personal history of urinary calculi: Secondary | ICD-10-CM | POA: Insufficient documentation

## 2013-04-13 DIAGNOSIS — Z85038 Personal history of other malignant neoplasm of large intestine: Secondary | ICD-10-CM | POA: Insufficient documentation

## 2013-04-13 DIAGNOSIS — F329 Major depressive disorder, single episode, unspecified: Secondary | ICD-10-CM | POA: Insufficient documentation

## 2013-04-13 DIAGNOSIS — G5600 Carpal tunnel syndrome, unspecified upper limb: Secondary | ICD-10-CM | POA: Insufficient documentation

## 2013-04-13 DIAGNOSIS — Z87891 Personal history of nicotine dependence: Secondary | ICD-10-CM | POA: Insufficient documentation

## 2013-04-13 DIAGNOSIS — Z87448 Personal history of other diseases of urinary system: Secondary | ICD-10-CM | POA: Insufficient documentation

## 2013-04-13 DIAGNOSIS — G5602 Carpal tunnel syndrome, left upper limb: Secondary | ICD-10-CM

## 2013-04-13 DIAGNOSIS — G4700 Insomnia, unspecified: Secondary | ICD-10-CM | POA: Insufficient documentation

## 2013-04-13 DIAGNOSIS — Z79899 Other long term (current) drug therapy: Secondary | ICD-10-CM | POA: Insufficient documentation

## 2013-04-13 DIAGNOSIS — F411 Generalized anxiety disorder: Secondary | ICD-10-CM | POA: Insufficient documentation

## 2013-04-13 DIAGNOSIS — Z8781 Personal history of (healed) traumatic fracture: Secondary | ICD-10-CM | POA: Insufficient documentation

## 2013-04-13 DIAGNOSIS — E785 Hyperlipidemia, unspecified: Secondary | ICD-10-CM | POA: Insufficient documentation

## 2013-04-13 NOTE — Telephone Encounter (Signed)
Received call from pt stating he has had new onset of numbness of his right hand and arm since he awoke this morning and symptoms have not resided. Feels like he has some swelling around his mouth on the right side as well. States he checked his BP this morning and systolic was 130 but doesn't remember the diastolic reading. Pt denies pain, extremity weakness, confusion. Advised pt to be seen in the ER for further evaluation and he voices understanding.

## 2013-04-13 NOTE — ED Notes (Signed)
Patient states he woke up this morning with tingling numbness in his right fingers and palm which has not resolved.  States approximately one hour pta, he noticed that he has swelling in there right lower face and states he feel "weird" in the right side of he face, like it is flushed feeling.  States he has had some constant mild shortness of breath for the last several months.  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 S/P right hand pinning in 2010 and elbow plate surgery 1610 from a fall.

## 2013-04-13 NOTE — ED Provider Notes (Signed)
History    CSN: [id number] Arrival date & time 04/13/13  1052  First MD Initiated Contact with Patient 04/13/13 1054     Chief Complaint  Patient presents with  . Numbness    right palm and right facial swelling   (Consider location/radiation/quality/duration/timing/severity/associated sxs/prior Treatment) HPI Pt reports he woke up this morning with numbness in his R hand, no pain, no weakness. He felt like he had some 'fullness' on the R side of his face but no facial droop, numbness, slurred speech difficulty swallowing or walking.   Past Medical History  Diagnosis Date  . Colon cancer 2003    specialist due for repeat colonoscopy in 2012  . Anxiety     takes approximately 10 diazepam/month-contracts to no more than that amount  . Depression   . Hyperlipidemia   . BPH (benign prostatic hypertrophy)     sees urology  . Overactive bladder   . Multiple fractures 08/2008    patient fell off a rood about 13 feet onto concrete-multiple fractures including vertrebral  . History of kidney stones   . Insomnia    Past Surgical History  Procedure Laterality Date  . Hip fracture surgery    . Elbow surgery    . Hernia repair    . Colectomy  02/2002    sigmoid colectomy   Family History  Problem Relation Age of Onset  . Coronary artery disease Father   . Other Neg Hx     no FH od colon cancer   History  Substance Use Topics  . Smoking status: Former Games developer  . Smokeless tobacco: Never Used     Comment: quit tobacco 2000 smoked for 20 years  . Alcohol Use: No     Comment: former drinker    Review of Systems All other systems reviewed and are negative except as noted in HPI.   Allergies  Review of patient's allergies indicates no known allergies.  Home Medications   Current Outpatient Rx  Name  Route  Sig  Dispense  Refill  . cholestyramine (QUESTRAN) 4 GM/DOSE powder   Oral   Take 1 g by mouth 4 (four) times daily as needed.         . diazepam (VALIUM) 5 MG  tablet   Oral   Take 1 tablet (5 mg total) by mouth at bedtime.   90 tablet   1   . escitalopram (LEXAPRO) 20 MG tablet   Oral   Take 1 tablet (20 mg total) by mouth daily.   30 tablet   5   . gabapentin (NEURONTIN) 300 MG capsule   Oral   Take 1 capsule (300 mg total) by mouth 2 (two) times daily.   60 capsule   5   . HYDROcodone-acetaminophen (NORCO/VICODIN) 5-325 MG per tablet   Oral   Take 1-2 tablets by mouth every 4 (four) hours as needed for pain.   25 tablet   1   . lactobacillus acidophilus (BACID) TABS   Oral   Take 1 tablet by mouth daily.         . trazodone (DESYREL) 300 MG tablet      TAKE 1 TABLET BY MOUTH AT BEDTIME   30 tablet   5    BP 140/82  Temp(Src) 98.6 F (37 C) (Oral)  Resp 16  Ht 5\' 11"  (1.803 m)  Wt 175 lb (79.379 kg)  BMI 24.42 kg/m2  SpO2 100% Physical Exam  Nursing note and vitals reviewed. Constitutional:  He is oriented to person, place, and time. He appears well-developed and well-nourished.  HENT:  Head: Normocephalic and atraumatic.  Eyes: EOM are normal. Pupils are equal, round, and reactive to light.  Neck: Normal range of motion. Neck supple.  Cardiovascular: Normal rate, normal heart sounds and intact distal pulses.   Pulmonary/Chest: Effort normal and breath sounds normal.  Abdominal: Bowel sounds are normal. He exhibits no distension. There is no tenderness.  Musculoskeletal: Normal range of motion. He exhibits no edema and no tenderness.  Neurological: He is alert and oriented to person, place, and time. He has normal strength. He displays normal reflexes. No cranial nerve deficit or sensory deficit (paresthesia to R 1-3rd digit and radial side of 4th digit). Coordination normal.  Skin: Skin is warm and dry. No rash noted.  Psychiatric: He has a normal mood and affect.    ED Course  Procedures (including critical care time) Labs Reviewed - No data to display No results found. 1. Carpal tunnel syndrome, left      MDM  No focal deficits. He has paresthesias in a median nerve distribution. No facial asymmetry. No concern for stroke. Pt has a likely median nerve compression/inflammation but cannot take NSAIDs. He is taking Gabapentin already, advised to continue same and add APAP if needed for pain. PCP follow up. May need EMG studies if symptoms persist.   Nadine Ryle B. Bernette Mayers, MD 04/13/13 1530

## 2013-06-28 ENCOUNTER — Other Ambulatory Visit: Payer: Self-pay | Admitting: Family

## 2013-06-28 NOTE — Telephone Encounter (Signed)
Ok to send #30 with no additional refills please.

## 2013-06-30 NOTE — Telephone Encounter (Signed)
Rx called to pharmacy voicemail as below. 

## 2013-07-29 ENCOUNTER — Other Ambulatory Visit: Payer: Self-pay | Admitting: Family

## 2013-07-29 NOTE — Telephone Encounter (Signed)
OK to send 30 tabs with zero refills.  

## 2013-07-29 NOTE — Telephone Encounter (Signed)
Rx called to pharmacy voicemail. 

## 2013-08-02 ENCOUNTER — Other Ambulatory Visit: Payer: Self-pay | Admitting: Family

## 2013-08-28 ENCOUNTER — Other Ambulatory Visit: Payer: Self-pay | Admitting: Family

## 2013-08-29 NOTE — Telephone Encounter (Signed)
OK to send #30 no refills.  

## 2013-08-29 NOTE — Telephone Encounter (Signed)
Rx called to pharmacy voicemail. 

## 2013-09-23 ENCOUNTER — Telehealth: Payer: Self-pay | Admitting: Family

## 2013-09-23 ENCOUNTER — Telehealth: Payer: Self-pay | Admitting: *Deleted

## 2013-09-23 MED ORDER — HYDROCODONE-ACETAMINOPHEN 5-325 MG PO TABS: 1.0000 | ORAL_TABLET | ORAL | Status: DC | PRN

## 2013-09-23 NOTE — Telephone Encounter (Signed)
Refill diazapam 5mg  take 1 per day  Qty 30

## 2013-09-23 NOTE — Telephone Encounter (Signed)
Pt left message that he recently saw Dr Norma Fredrickson and found he has a cracked rib. Pt is requesting a pain medication. Please advise.

## 2013-09-23 NOTE — Telephone Encounter (Signed)
Reviewed x ray from Dr. Norma Fredrickson.  Notes nondisplaced fracture of the anterolateral aspect of the left 8th rib.  Lungs look good.  Pt reports that he hurt rib and heard it crack while moving furniture.  Has pain.  Requesting rx for pain. Advised pt I would leave rx at the front desk for hydrocodone.  He will come pick up this afternoon.

## 2013-09-26 ENCOUNTER — Other Ambulatory Visit: Payer: Self-pay | Admitting: *Deleted

## 2013-09-26 MED ORDER — DIAZEPAM 5 MG PO TABS: 5.0000 mg | ORAL_TABLET | Freq: Every day | ORAL | Status: DC

## 2013-10-04 ENCOUNTER — Ambulatory Visit (INDEPENDENT_AMBULATORY_CARE_PROVIDER_SITE_OTHER): Payer: Managed Care, Other (non HMO) | Admitting: Family

## 2013-10-04 ENCOUNTER — Other Ambulatory Visit: Payer: Self-pay | Admitting: Family

## 2013-10-04 ENCOUNTER — Encounter: Payer: Self-pay | Admitting: Family

## 2013-10-04 VITALS — BP 110/80 | HR 67 | Temp 97.9°F | Resp 16 | Ht 71.0 in | Wt 178.1 lb

## 2013-10-04 DIAGNOSIS — R739 Hyperglycemia, unspecified: Secondary | ICD-10-CM

## 2013-10-04 DIAGNOSIS — F329 Major depressive disorder, single episode, unspecified: Secondary | ICD-10-CM

## 2013-10-04 DIAGNOSIS — F3289 Other specified depressive episodes: Secondary | ICD-10-CM

## 2013-10-04 DIAGNOSIS — L989 Disorder of the skin and subcutaneous tissue, unspecified: Secondary | ICD-10-CM

## 2013-10-04 DIAGNOSIS — Z0000 Encounter for general adult medical examination without abnormal findings: Secondary | ICD-10-CM

## 2013-10-04 DIAGNOSIS — R7309 Other abnormal glucose: Secondary | ICD-10-CM

## 2013-10-04 DIAGNOSIS — E739 Lactose intolerance, unspecified: Secondary | ICD-10-CM

## 2013-10-04 DIAGNOSIS — Z23 Encounter for immunization: Secondary | ICD-10-CM

## 2013-10-04 HISTORY — DX: Disorder of the skin and subcutaneous tissue, unspecified: L98.9

## 2013-10-04 LAB — CBC WITH DIFFERENTIAL/PLATELET
Eosinophils Absolute: 0.1 10*3/uL (ref 0.0–0.7)
HCT: 44.7 % (ref 39.0–52.0)
Lymphocytes Relative: 16 % (ref 12–46)
Lymphs Abs: 1 10*3/uL (ref 0.7–4.0)
Monocytes Absolute: 0.4 10*3/uL (ref 0.1–1.0)
Monocytes Relative: 7 % (ref 3–12)
Neutro Abs: 4.5 10*3/uL (ref 1.7–7.7)
Neutrophils Relative %: 75 % (ref 43–77)
Platelets: 195 10*3/uL (ref 150–400)
RBC: 4.81 MIL/uL (ref 4.22–5.81)
WBC: 5.9 10*3/uL (ref 4.0–10.5)

## 2013-10-04 LAB — BASIC METABOLIC PANEL WITH GFR
BUN: 14 mg/dL (ref 6–23)
CO2: 31 mEq/L (ref 19–32)
Calcium: 9.1 mg/dL (ref 8.4–10.5)
Glucose, Bld: 94 mg/dL (ref 70–99)
Potassium: 4.4 mEq/L (ref 3.5–5.3)
Sodium: 142 mEq/L (ref 135–145)

## 2013-10-04 LAB — HEPATIC FUNCTION PANEL
ALT: 9 U/L (ref 0–53)
AST: 12 U/L (ref 0–37)
Bilirubin, Direct: 0.1 mg/dL (ref 0.0–0.3)
Indirect Bilirubin: 0.6 mg/dL (ref 0.0–0.9)
Total Protein: 6.4 g/dL (ref 6.0–8.3)

## 2013-10-04 LAB — HEMOGLOBIN A1C: Mean Plasma Glucose: 108 mg/dL (ref <117)

## 2013-10-04 LAB — LIPID PANEL
LDL Cholesterol: 92 mg/dL (ref 0–99)
VLDL: 15 mg/dL (ref 0–40)

## 2013-10-04 LAB — TSH: TSH: 0.854 u[IU]/mL (ref 0.350–4.500)

## 2013-10-04 NOTE — Progress Notes (Signed)
Subjective:    Patient ID: Justin [name], male    DOB: 28-Sep-1956, 57 y.o.   MRN: [id number]  HPI  Justin [name] is a 57 yr old male who presents today for CPX.  Immunizations: due for flu shot Diet: reports diet is generally healthy Exercise: active in job no formal exercise.  Colonoscopy:2014- neg per pt. Was completed by Dr. Norma Fredrickson.   Review of Systems  Constitutional: Negative for unexpected weight change.  HENT: Positive for hearing loss. Negative for postnasal drip.        Declines formal hearing eval  Eyes: Negative for visual disturbance.  Respiratory: Negative for cough and shortness of breath.   Cardiovascular: Negative for chest pain.  Gastrointestinal: Negative for nausea, diarrhea and constipation.  Genitourinary: Negative for dysuria and frequency.  Musculoskeletal: Negative for myalgias.       Back/ankle back, hip arm.     Skin: Negative for rash.       Lesion left dorsal hand  Neurological: Negative for headaches.  Hematological: Negative for adenopathy.  Psychiatric/Behavioral:       Reports mild depression- stopped lexapro several months ago. Denies SI/HI.    Past Medical History  Diagnosis Date  . Colon cancer 2003    specialist due for repeat colonoscopy in 2012  . Anxiety     takes approximately 10 diazepam/month-contracts to no more than that amount  . Depression   . Hyperlipidemia   . BPH (benign prostatic hypertrophy)     sees urology  . Overactive bladder   . Multiple fractures 08/2008    patient fell off a rood about 13 feet onto concrete-multiple fractures including vertrebral  . History of kidney stones   . Insomnia     History   Social History  . Marital Status: Married    Spouse Name: N/A    Number of Children: N/A  . Years of Education: N/A   Occupational History  . Not on file.   Social History Main Topics  . Smoking status: Former Games developer  . Smokeless tobacco: Never Used     Comment: quit tobacco 2000 smoked for 20  years  . Alcohol Use: No     Comment: former drinker  . Drug Use: No  . Sexual Activity: Not on file   Other Topics Concern  . 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 Hip fracture surgery    . Elbow surgery    . Hernia repair    . Colectomy  02/2002    sigmoid colectomy    Family History  Problem Relation Age of Onset  . Coronary artery disease Father   . Other Neg Hx     no FH od colon cancer    No Known Allergies  Current Outpatient Prescriptions on File Prior to Visit  Medication Sig Dispense Refill  . cholestyramine (QUESTRAN) 4 GM/DOSE powder Take 1 g by mouth 4 (four) times daily as needed.      . diazepam (VALIUM) 5 MG tablet Take 1 tablet (5 mg total) by mouth at bedtime.  30 tablet  0  .  gabapentin (NEURONTIN) 300 MG capsule Take 1 capsule (300 mg total) by mouth 2 (two) times daily.  60 capsule  5  . HYDROcodone-acetaminophen (NORCO/VICODIN) 5-325 MG per tablet Take 1-2 tablets by mouth every 4 (four) hours as needed.  25 tablet  0  . HYDROcodone-acetaminophen (NORCO/VICODIN) 5-325 MG per tablet Take 1-2 tablets by mouth every 4 (four) hours as needed for pain.      [name] Kitchen lactobacillus acidophilus (BACID) TABS Take 1 tablet by mouth daily.      . trazodone (DESYREL) 300 MG tablet TAKE 1 TABLET BY MOUTH AT BEDTIME  30 tablet  5   No current facility-administered medications on file prior to visit.    BP 110/80  Pulse 67  Temp(Src) 97.9 F (36.6 C) (Oral)  Resp 16  Ht 5\' 11"  (1.803 m)  Wt 178 lb 1.9 oz (80.795 kg)  BMI 24.85 kg/m2  SpO2 96%       Objective:   Physical Exam  Constitutional: He is oriented to person, place, and time. He appears  well-developed and well-nourished. No distress.  Cardiovascular: Normal rate and regular rhythm.   No murmur heard. Pulmonary/Chest: Effort normal and breath sounds normal. No respiratory distress. He has no wheezes. He has no rales. He exhibits no tenderness.  Neurological: He is alert and oriented to person, place, and time.  Skin: Skin is warm and dry.  Psychiatric: He has a normal mood and affect. His behavior is normal. Judgment and thought content normal.          Assessment & Plan:

## 2013-10-04 NOTE — Patient Instructions (Signed)
Please complete lab work prior to leaving. Try to get 30 minutes of exercise 5 days a week. Follow up in 6 months, sooner if problems or concerns.

## 2013-10-04 NOTE — Assessment & Plan Note (Signed)
Fair control. Does not wish to restart lexapro at this time. Advised pt to let us know if symptoms worsen.

## 2013-10-04 NOTE — Assessment & Plan Note (Addendum)
Discussed healthy diet, exercise.  Flu shot today. Obtain fasting labs, PSA.

## 2013-10-04 NOTE — Assessment & Plan Note (Signed)
Advised pt to schedule biopsy.

## 2013-10-04 NOTE — Progress Notes (Signed)
Pre visit review using our clinic review tool, if applicable. No additional management support is needed unless otherwise documented below in the visit note. 

## 2013-10-04 NOTE — Assessment & Plan Note (Signed)
Check A1C 

## 2013-10-05 ENCOUNTER — Encounter: Payer: Self-pay | Admitting: Family

## 2013-10-05 LAB — URINALYSIS, ROUTINE W REFLEX MICROSCOPIC
Glucose, UA: NEGATIVE mg/dL
Hgb urine dipstick: NEGATIVE
Ketones, ur: NEGATIVE mg/dL
Leukocytes, UA: NEGATIVE
Nitrite: NEGATIVE
Specific Gravity, Urine: 1.019 (ref 1.005–1.030)
pH: 7 (ref 5.0–8.0)

## 2013-10-06 ENCOUNTER — Encounter: Payer: Self-pay | Admitting: Family

## 2013-10-17 ENCOUNTER — Other Ambulatory Visit: Payer: Self-pay | Admitting: Family

## 2013-10-17 DIAGNOSIS — F419 Anxiety disorder, unspecified: Secondary | ICD-10-CM

## 2013-10-17 DIAGNOSIS — G4700 Insomnia, unspecified: Secondary | ICD-10-CM

## 2013-10-24 ENCOUNTER — Other Ambulatory Visit: Payer: Self-pay | Admitting: Family

## 2013-10-24 NOTE — Telephone Encounter (Signed)
eScribe request for refill on Diazepam Last filled - 12.08.14, #30x0 Last AEX - 12.16.14 Next AEX - 6 month Please Advise/SLS

## 2013-10-24 NOTE — Telephone Encounter (Signed)
OK to send 30 tabs zero refills.  

## 2013-10-25 NOTE — Telephone Encounter (Signed)
Rx called to pharmacy voicemail. 

## 2013-10-26 ENCOUNTER — Ambulatory Visit (INDEPENDENT_AMBULATORY_CARE_PROVIDER_SITE_OTHER): Payer: Managed Care, Other (non HMO) | Admitting: Family

## 2013-10-26 ENCOUNTER — Encounter: Payer: Self-pay | Admitting: Family

## 2013-10-26 VITALS — BP 118/80 | HR 95 | Temp 98.3°F | Resp 16 | Ht 71.0 in | Wt 180.1 lb

## 2013-10-26 DIAGNOSIS — B9789 Other viral agents as the cause of diseases classified elsewhere: Secondary | ICD-10-CM

## 2013-10-26 DIAGNOSIS — B349 Viral infection, unspecified: Secondary | ICD-10-CM

## 2013-10-26 LAB — POCT INFLUENZA A/B
INFLUENZA B, POC: NEGATIVE
Influenza A, POC: NEGATIVE

## 2013-10-26 NOTE — Addendum Note (Signed)
Addended by: Kelle Darting A on: 10/26/2013 10:00 AM   Modules accepted: Orders

## 2013-10-26 NOTE — Patient Instructions (Signed)
Call if symptoms worsen or if not improved in 2-3 days. You may use imodium as needed for diarrhea, tylenol or motrin as needed for pain/comfort. Drink plenty of fluids and get plenty of rest.

## 2013-10-26 NOTE — Assessment & Plan Note (Addendum)
I suspect that he picked up a new virus as his URI was resolving. Advised pt to use imodium prn, tylenol or motrin as needed for myalgia/comfort, drink plenty of fluids/rest. Call if symptoms worsen or if not improved in 2-3 days. Rapid flu negative.

## 2013-10-26 NOTE — Progress Notes (Signed)
Subjective:    Patient ID: Justin [name], male    DOB: 1956-04-12, 58 y.o.   MRN: [id number]  HPI  Mr.  Lindeman is a 58 yr old male presents today with the following complaints: Recently had a "cold" but noted over the weekend cold symptoms seemed to be subsiding.  Last night started to feel bad, "diarrhea, soreness, runny nose, couldn't sleep at all last night." He denies associated fever. Reports associated "scratchy throat."  He did have the flu shot this season.    Review of Systems See HPI  Past Medical History  Diagnosis Date  . Colon cancer 2003    specialist due for repeat colonoscopy in 2012  . Anxiety     takes approximately 10 diazepam/month-contracts to no more than that amount  . Depression   . Hyperlipidemia   . BPH (benign prostatic hypertrophy)     sees urology  . Overactive bladder   . Multiple fractures 08/2008    patient fell off a rood about 13 feet onto concrete-multiple fractures including vertrebral  . History of kidney stones   . Insomnia     History   Social History  . Marital Status: Married    Spouse Name: N/A    Number of Children: N/A  . Years of Education: N/A   Occupational History  . Not on file.   Social History Main Topics  . Smoking status: Former Research scientist (life sciences)  . Smokeless tobacco: Never Used     Comment: quit tobacco 2000 smoked for 20 years  . Alcohol Use: No     Comment: former drinker  . Drug Use: No  . Sexual Activity: Not on file   Other Topics Concern  . 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 Hip fracture surgery    . Elbow surgery    . Hernia repair    . Colectomy  02/2002    sigmoid colectomy    Family  History  Problem Relation Age of Onset  . Coronary artery disease Father   . Other Neg Hx     no FH od colon cancer    No Known Allergies  Current Outpatient Prescriptions on File Prior to Visit  Medication Sig Dispense Refill  . diazepam (VALIUM) 5 MG tablet TAKE 1 TABLET BY MOUTH AT BEDTIME  30 tablet  0  . gabapentin (NEURONTIN) 300 MG capsule Take 1 capsule (300 mg total) by mouth 2 (two) times daily.  60 capsule  5  . HYDROcodone-acetaminophen (NORCO/VICODIN) 5-325 MG per tablet Take 1-2 tablets by mouth every 4 (four) hours as needed.  25 tablet  0  . trazodone (DESYREL) 300 MG tablet TAKE 1 TABLET BY MOUTH AT BEDTIME  30 tablet  3  . cholestyramine (QUESTRAN) 4 GM/DOSE powder Take 1 g by mouth 4 (four) times daily as needed.       No current facility-administered medications on file prior to visit.    BP 118/80  Pulse 95  Temp(Src) 98.3 F (36.8 C) (Oral)  Resp 16  Ht 5\' 11"  (1.803 m)  Wt 180 lb 1.9 oz (  81.702 kg)  BMI 25.13 kg/m2  SpO2 98%       Objective:   Physical Exam  Constitutional: He is oriented to person, place, and time. He appears well-developed and well-nourished. No distress.  HENT:  Head: Normocephalic and atraumatic.  Right Ear: Tympanic membrane and ear canal normal.  Left Ear: Tympanic membrane and ear canal normal.  Mouth/Throat: Posterior oropharyngeal erythema present. No oropharyngeal exudate or posterior oropharyngeal edema.  Cardiovascular: Normal rate and regular rhythm.   No murmur heard. Pulmonary/Chest: Effort normal and breath sounds normal. No respiratory distress. He has no wheezes. He has no rales. He exhibits no tenderness.  Abdominal:  Abdomen is mildly firm but non-tender without guarding.   Musculoskeletal: He exhibits no edema.  Lymphadenopathy:    He has no cervical adenopathy.  Neurological: He is alert and oriented to person, place, and time.  Skin: Skin is warm and dry.  Psychiatric: He has a normal mood and affect.  His behavior is normal. Judgment and thought content normal.          Assessment & Plan:

## 2013-10-26 NOTE — Progress Notes (Signed)
Pre visit review using our clinic review tool, if applicable. No additional management support is needed unless otherwise documented below in the visit note. 

## 2013-11-22 ENCOUNTER — Other Ambulatory Visit: Payer: Self-pay | Admitting: Family

## 2013-11-22 NOTE — Telephone Encounter (Signed)
Please advise.  Medication name:  Name from pharmacy:  diazepam (VALIUM) 5 MG tablet  DIAZEPAM 5 MG TABLET Sig: TAKE 1 TABLET BY MOUTH AT BEDTIME Dispense: 30 tablet Refills: 0 Start: 11/22/2013 Class: Normal Requested on: 10/25/2013 Originally ordered on: 03/20/2011 Last refill: 10/25/2013

## 2013-11-23 NOTE — Telephone Encounter (Signed)
rx is called to pharmacy voicemail.

## 2013-11-23 NOTE — Telephone Encounter (Signed)
OK to send 30 tabs with zero refills.  

## 2013-12-20 ENCOUNTER — Other Ambulatory Visit: Payer: Self-pay | Admitting: Family

## 2013-12-20 NOTE — Telephone Encounter (Signed)
Refill called to pharmacy voicemail as below. 

## 2013-12-20 NOTE — Telephone Encounter (Signed)
Ok to send 30 tabs with zero refills.  

## 2013-12-29 ENCOUNTER — Telehealth: Payer: Self-pay | Admitting: *Deleted

## 2013-12-29 NOTE — Telephone Encounter (Signed)
Refill request for Hydrocodone-APAP 5-325 mg Last filled by MD on - 12.05.14, #25x0  See Phone Note:  Justin Alar, NP at 09/23/2013 2:45 PM    Status: Signed       Reviewed x ray from Dr. Alice Reichert. Notes nondisplaced fracture of the anterolateral aspect of the left 8th rib. Lungs look good.  Pt reports that he hurt rib and heard it crack while moving furniture. Has pain. Requesting rx for pain. Advised pt I would leave rx at the front desk for hydrocodone. He will come pick up this afternoon.     Justin [name], CMA at 09/23/2013 1:05 PM     Status: Signed        Pt left message that he recently saw Dr Alice Reichert and found he has a cracked rib. Pt is requesting a pain medication. Please advise.      Last AEX - 12.16.15 Next AEX - 6 Months Please Advise/SLS

## 2013-12-30 NOTE — Telephone Encounter (Signed)
Spoke with pt. He states he is requesting Rx due to his back pain. He states that he only takes the pain medication when it flares up.  Please advise.

## 2013-12-30 NOTE — Telephone Encounter (Signed)
Please ask pt where he is experiencing pain? We last gave to him 1 time rx for rib pain in December.  If he is needing for chronic pain, then I will need to refer him to pain clinic.

## 2014-01-02 MED ORDER — HYDROCODONE-ACETAMINOPHEN 5-325 MG PO TABS: 1.0000 | ORAL_TABLET | ORAL | Status: DC | PRN

## 2014-01-02 NOTE — Telephone Encounter (Signed)
Can send 15 tabs only.

## 2014-01-02 NOTE — Telephone Encounter (Signed)
Notified pt he can pick up Rx tomorrow. Rx printed and forwarded to Provider for signature.

## 2014-01-03 NOTE — Telephone Encounter (Signed)
Rx sent to front desk for pick up.

## 2014-01-20 ENCOUNTER — Other Ambulatory Visit: Payer: Self-pay | Admitting: Family

## 2014-01-23 NOTE — Telephone Encounter (Signed)
Rx called to pharmacy voicemail, #30 x no refills. 

## 2014-03-23 ENCOUNTER — Other Ambulatory Visit: Payer: Self-pay | Admitting: Family

## 2014-03-23 NOTE — Telephone Encounter (Signed)
OK to send 30 tabs with zero refills.  

## 2014-03-23 NOTE — Telephone Encounter (Signed)
Rx request phoned to pharmacy/SLS  

## 2014-03-23 NOTE — Telephone Encounter (Signed)
eScribe request from [facility] for refill on Diazepam Last filled - 04.03.15, #30x0 Last AEX - 12.16.14 Next AEX - 6-mths [appt scheduled for 06.23.15] Please Advise on refills/SLS

## 2014-04-04 ENCOUNTER — Ambulatory Visit: Payer: Self-pay | Admitting: Family

## 2014-04-11 ENCOUNTER — Encounter: Payer: Self-pay | Admitting: Family

## 2014-04-11 ENCOUNTER — Ambulatory Visit (INDEPENDENT_AMBULATORY_CARE_PROVIDER_SITE_OTHER): Payer: Managed Care, Other (non HMO) | Admitting: Family

## 2014-04-11 ENCOUNTER — Telehealth: Payer: Self-pay | Admitting: *Deleted

## 2014-04-11 VITALS — BP 120/84 | HR 77 | Temp 98.7°F | Ht 71.0 in | Wt 177.0 lb

## 2014-04-11 DIAGNOSIS — K589 Irritable bowel syndrome without diarrhea: Secondary | ICD-10-CM

## 2014-04-11 DIAGNOSIS — F341 Dysthymic disorder: Secondary | ICD-10-CM

## 2014-04-11 DIAGNOSIS — Z0000 Encounter for general adult medical examination without abnormal findings: Secondary | ICD-10-CM

## 2014-04-11 DIAGNOSIS — F418 Other specified anxiety disorders: Secondary | ICD-10-CM

## 2014-04-11 DIAGNOSIS — M549 Dorsalgia, unspecified: Secondary | ICD-10-CM

## 2014-04-11 MED ORDER — TADALAFIL 10 MG PO TABS: ORAL_TABLET | ORAL | Status: DC

## 2014-04-11 MED ORDER — ESCITALOPRAM OXALATE 10 MG PO TABS: ORAL_TABLET | ORAL | Status: DC

## 2014-04-11 MED ORDER — HYDROCODONE-ACETAMINOPHEN 5-325 MG PO TABS: 1.0000 | ORAL_TABLET | ORAL | Status: DC | PRN

## 2014-04-11 MED ORDER — DIAZEPAM 5 MG PO TABS: ORAL_TABLET | ORAL | Status: DC

## 2014-04-11 NOTE — Assessment & Plan Note (Signed)
Continue sparing use of hydrocodone.

## 2014-04-11 NOTE — Progress Notes (Signed)
Pre visit review using our clinic review tool, if applicable. No additional management support is needed unless otherwise documented below in the visit note. 

## 2014-04-11 NOTE — Progress Notes (Signed)
Subjective:    Patient ID: Justin [name], male    DOB: [birth year], 58 y.o.   MRN: [id number]  HPI  Justin [name] is a 58 yr old male who presents today for follow up.   Anxiety/depression- pt reports that lately he has not felt happy. Feels like his depression is starting back.  Has been on lexapro in the past and tolerated. Denies SI/HI. Would like to retry lexapro. Also notes some anxiety.  Work seems to be at trigger for anxiety. Continues valium HS for sleep. Denies SI/HI.  Chronic pain- using hydrocodone prn for back pain and left heel pain.   IBS-reports that this has been stable.     Review of Systems    see HPI  Past Medical History  Diagnosis Date  . Colon cancer 2003    specialist due for repeat colonoscopy in 2012  . Anxiety     takes approximately 10 diazepam/month-contracts to no more than that amount  . Depression   . Hyperlipidemia   . BPH (benign prostatic hypertrophy)     sees urology  . Overactive bladder   . Multiple fractures 08/2008    patient fell off a rood about 13 feet onto concrete-multiple fractures including vertrebral  . History of kidney stones   . Insomnia     History   Social History  . Marital Status: Married    Spouse Name: N/A    Number of Children: N/A  . Years of Education: N/A   Occupational History  . Not on file.   Social History Main Topics  . Smoking status: Former Research scientist (life sciences)  . Smokeless tobacco: Never Used     Comment: quit tobacco 2000 smoked for 20 years  . Alcohol Use: No     Comment: former drinker  . Drug Use: No  . Sexual Activity: Not on file   Other Topics Concern  . 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 Hip fracture surgery    . Elbow surgery    . Hernia repair    . Colectomy  02/2002    sigmoid colectomy    Family History  Problem Relation Age of Onset  . Coronary artery disease Father   . Other Neg Hx     no FH od colon cancer    No Known Allergies  Current Outpatient Prescriptions on File Prior to Visit  Medication Sig Dispense Refill  . diazepam (VALIUM) 5 MG tablet TAKE 1 TABLET BY MOUTH AT BEDTIME  30 tablet  0  . gabapentin (NEURONTIN) 300 MG capsule Take 1 capsule (300 mg total) by mouth 2 (two) times daily.  60 capsule  5  . trazodone (DESYREL) 300 MG tablet TAKE 1 TABLET BY MOUTH AT BEDTIME  30 tablet  3   No current facility-administered medications on file prior to visit.    BP 120/84  Pulse 77  Temp(Src) 98.7 F (37.1 C) (Oral)  Ht 5\' 11"  (1.803 m)  Wt 177 lb (80.287 kg)  BMI 24.70 kg/m2  SpO2 95%    Objective:   Physical Exam  Constitutional: He  is oriented to person, place, and time. He appears well-developed and well-nourished. No distress.  Cardiovascular: Normal rate and regular rhythm.   No murmur heard. Pulmonary/Chest: Effort normal and breath sounds normal. No respiratory distress. He has no wheezes. He has no rales. He exhibits no tenderness.  Neurological: He is alert and oriented to person, place, and time.  Psychiatric: His speech is normal and behavior is normal. Judgment normal. Cognition and memory are normal.  Flat affect.          Assessment & Plan:

## 2014-04-11 NOTE — Patient Instructions (Signed)
Start lexapro- 1/2 tab by mouth once daily x 1 week, increase to a full tab once daily on week two. Go to the ER if you develop thoughts of hurting yourself or others. Follow up in 1 month.

## 2014-04-11 NOTE — Telephone Encounter (Signed)
Patient called office because when he went to pick-up the rx for Cialis, the rx was over $500. Patient wanted to know if there is something else he can try?

## 2014-04-11 NOTE — Telephone Encounter (Signed)
Patent notified. Patient stated that he will try web site.

## 2014-04-11 NOTE — Telephone Encounter (Signed)
I would advise him to try going to [employer]rx[facility] and printing off a coupon for cialis to try.  He can bring it to his pharmacy.  Let me know if this does not help.

## 2014-04-11 NOTE — Assessment & Plan Note (Signed)
Deteriorated. Trial of lexapro 10mg .  I instructed pt to start 1/2 tablet once daily for 1 week and then increase to a full tablet once daily on week two as tolerated.   Plan follow up in 1 month to evaluate progress.

## 2014-04-11 NOTE — Assessment & Plan Note (Signed)
Stable.  Monitor.  

## 2014-04-12 ENCOUNTER — Other Ambulatory Visit: Payer: Self-pay | Admitting: Physician Assistant

## 2014-04-26 ENCOUNTER — Telehealth: Payer: Self-pay | Admitting: *Deleted

## 2014-04-26 MED ORDER — GABAPENTIN 300 MG PO CAPS: 300.0000 mg | ORAL_CAPSULE | Freq: Two times a day (BID) | ORAL | Status: DC

## 2014-04-26 NOTE — Telephone Encounter (Signed)
Received message from [facility] that pt is requesting refill of gabapentin 300mg  twice a day. Last refill 01/11/14. Refill sent.

## 2014-05-09 ENCOUNTER — Encounter: Payer: Self-pay | Admitting: Family

## 2014-05-09 ENCOUNTER — Ambulatory Visit (INDEPENDENT_AMBULATORY_CARE_PROVIDER_SITE_OTHER): Payer: Managed Care, Other (non HMO) | Admitting: Family

## 2014-05-09 VITALS — BP 114/80 | HR 62 | Temp 98.0°F | Resp 16 | Ht 71.0 in | Wt 176.0 lb

## 2014-05-09 DIAGNOSIS — L219 Seborrheic dermatitis, unspecified: Secondary | ICD-10-CM

## 2014-05-09 DIAGNOSIS — F418 Other specified anxiety disorders: Secondary | ICD-10-CM

## 2014-05-09 DIAGNOSIS — C189 Malignant neoplasm of colon, unspecified: Secondary | ICD-10-CM

## 2014-05-09 DIAGNOSIS — L218 Other seborrheic dermatitis: Secondary | ICD-10-CM

## 2014-05-09 DIAGNOSIS — F341 Dysthymic disorder: Secondary | ICD-10-CM

## 2014-05-09 HISTORY — DX: Seborrheic dermatitis, unspecified: L21.9

## 2014-05-09 MED ORDER — ESCITALOPRAM OXALATE 20 MG PO TABS: 20.0000 mg | ORAL_TABLET | Freq: Every day | ORAL | Status: DC

## 2014-05-09 NOTE — Patient Instructions (Signed)
Increase lexapro to 20mg  once daily. Start selsun blue and scalpicin. Follow up in 6 weeks, sooner if problems/concerns.

## 2014-05-09 NOTE — Progress Notes (Signed)
Subjective:    Patient ID: Justin [name], male    DOB: 1956-01-13, 58 y.o.   MRN: [id number]  HPI  Justin [name] is a 58 yr old male who presents today for follow up of depression/anxiety.   Last visit he reported that depression symptoms had worsened.  Lexapro was initiated.Feels overworked.  Notes that he wakes up frequently during the night.    Scalp itching- has been using Neutrogena T gel without much improvement of his scalp itching. He spoke to dermatology who recommended scalpicin. He has not yet tried th scalpicin. Denies associated dandruff or scaling of the scalp skin.     Review of Systems See HPI  Past Medical History  Diagnosis Date  . Colon cancer 2003    specialist due for repeat colonoscopy in 2012  . Anxiety     takes approximately 10 diazepam/month-contracts to no more than that amount  . Depression   . Hyperlipidemia   . BPH (benign prostatic hypertrophy)     sees urology  . Overactive bladder   . Multiple fractures 08/2008    patient fell off a rood about 13 feet onto concrete-multiple fractures including vertrebral  . History of kidney stones   . Insomnia     History   Social History  . Marital Status: Married    Spouse Name: N/A    Number of Children: N/A  . Years of Education: N/A   Occupational History  . Not on file.   Social History Main Topics  . Smoking status: Former Research scientist (life sciences)  . Smokeless tobacco: Never Used     Comment: quit tobacco 2000 smoked for 20 years  . Alcohol Use: No     Comment: former drinker  . Drug Use: No  . Sexual Activity: Not on file   Other Topics Concern  . 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 Hip  fracture surgery    . Elbow surgery    . Hernia repair    . Colectomy  02/2002    sigmoid colectomy    Family History  Problem Relation Age of Onset  . Coronary artery disease Father   . Other Neg Hx     no FH od colon cancer    No Known Allergies  Current Outpatient Prescriptions on File Prior to Visit  Medication Sig Dispense Refill  . diazepam (VALIUM) 5 MG tablet TAKE 1 TABLET BY MOUTH AT BEDTIME  30 tablet  0  . gabapentin (NEURONTIN) 300 MG capsule Take 1 capsule (300 mg total) by mouth 2 (two) times daily.  60 capsule  5  . HYDROcodone-acetaminophen (NORCO/VICODIN) 5-325 MG per tablet Take 1-2 tablets by mouth every 4 (four) hours as needed.  15 tablet  0  . tadalafil (CIALIS) 10 MG tablet 1/2 to 1 tab by mouth daily as needed prior to sexual activity.  10 tablet  5  . traZODone (DESYREL) 150 MG tablet TAKE 2 TABLETS (300MG ) BY MOUTH AT BEDTIME  60 tablet  3  . trazodone (DESYREL) 300 MG tablet TAKE 1 TABLET BY MOUTH AT BEDTIME  30 tablet  3   No current facility-administered medications on file prior to visit.    BP 114/80  Pulse 62  Temp(Src) 98 F (36.7 C) (Oral)  Resp 16  Ht 5\' 11"  (1.803 m)  Wt 176 lb (79.833 kg)  BMI 24.56 kg/m2  SpO2 96%       Objective:   Physical Exam  Constitutional: He is oriented to person, place, and time. He appears well-developed and well-nourished. No distress.  HENT:  Head: Normocephalic and atraumatic.  Neurological: He is alert and oriented to person, place, and time.  Skin:  Scalp appears normal without scaling/lesions.  No nits noted.  Psychiatric: He has a normal mood and affect. His behavior is normal. Judgment and thought content normal.          Assessment & Plan:

## 2014-05-09 NOTE — Progress Notes (Signed)
Pre visit review using our clinic review tool, if applicable. No additional management support is needed unless otherwise documented below in the visit note. 

## 2014-05-09 NOTE — Assessment & Plan Note (Signed)
Only slight improvement in symptoms on the lexapro 10.  He has been on 20mg  in the past.  Will increase to 20mg  and plan to bring him back in 6 weeks for re-evaluation.

## 2014-05-09 NOTE — Assessment & Plan Note (Signed)
Recommended trial of selsun blue and scalpicin.

## 2014-05-22 ENCOUNTER — Other Ambulatory Visit: Payer: Self-pay | Admitting: Family

## 2014-05-22 NOTE — Telephone Encounter (Signed)
Ok to send 30 tabs zero refills. 

## 2014-05-23 NOTE — Telephone Encounter (Signed)
Rx called to pharmacy voicemail. 

## 2014-06-19 ENCOUNTER — Telehealth: Payer: Self-pay | Admitting: Family

## 2014-06-20 ENCOUNTER — Ambulatory Visit: Payer: Self-pay | Admitting: Family

## 2014-06-20 NOTE — Telephone Encounter (Signed)
Last diazepam Rx 04/11/14, #30. Rx printed and forwarded to Provider for signature.

## 2014-06-21 NOTE — Telephone Encounter (Signed)
Rx faxed to pharmacy 06/20/14 pm.

## 2014-06-27 ENCOUNTER — Ambulatory Visit: Payer: Self-pay | Admitting: Family

## 2014-07-04 ENCOUNTER — Encounter: Payer: Self-pay | Admitting: Family

## 2014-07-04 ENCOUNTER — Ambulatory Visit (INDEPENDENT_AMBULATORY_CARE_PROVIDER_SITE_OTHER): Payer: Managed Care, Other (non HMO) | Admitting: Family

## 2014-07-04 VITALS — BP 128/83 | HR 70 | Temp 98.6°F | Resp 15 | Ht 71.0 in | Wt 177.0 lb

## 2014-07-04 DIAGNOSIS — L219 Seborrheic dermatitis, unspecified: Secondary | ICD-10-CM

## 2014-07-04 DIAGNOSIS — Z23 Encounter for immunization: Secondary | ICD-10-CM

## 2014-07-04 DIAGNOSIS — F341 Dysthymic disorder: Secondary | ICD-10-CM

## 2014-07-04 DIAGNOSIS — L218 Other seborrheic dermatitis: Secondary | ICD-10-CM

## 2014-07-04 DIAGNOSIS — F418 Other specified anxiety disorders: Secondary | ICD-10-CM

## 2014-07-04 DIAGNOSIS — G8929 Other chronic pain: Secondary | ICD-10-CM

## 2014-07-04 MED ORDER — HYDROCODONE-ACETAMINOPHEN 5-325 MG PO TABS: 1.0000 | ORAL_TABLET | ORAL | Status: DC | PRN

## 2014-07-04 MED ORDER — ESCITALOPRAM OXALATE 20 MG PO TABS: 20.0000 mg | ORAL_TABLET | Freq: Every day | ORAL | Status: DC

## 2014-07-04 NOTE — Progress Notes (Signed)
Pre visit review using our clinic review tool, if applicable. No additional management support is needed unless otherwise documented below in the visit note. 

## 2014-07-04 NOTE — Progress Notes (Signed)
Subjective:    Patient ID: Justin [name], male    DOB: Nov 27, 1955, 58 y.o.   MRN: [id number]  HPI  Justin [name] is a 58 yr old male who presents today for follow up anxiety/depression. Last visit lexapro was increased from 10mg  to 20mg . Reports that he does not sleep well on sundays still as this is the night before his 4 day week.  Overall, reports improved mood since lexapro dose was increased. Denies SI/HI.  Scalp itching- changed soap.  Did not start selsun blue.   Uses hydrocodone about once a week for foot/back pain.    Review of Systems    see HPI  Past Medical History  Diagnosis Date  . Colon cancer 2003    specialist due for repeat colonoscopy in 2012  . Anxiety     takes approximately 10 diazepam/month-contracts to no more than that amount  . Depression   . Hyperlipidemia   . BPH (benign prostatic hypertrophy)     sees urology  . Overactive bladder   . Multiple fractures 08/2008    patient fell off a rood about 13 feet onto concrete-multiple fractures including vertrebral  . History of kidney stones   . Insomnia     History   Social History  . Marital Status: Married    Spouse Name: N/A    Number of Children: N/A  . Years of Education: N/A   Occupational History  . Not on file.   Social History Main Topics  . Smoking status: Former Research scientist (life sciences)  . Smokeless tobacco: Never Used     Comment: quit tobacco 2000 smoked for 20 years  . Alcohol Use: No     Comment: former drinker  . Drug Use: No  . Sexual Activity: Not on file   Other Topics Concern  . 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 Hip fracture surgery    . Elbow surgery    . Hernia repair     . Colectomy  02/2002    sigmoid colectomy    Family History  Problem Relation Age of Onset  . Coronary artery disease Father   . Other Neg Hx     no FH od colon cancer    No Known Allergies  Current Outpatient Prescriptions on File Prior to Visit  Medication Sig Dispense Refill  . diazepam (VALIUM) 5 MG tablet TAKE 1 TABLET BY MOUTH AT BEDTIME  30 tablet  0  . escitalopram (LEXAPRO) 20 MG tablet Take 1 tablet (20 mg total) by mouth daily.  30 tablet  1  . gabapentin (NEURONTIN) 300 MG capsule Take 1 capsule (300 mg total) by mouth 2 (two) times daily.  60 capsule  5  . HYDROcodone-acetaminophen (NORCO/VICODIN) 5-325 MG per tablet Take 1-2 tablets by mouth every 4 (four) hours as needed.  15 tablet  0  . tadalafil (CIALIS) 10 MG tablet 1/2 to 1 tab by mouth daily as needed prior to sexual activity.  10 tablet  5  . traZODone (DESYREL) 150 MG tablet TAKE 2 TABLETS (300MG ) BY MOUTH AT BEDTIME  60 tablet  3   No current facility-administered medications on file prior to visit.    BP 128/83  Pulse 70  Temp(Src) 98.6 F (37 C) (Oral)  Resp 15  Ht 5\' 11"  (1.803 m)  Wt 177 lb (80.287 kg)  BMI 24.70 kg/m2  SpO2 98%    Objective:   Physical Exam  Constitutional: He is oriented to person, place, and time. He appears well-developed and well-nourished. No distress.  Neurological: He is alert and oriented to person, place, and time.  Psychiatric: He has a normal mood and affect. His behavior is normal. Judgment and thought content normal.          Assessment & Plan:

## 2014-07-04 NOTE — Assessment & Plan Note (Signed)
Improved on lexapro 20mg . He is satisfied with his current regimen.

## 2014-07-04 NOTE — Assessment & Plan Note (Signed)
Advised trial of selsun blue.

## 2014-07-04 NOTE — Patient Instructions (Signed)
Start using selsun blue for scalp itching. Continue lexapro 20mg  once daily. Please schedule a follow up appointment in 3 months- sooner if symptoms worsen.

## 2014-07-04 NOTE — Assessment & Plan Note (Signed)
Continue sparing use of hydrocodone

## 2014-07-18 ENCOUNTER — Other Ambulatory Visit: Payer: Self-pay | Admitting: Family

## 2014-07-19 NOTE — Telephone Encounter (Signed)
Rx printed and forwarded to Provider for signature.  Medication name:  Name from pharmacy:  diazepam (VALIUM) 5 MG tablet  DIAZEPAM 5 MG TABLET Sig: TAKE 1 TABLET BY MOUTH AT BEDTIME Dispense: 30 tablet Refills: 0 Start: 07/18/2014 Class: Normal Notes to pharmacy: Not to exceed 6 additional fills before 12/17/2014. Requested on: 06/20/2014 Originally ordered on: 03/20/2011 Last refill: 06/20/2014

## 2014-07-21 NOTE — Telephone Encounter (Signed)
Rx was faxed to pharmacy on 07/19/14.

## 2014-08-21 ENCOUNTER — Telehealth: Payer: Self-pay | Admitting: *Deleted

## 2014-08-21 ENCOUNTER — Other Ambulatory Visit: Payer: Self-pay | Admitting: Family

## 2014-08-21 MED ORDER — DIAZEPAM 5 MG PO TABS: ORAL_TABLET | ORAL | Status: DC

## 2014-08-21 NOTE — Telephone Encounter (Signed)
Ok to send 30 tabs zero refills. 

## 2014-08-21 NOTE — Telephone Encounter (Signed)
rx refill- valium 5mg   Last OV- 07/04/14 Last refilled- 07/19/14 #30 / 0 rf  UDS- none

## 2014-08-21 NOTE — Telephone Encounter (Signed)
Rx called to pharmacy voicemail as below. 

## 2014-09-20 ENCOUNTER — Other Ambulatory Visit: Payer: Self-pay | Admitting: Family

## 2014-09-20 NOTE — Telephone Encounter (Signed)
Pt has f/u 10/11/14.  Last rx given 08/21/14.  Rx printed and forwarded to Provider for signature.

## 2014-09-20 NOTE — Telephone Encounter (Signed)
Rx faxed to pharmacy  

## 2014-10-02 ENCOUNTER — Other Ambulatory Visit: Payer: Self-pay | Admitting: Family

## 2014-10-02 ENCOUNTER — Other Ambulatory Visit: Payer: Self-pay | Admitting: Family Medicine

## 2014-10-02 NOTE — Telephone Encounter (Signed)
Medication Detail      Disp Refills Start End     escitalopram (LEXAPRO) 20 MG tablet 30 tablet 2 07/04/2014     Sig - Route: Take 1 tablet (20 mg total) by mouth daily. - Oral    E-Prescribing Status: Receipt confirmed by pharmacy (07/04/2014 9:22 AM EDT)    Patient Instructions  07/04/14   Continue lexapro 20mg  once daily. Please schedule a follow up appointment in 3 months- sooner if symptoms worsen.    Patient has Appointment scheduled for 12.23.15; Refill sent per [facility] refill protocol/SLS

## 2014-10-11 ENCOUNTER — Encounter: Payer: Self-pay | Admitting: Family

## 2014-10-11 ENCOUNTER — Ambulatory Visit (INDEPENDENT_AMBULATORY_CARE_PROVIDER_SITE_OTHER): Payer: Managed Care, Other (non HMO) | Admitting: Family

## 2014-10-11 VITALS — BP 118/78 | HR 68 | Temp 97.8°F | Resp 16 | Ht 71.0 in | Wt 175.6 lb

## 2014-10-11 DIAGNOSIS — F419 Anxiety disorder, unspecified: Secondary | ICD-10-CM

## 2014-10-11 DIAGNOSIS — L218 Other seborrheic dermatitis: Secondary | ICD-10-CM

## 2014-10-11 DIAGNOSIS — F418 Other specified anxiety disorders: Secondary | ICD-10-CM

## 2014-10-11 DIAGNOSIS — L219 Seborrheic dermatitis, unspecified: Secondary | ICD-10-CM

## 2014-10-11 DIAGNOSIS — G8929 Other chronic pain: Secondary | ICD-10-CM

## 2014-10-11 MED ORDER — CLOBETASOL PROPIONATE 0.05 % EX GEL: 1.0000 "application " | Freq: Two times a day (BID) | CUTANEOUS | Status: DC | PRN

## 2014-10-11 MED ORDER — DIAZEPAM 5 MG PO TABS: 5.0000 mg | ORAL_TABLET | Freq: Every day | ORAL | Status: DC

## 2014-10-11 MED ORDER — ESCITALOPRAM OXALATE 20 MG PO TABS: ORAL_TABLET | ORAL | Status: DC

## 2014-10-11 MED ORDER — HYDROCODONE-ACETAMINOPHEN 5-325 MG PO TABS: 1.0000 | ORAL_TABLET | ORAL | Status: DC | PRN

## 2014-10-11 NOTE — Assessment & Plan Note (Signed)
Continue prn vicodin

## 2014-10-11 NOTE — Assessment & Plan Note (Signed)
Trial of temovate gel

## 2014-10-11 NOTE — Progress Notes (Signed)
Pre visit review using our clinic review tool, if applicable. No additional management support is needed unless otherwise documented below in the visit note. 

## 2014-10-11 NOTE — Assessment & Plan Note (Signed)
Stable on lexapro and valium. Obtain uds today

## 2014-10-11 NOTE — Patient Instructions (Signed)
You can start temovate gel as needed for scalp itching. Please follow up in 6 months, sooner if problems/concerns.

## 2014-10-11 NOTE — Progress Notes (Signed)
Subjective:    Patient ID: Justin [name], male    DOB: 1956-08-29, 58 y.o.   MRN: [id number]  HPI   Justin [name] is a 58 yr old male who presents today for follow up.  Depression/anxiety- He continues lexapro and valium at bedtime.   Seborrheic dermatitis of the scap- continues to have itching despite use of selsun blue.  Chronic back pain- uses 1/2 tab once weekly.  He aloso has chronic left foot pain from prior injury   Review of Systems    see HPI  Past Medical History  Diagnosis Date  . Colon cancer 2003    specialist due for repeat colonoscopy in 2012  . Anxiety     takes approximately 10 diazepam/month-contracts to no more than that amount  . Depression   . Hyperlipidemia   . BPH (benign prostatic hypertrophy)     sees urology  . Overactive bladder   . Multiple fractures 08/2008    patient fell off a rood about 13 feet onto concrete-multiple fractures including vertrebral  . History of kidney stones   . Insomnia     History   Social History  . Marital Status: Married    Spouse Name: N/A    Number of Children: N/A  . Years of Education: N/A   Occupational History  . Not on file.   Social History Main Topics  . Smoking status: Former Research scientist (life sciences)  . Smokeless tobacco: Never Used     Comment: quit tobacco 2000 smoked for 20 years  . Alcohol Use: No     Comment: former drinker  . Drug Use: No  . Sexual Activity: Not on file   Other Topics Concern  . 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 Hip fracture surgery    . Elbow surgery    . Hernia repair    . Colectomy  02/2002    sigmoid colectomy    Family History  Problem Relation Age of Onset  .  Coronary artery disease Father   . Other Neg Hx     no FH od colon cancer    No Known Allergies  Current Outpatient Prescriptions on File Prior to Visit  Medication Sig Dispense Refill  . gabapentin (NEURONTIN) 300 MG capsule Take 1 capsule (300 mg total) by mouth 2 (two) times daily. 60 capsule 5  . tadalafil (CIALIS) 10 MG tablet 1/2 to 1 tab by mouth daily as needed prior to sexual activity. 10 tablet 5  . traZODone (DESYREL) 150 MG tablet TAKE 2 TABLETS (300MG ) BY MOUTH AT BEDTIME 60 tablet 0   No current facility-administered medications on file prior to visit.    BP 118/78 mmHg  Pulse 68  Temp(Src) 97.8 F (36.6 C) (Oral)  Resp 16  Ht 5\' 11"  (1.803 m)  Wt 175 lb 9.6 oz (79.652 kg)  BMI 24.50 kg/m2  SpO2 94%     Objective:   Physical Exam  Constitutional: He is oriented to person, place, and time. He appears well-developed and well-nourished. No distress.  HENT:  Head: Normocephalic and atraumatic.  Cardiovascular: Normal rate  and regular rhythm.   No murmur heard. Pulmonary/Chest: Effort normal and breath sounds normal. No respiratory distress. He has no wheezes. He has no rales. He exhibits no tenderness.  Neurological: He is alert and oriented to person, place, and time.  Psychiatric: He has a normal mood and affect. His behavior is normal. Judgment and thought content normal.          Assessment & Plan:

## 2014-11-14 ENCOUNTER — Encounter: Payer: Self-pay | Admitting: Family

## 2014-11-16 ENCOUNTER — Other Ambulatory Visit: Payer: Self-pay | Admitting: Family

## 2014-11-17 NOTE — Telephone Encounter (Signed)
Pt has f/u 04/09/15, Last rx 10/20/15, Rx printed and forwarded to [facility] for signature.

## 2014-11-17 NOTE — Telephone Encounter (Signed)
Ok to send 30 tabs zero refills. 

## 2014-11-17 NOTE — Telephone Encounter (Signed)
Rx called to pharmacy voicemail. 

## 2014-12-15 ENCOUNTER — Other Ambulatory Visit: Payer: Self-pay | Admitting: Family

## 2014-12-15 NOTE — Telephone Encounter (Signed)
Med called in

## 2014-12-15 NOTE — Telephone Encounter (Signed)
Ok to send 30 tabs zero refills. 

## 2014-12-18 ENCOUNTER — Telehealth: Payer: Self-pay | Admitting: Family

## 2014-12-18 MED ORDER — DIAZEPAM 5 MG PO TABS: 5.0000 mg | ORAL_TABLET | Freq: Every day | ORAL | Status: DC

## 2014-12-18 NOTE — Telephone Encounter (Signed)
Pt has f/u in 03/2015. Last rx printed 11/17/14.  Rx printed and forwarded to Provider for signature.

## 2014-12-18 NOTE — Telephone Encounter (Signed)
Needs diazepam faxed to [facility] [place]

## 2014-12-18 NOTE — Telephone Encounter (Signed)
Rx faxed to pharmacy, notified pt. 

## 2014-12-20 ENCOUNTER — Ambulatory Visit (INDEPENDENT_AMBULATORY_CARE_PROVIDER_SITE_OTHER): Payer: Managed Care, Other (non HMO) | Admitting: Medical

## 2014-12-20 ENCOUNTER — Encounter: Payer: Self-pay | Admitting: Medical

## 2014-12-20 VITALS — BP 130/79 | HR 67 | Temp 98.6°F | Ht 71.0 in | Wt 172.6 lb

## 2014-12-20 DIAGNOSIS — K4091 Unilateral inguinal hernia, without obstruction or gangrene, recurrent: Secondary | ICD-10-CM

## 2014-12-20 DIAGNOSIS — K4090 Unilateral inguinal hernia, without obstruction or gangrene, not specified as recurrent: Secondary | ICD-10-CM | POA: Insufficient documentation

## 2014-12-20 NOTE — Progress Notes (Signed)
Subjective:    Patient ID: Justin [name], male    DOB: 1956-03-11, 59 y.o.   MRN: [id number]  HPI  Pt in with some rt side groin area irritation. 6-9 month maybe mild irrtation. Would notice pain/irritation with lifting.  Pt has hx of inguinal hernias. He had at least 7 repairs. He had some repairs on both sides.   Pt used Dr. Ferdinand Lango. Previous Psychologist, sport and exercise are out of network.  Pain not severe pain, nausea, vomiting, or fever.      Review of Systems  Constitutional: Negative for fever, chills and fatigue.  Cardiovascular: Negative for chest pain and palpitations.  Gastrointestinal: Negative for nausea, vomiting, abdominal pain, diarrhea, constipation, blood in stool and rectal pain.       Only mild rt groin region pain.  Musculoskeletal: Negative for back pain.       No cva pain.  Neurological: Negative for dizziness, seizures, syncope, facial asymmetry, speech difficulty, weakness, light-headedness, numbness and headaches.  Hematological: Negative for adenopathy. Does not bruise/bleed easily.   Past Medical History  Diagnosis Date  . Colon cancer 2003    specialist due for repeat colonoscopy in 2012  . Anxiety     takes approximately 10 diazepam/month-contracts to no more than that amount  . Depression   . Hyperlipidemia   . BPH (benign prostatic hypertrophy)     sees urology  . Overactive bladder   . Multiple fractures 08/2008    patient fell off a rood about 13 feet onto concrete-multiple fractures including vertrebral  . History of kidney stones   . Insomnia     History   Social History  . Marital Status: Married    Spouse Name: N/A  . Number of Children: N/A  . Years of Education: N/A   Occupational History  . Not on file.   Social History Main Topics  . Smoking status: Former Research scientist (life sciences)  . Smokeless tobacco: Never Used     Comment: quit tobacco 2000 smoked for 20 years  . Alcohol Use: No     Comment: former drinker  . Drug Use: No  . Sexual Activity:  Not on file   Other Topics Concern  . 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 Hip fracture surgery    . Elbow surgery    . Hernia repair    . Colectomy  02/2002    sigmoid colectomy    Family History  Problem Relation Age of Onset  . Coronary artery disease Father   . Other Neg Hx     no FH od colon cancer    No Known Allergies  Current Outpatient Prescriptions on File Prior to Visit  Medication Sig Dispense Refill  . clobetasol (TEMOVATE) 0.05 % GEL Apply 1 application topically 2 (two) times daily as needed. 30 each 0  . diazepam (VALIUM) 5 MG tablet Take 1 tablet (5 mg total) by mouth at bedtime. 30 tablet 0  . escitalopram (LEXAPRO) 20 MG tablet TAKE 1 TABLET (20 MG TOTAL) BY MOUTH DAILY. 90 tablet 1  . gabapentin (NEURONTIN) 300 MG capsule Take 1 capsule (  300 mg total) by mouth 2 (two) times daily. 60 capsule 5  . HYDROcodone-acetaminophen (NORCO/VICODIN) 5-325 MG per tablet Take 1-2 tablets by mouth every 4 (four) hours as needed. 15 tablet 0  . tadalafil (CIALIS) 10 MG tablet 1/2 to 1 tab by mouth daily as needed prior to sexual activity. 10 tablet 5  . traZODone (DESYREL) 150 MG tablet TAKE 2 TABLETS (300MG ) BY MOUTH AT BEDTIME 60 tablet 0   No current facility-administered medications on file prior to visit.    BP 130/79 mmHg  Pulse 67  Temp(Src) 98.6 F (37 C) (Oral)  Ht 5\' 11"  (1.803 m)  Wt 172 lb 9.6 oz (78.291 kg)  BMI 24.08 kg/m2  SpO2 97%      Objective:   Physical Exam  General Appearance- Not in acute distress.  HEENT Eyes- Scleraeral/Conjuntiva-bilat- Not Yellow. Mouth & Throat- Normal.  Chest and Lung Exam Auscultation: Breath  sounds:-Normal. Adventitious sounds:- No Adventitious sounds.  Cardiovascular Auscultation:Rythm - Regular. Heart Sounds -Normal heart sounds.  Abdomen Inspection:-Inspection Normal.  Palpation/Perucssion: Palpation and Percussion of the abdomen reveal- Non Tender, No Rebound tenderness, No rigidity(Guarding) and No Palpable abdominal masses.  Liver:-Normal.  Spleen:- Normal.   Genital exm- normral testicles. Rt upper inguinal canal on coughing. I feel possible small hernia that reduces easily. Rt side- no obvios hernia felt in the canal.        Assessment & Plan:

## 2014-12-20 NOTE — Patient Instructions (Signed)
Inguinal hernia without obstruction or gangrene Probable early reoccurring inguinal hernia. No signs of complications. Will refer to general surgeon  If during interim any increasing severe pain, nausea, vomiting, fever then ED evaluation.     Follow up with surgeon but as needed with our office. Any emergent signs or symptoms then ED eval.

## 2014-12-20 NOTE — Progress Notes (Signed)
Pre visit review using our clinic review tool, if applicable. No additional management support is needed unless otherwise documented below in the visit note. 

## 2014-12-20 NOTE — Assessment & Plan Note (Deleted)
Probable early reoccurring inguinal hernia. No signs of complications. Will refer to general surgeon  If during interim any increasing severe pain, nausea, vomiting, fever then ED evaluation.

## 2014-12-20 NOTE — Assessment & Plan Note (Signed)
Probable early reoccurring inguinal hernia. No signs of complications. Will refer to general surgeon  If during interim any increasing severe pain, nausea, vomiting, fever then ED evaluation.

## 2015-01-29 ENCOUNTER — Other Ambulatory Visit: Payer: Self-pay | Admitting: Family

## 2015-02-12 ENCOUNTER — Other Ambulatory Visit: Payer: Self-pay | Admitting: Family

## 2015-02-13 NOTE — Telephone Encounter (Signed)
Pt has f/u on 04/09/15. UDS current but do not see CSC on file. Will have pt sign at f/u in June. Rx printed and forwarded to [facility] for signature.   Medication name:  Name from pharmacy:  diazepam (VALIUM) 5 MG tablet DIAZEPAM 5 MG TABLET     Sig: TAKE 1 TABLET BY MOUTH AT BEDTIME    Dispense: 30 tablet   Refills: 0   Start: 02/12/2015   Class: Normal    Notes to pharmacy: Not to exceed 5 additional fills before 06/16/2015.    Requested on: 12/18/2014    Originally ordered on: 03/20/2011 01/15/2015

## 2015-02-13 NOTE — Telephone Encounter (Signed)
Rx faxed to pharmacy  

## 2015-03-04 ENCOUNTER — Other Ambulatory Visit: Payer: Self-pay | Admitting: Family

## 2015-03-13 ENCOUNTER — Other Ambulatory Visit: Payer: Self-pay | Admitting: Family Medicine

## 2015-03-13 NOTE — Telephone Encounter (Signed)
Pt has f/u 04/09/15. Last Rx 02/13/15.  Rx printed and forwarded to Provider for signature.

## 2015-03-14 NOTE — Telephone Encounter (Signed)
Rx faxed to pharmacy  

## 2015-04-02 ENCOUNTER — Other Ambulatory Visit: Payer: Self-pay | Admitting: Family

## 2015-04-06 ENCOUNTER — Other Ambulatory Visit: Payer: Self-pay | Admitting: Family

## 2015-04-06 NOTE — Telephone Encounter (Signed)
Rx sent to the pharmacy by e-script.//AB/CMA 

## 2015-04-09 ENCOUNTER — Ambulatory Visit (INDEPENDENT_AMBULATORY_CARE_PROVIDER_SITE_OTHER): Payer: Managed Care, Other (non HMO) | Admitting: Family

## 2015-04-09 ENCOUNTER — Encounter: Payer: Self-pay | Admitting: Family

## 2015-04-09 VITALS — BP 128/82 | HR 62 | Temp 97.8°F | Resp 16 | Ht 71.0 in | Wt 174.0 lb

## 2015-04-09 DIAGNOSIS — G8929 Other chronic pain: Secondary | ICD-10-CM

## 2015-04-09 DIAGNOSIS — F418 Other specified anxiety disorders: Secondary | ICD-10-CM | POA: Diagnosis not present

## 2015-04-09 DIAGNOSIS — L219 Seborrheic dermatitis, unspecified: Secondary | ICD-10-CM

## 2015-04-09 DIAGNOSIS — K4091 Unilateral inguinal hernia, without obstruction or gangrene, recurrent: Secondary | ICD-10-CM

## 2015-04-09 DIAGNOSIS — Z0000 Encounter for general adult medical examination without abnormal findings: Secondary | ICD-10-CM

## 2015-04-09 DIAGNOSIS — L218 Other seborrheic dermatitis: Secondary | ICD-10-CM

## 2015-04-09 MED ORDER — TRAZODONE HCL 150 MG PO TABS: ORAL_TABLET | ORAL | Status: DC

## 2015-04-09 MED ORDER — GABAPENTIN 300 MG PO CAPS: ORAL_CAPSULE | ORAL | Status: DC

## 2015-04-09 MED ORDER — HYDROCODONE-ACETAMINOPHEN 5-325 MG PO TABS: 1.0000 | ORAL_TABLET | ORAL | Status: DC | PRN

## 2015-04-09 MED ORDER — DIAZEPAM 5 MG PO TABS: 5.0000 mg | ORAL_TABLET | Freq: Every day | ORAL | Status: DC

## 2015-04-09 NOTE — Progress Notes (Signed)
Subjective:    Patient ID: Justin [name], male    DOB: Apr 29, 1956, 59 y.o.   MRN: [id number]  HPI  Justin [name] is a 59 yr old male who presents today for follow up.  anxiety and depression- continues hx valium at bedtime.  Mood is "ok."  Denies panic attacks. Continues lexapro- pleased with therapy.    Chronic pain- back/ankle. Uses vicodin prn. Uses only once a week, sometimes only 1/2 tab at a time.    Seborrheic dermatitis- was given trial of temovate gel. He tried the gel and notes that this seemed to help. Not currently needing gel.  He did see a surgeon for right groin pain and no surgery was recommended (consult is reviewed). Pt reports that the groin pain has improved.    Review of Systems See HPI  Past Medical History  Diagnosis Date  . Colon cancer 2003    specialist due for repeat colonoscopy in 2012  . Anxiety     takes approximately 10 diazepam/month-contracts to no more than that amount  . Depression   . Hyperlipidemia   . BPH (benign prostatic hypertrophy)     sees urology  . Overactive bladder   . Multiple fractures 08/2008    patient fell off a rood about 13 feet onto concrete-multiple fractures including vertrebral  . History of kidney stones   . Insomnia     History   Social History  . Marital Status: Married    Spouse Name: N/A  . Number of Children: N/A  . Years of Education: N/A   Occupational History  . Not on file.   Social History Main Topics  . Smoking status: Former Research scientist (life sciences)  . Smokeless tobacco: Never Used     Comment: quit tobacco 2000 smoked for 20 years  . Alcohol Use: No     Comment: former drinker  . Drug Use: No  . Sexual Activity: Not on file   Other Topics Concern  . 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 Hip fracture surgery    . Elbow surgery    . Hernia repair    . Colectomy  02/2002    sigmoid colectomy    Family History  Problem Relation Age of Onset  . Coronary artery disease Father   . Other Neg Hx     no FH od colon cancer    No Known Allergies  Current Outpatient Prescriptions on File Prior to Visit  Medication Sig Dispense Refill  . diazepam (VALIUM) 5 MG tablet TAKE 1 TABLET BY MOUTH AT BEDTIME 30 tablet 0  . escitalopram (LEXAPRO) 20 MG tablet TAKE 1 TABLET (20 MG TOTAL) BY MOUTH DAILY. 90 tablet 1  . gabapentin (NEURONTIN) 300 MG capsule TAKE 1 CAPSULE (300 MG TOTAL) BY MOUTH 2 (TWO) TIMES DAILY. 60 capsule 0  . HYDROcodone-acetaminophen (NORCO/VICODIN) 5-325 MG per tablet Take 1-2 tablets by mouth every 4 (four) hours as needed. 15 tablet 0  . traZODone (DESYREL) 150 MG tablet TAKE 2 TABLETS (300MG ) BY MOUTH AT BEDTIME (Patient taking differently: TAKE 1 1/2 TABLETS (300MG ) BY  MOUTH AT BEDTIME) 60 tablet 2   No current facility-administered medications on file prior to visit.    BP 128/82 mmHg  Pulse 62  Temp(Src) 97.8 F (36.6 C) (Oral)  Resp 16  Ht 5\' 11"  (1.803 m)  Wt 174 lb (78.926 kg)  BMI 24.28 kg/m2  SpO2 94%        Objective:   Physical Exam  Constitutional: He is oriented to person, place, and time. He appears well-developed and well-nourished. No distress.  HENT:  Head: Normocephalic and atraumatic.  Cardiovascular: Normal rate and regular rhythm.   No murmur heard. Pulmonary/Chest: Effort normal and breath sounds normal. No respiratory distress. He has no wheezes. He has no rales. He exhibits no tenderness.  Musculoskeletal: He exhibits no edema.  Lymphadenopathy:    He has no cervical adenopathy.  Neurological: He is alert and oriented to person, place, and time.  Skin: Skin is warm and dry.  Psychiatric: He has a normal mood and affect. His  behavior is normal. Judgment and thought content normal.          Assessment & Plan:

## 2015-04-09 NOTE — Assessment & Plan Note (Signed)
Improved, continue same.

## 2015-04-09 NOTE — Assessment & Plan Note (Signed)
Stable, continue current meds.  A controlled substance contract is signed today and we will obtain UDS.

## 2015-04-09 NOTE — Assessment & Plan Note (Signed)
Not a definite hernia per surgeon, they did not not recommend surgery. Symptoms improved.

## 2015-04-09 NOTE — Patient Instructions (Signed)
Follow up for complete physical at your convenience.

## 2015-04-09 NOTE — Progress Notes (Signed)
Pre visit review using our clinic review tool, if applicable. No additional management support is needed unless otherwise documented below in the visit note. 

## 2015-04-09 NOTE — Assessment & Plan Note (Signed)
Continues rare use of prn hydrocodone.

## 2015-04-10 ENCOUNTER — Encounter: Payer: Self-pay | Admitting: Family

## 2015-04-10 LAB — HIV ANTIBODY (ROUTINE TESTING W REFLEX): HIV: NONREACTIVE

## 2015-04-30 ENCOUNTER — Other Ambulatory Visit: Payer: Self-pay | Admitting: Family

## 2015-05-10 ENCOUNTER — Other Ambulatory Visit: Payer: Self-pay | Admitting: Family Medicine

## 2015-05-10 NOTE — Telephone Encounter (Signed)
Requesting Diazepam 5mg -Take 1 tablet by mouth at bedtime. Last refill:04/09/15;#30,0 Last OV:04/09/15 UDS:04/09/15-Moderate risk Please advise.//AB/CMA

## 2015-05-11 NOTE — Telephone Encounter (Signed)
Rx called to pharmacy voicemail. 

## 2015-05-11 NOTE — Telephone Encounter (Signed)
OK to send 30 tabs zero refills.  

## 2015-06-08 ENCOUNTER — Other Ambulatory Visit: Payer: Self-pay | Admitting: Family

## 2015-06-08 NOTE — Telephone Encounter (Signed)
Rx faxed to pharmacy  

## 2015-06-08 NOTE — Telephone Encounter (Signed)
Last Rx printed 05/11/15. CSC on file and UDS current 03/2015 and due again 07/10/15.  Rx printed and forwarded to PCP for signature.

## 2015-06-09 ENCOUNTER — Other Ambulatory Visit: Payer: Self-pay | Admitting: Family

## 2015-06-12 ENCOUNTER — Other Ambulatory Visit: Payer: Self-pay | Admitting: Family

## 2015-06-12 MED ORDER — TADALAFIL 10 MG PO TABS: ORAL_TABLET | ORAL | Status: DC

## 2015-06-12 NOTE — Telephone Encounter (Signed)
Refill sent.

## 2015-06-12 NOTE — Telephone Encounter (Signed)
Ok to send in refill then!

## 2015-06-12 NOTE — Telephone Encounter (Signed)
Medication not on patient's current medication list. Was discontinued on 04/09/15 -- patient preference. Can we talk to patient to see if he was having issue with medication previously? Just wanting to look into this a bit before refilling med as it was not on med list.

## 2015-06-12 NOTE — Addendum Note (Signed)
Addended by: Kelle Darting A on: 06/12/2015 05:11 PM   Modules accepted: Orders

## 2015-06-12 NOTE — Telephone Encounter (Signed)
Justin [name]-- spoke with pt re: Cialis. He states he stopped it due to cost. Has coupon from [facility] that he is hoping will offset cost and is requesting another Rx.  Please advise.

## 2015-06-18 ENCOUNTER — Telehealth: Payer: Self-pay | Admitting: *Deleted

## 2015-06-18 MED ORDER — HYDROCODONE-ACETAMINOPHEN 5-325 MG PO TABS: 1.0000 | ORAL_TABLET | ORAL | Status: DC | PRN

## 2015-06-18 NOTE — Telephone Encounter (Signed)
Rx signed [facility]ed to [facility]. Notified pt. He states that he used the coupon for Cialis and cost is still too high.

## 2015-06-18 NOTE — Telephone Encounter (Signed)
Rx placed at front desk for pick up and pt has been notified. 

## 2015-06-18 NOTE — Telephone Encounter (Signed)
Pt called requesting refill of Hydrocodone.  Last UDS and [place] 04/09/15, next UDS 06/2015.  Last rx 04/09/15, #15.  Rx printed and forwarded to PCP for signature.

## 2015-06-18 NOTE — Telephone Encounter (Signed)
Roselind Rily with [facility] called. She received RX for hydrocodone and called to remind [place] pt has to have a print out and hand carry to pharmacy.

## 2015-07-06 ENCOUNTER — Other Ambulatory Visit: Payer: Self-pay | Admitting: Family

## 2015-07-06 NOTE — Telephone Encounter (Signed)
Rx faxed to pharmacy  

## 2015-07-14 ENCOUNTER — Other Ambulatory Visit: Payer: Self-pay | Admitting: Family

## 2015-07-16 NOTE — Telephone Encounter (Signed)
Denial sent to pharmacy for below trazodone Rx as refill was sent on 04/09/15, #135 x 1 refill with different directions ( 1 1/2 tablet at bedtime).

## 2015-07-16 NOTE — Telephone Encounter (Deleted)
Please advise refill:  Name from pharmacy:  In chart as:  TRAZODONE 150 MG TABLET traZODone (DESYREL) 150 MG tablet     The source prescription has been discontinued.    Sig: TAKE 2 TABLETS (300MG ) BY MOUTH AT BEDTIME    Dispense: 60 tablet   Refills: 2   Start: 07/14/2015   Class: Normal    Requested on: 01/29/2015    Originally ordered on: 04/12/2014 04/03/2015

## 2015-07-31 ENCOUNTER — Other Ambulatory Visit: Payer: Self-pay | Admitting: *Deleted

## 2015-07-31 NOTE — Telephone Encounter (Signed)
[facility] for Trazodone 150mg  (take 2 tablets at bedtime).  Last Rx sent by PCP was on 04/09/15 to take 1 1/2 tablets by mouth at bedtime.  Left message for pt to return my call and verify how he is taking medication.

## 2015-08-01 MED ORDER — TRAZODONE HCL 150 MG PO TABS: ORAL_TABLET | ORAL | Status: DC

## 2015-08-01 NOTE — Telephone Encounter (Signed)
Spoke with pt and confirmed that he is taking 1 1/2 tablets at bedtime. Rx sent with current directions.

## 2015-08-01 NOTE — Telephone Encounter (Signed)
Attempted to reach the patient regarding the below. Left message for patient to return call when available.

## 2015-08-06 ENCOUNTER — Other Ambulatory Visit: Payer: Self-pay | Admitting: Family

## 2015-08-06 NOTE — Telephone Encounter (Signed)
Last diazepam Rx 07/06/15, #30.  Pt is due for UDS and will need to complete when he picks up Rx.  Pt also due for fasting cpe. Rx printed and forwarded to PCP for signature.

## 2015-08-07 NOTE — Telephone Encounter (Signed)
Pt returned my call and voices understanding.

## 2015-08-07 NOTE — Telephone Encounter (Signed)
Left message for pt to return my call.  Rx has been placed at front desk for pick up and pt will need to provide UDS when he picks up Rx.

## 2015-09-03 ENCOUNTER — Other Ambulatory Visit: Payer: Self-pay | Admitting: Family Medicine

## 2015-09-03 NOTE — Telephone Encounter (Signed)
Last Diazepam Rx printed 08/06/15, #30.  Next UDS due 10/2015.  Rx printed and forwarded to PCP for signature.

## 2015-09-04 NOTE — Telephone Encounter (Signed)
Rx faxed to pharmacy  

## 2015-09-19 ENCOUNTER — Telehealth: Payer: Self-pay | Admitting: Family

## 2015-09-19 NOTE — Telephone Encounter (Signed)
Pt called in stating that DOS 08/06/15 he came in to do labs and was charged for labs that his insurance will not cover. He says that on his insurance statement it's showing that it was labs for something that wasn't medically necessary. Pt would like assistance with this. He says that he spoke with our billing office and was referred to the office.     CB: [phone]

## 2015-09-19 NOTE — Telephone Encounter (Signed)
Spoke with pt. 08/06/15 DOS is for toxicology. Gave pt # for [facility] to discuss balance.

## 2015-10-02 ENCOUNTER — Other Ambulatory Visit: Payer: Self-pay | Admitting: Family Medicine

## 2015-10-03 NOTE — Telephone Encounter (Signed)
Rx faxed to pharmacy at 3:25pm.  Next UDS due: 11/06/15. Pt last seen 03/2015 and advised CPE. Pt needs appt soon. Last Rx printed 09/04/15. Rx printed and forwarded to PCP for signature.  Name from pharmacy:  In chart as:  DIAZEPAM 5 MG TABLET diazepam (VALIUM) 5 MG tablet     Sig: TAKE ONE TABLET BY MOUTH AT BEDTIME    Dispense: 30 tablet   Refills: 0   Start: 10/02/2015   Class: Normal    Notes to pharmacy: Not to exceed 5 additional fills before 03/02/2016.    Requested on: 09/04/2015    Originally ordered on: 03/20/2011 09/04/2015

## 2015-10-30 ENCOUNTER — Other Ambulatory Visit: Payer: Self-pay | Admitting: Family Medicine

## 2015-10-30 NOTE — Telephone Encounter (Signed)
Rx placed at front desk for pick up. Notified pt that he will need to provide UDS at time that rx is picked up.  Scheduled pt for CPE on 11/27/14 at 8:15am.

## 2015-10-30 NOTE — Telephone Encounter (Signed)
Last Rx 10/03/15.  Pt last seen 04/09/15 and advised fasting CPE.  Pt needs appt before further refills due. Next UDS due 11/06/15.  Rx printed and forwarded to PCP for signature. Will have pt pick up Rx and provide UDS.   Name from pharmacy:  In chart as:  DIAZEPAM 5 MG TABLET diazepam (VALIUM) 5 MG tablet    Sig: TAKE 1 TABLET BY MOUTH AT BEDTIME    Dispense: 30 tablet   Refills: 0   Start: 10/30/2015   Class: Normal    Notes to pharmacy: Not to exceed 5 additional fills before 03/31/2016.    Requested on: 10/03/2015    Originally ordered on: 03/20/2011 10/04/2015

## 2015-10-31 ENCOUNTER — Telehealth: Payer: Self-pay | Admitting: Family

## 2015-10-31 MED ORDER — HYDROCODONE-ACETAMINOPHEN 5-325 MG PO TABS: 1.0000 | ORAL_TABLET | ORAL | Status: DC | PRN

## 2015-10-31 NOTE — Telephone Encounter (Signed)
Rx last printed 06/18/15.  Rx printed, signed and given to pt.

## 2015-10-31 NOTE — Telephone Encounter (Signed)
Relation to [id number] Call back number:[phone] Pharmacy:  Reason for call:  Patient requesting a refill HYDROcodone-acetaminophen (NORCO/VICODIN) 5-325 MG per tablet, patient states he will be in the area in 10 minutes advised patient PCP is between patients and will contact when Rx is ready for pick up

## 2015-11-08 ENCOUNTER — Other Ambulatory Visit: Payer: Self-pay | Admitting: Family

## 2015-11-09 NOTE — Telephone Encounter (Signed)
Pt requesting a refill on trazadone last office visit was 6 20 16.

## 2015-11-25 ENCOUNTER — Telehealth: Payer: Self-pay | Admitting: Family

## 2015-11-26 NOTE — Telephone Encounter (Signed)
Gabapentin refill sent to pharmacy. Pt past due for follow up and needs fasting physical.  Please call pt to arrange physical soon.  Thanks!

## 2015-11-28 ENCOUNTER — Encounter: Payer: Self-pay | Admitting: Family

## 2015-11-28 NOTE — Telephone Encounter (Signed)
Pt has been advised.

## 2015-11-30 ENCOUNTER — Encounter: Payer: Self-pay | Admitting: Family

## 2015-12-03 ENCOUNTER — Other Ambulatory Visit: Payer: Self-pay | Admitting: Family

## 2015-12-03 NOTE — Telephone Encounter (Signed)
Last Diazepam Rx 10/30/15, next UDS due 01/29/16 and next f/u 12/2015.  Rx printed and forwarded to PCP for signature.

## 2015-12-05 NOTE — Telephone Encounter (Signed)
Rx faxed to pharmacy on 12/04/15.

## 2015-12-07 ENCOUNTER — Telehealth: Payer: Self-pay | Admitting: Family

## 2015-12-07 ENCOUNTER — Ambulatory Visit (INDEPENDENT_AMBULATORY_CARE_PROVIDER_SITE_OTHER): Payer: Managed Care, Other (non HMO) | Admitting: Medical

## 2015-12-07 ENCOUNTER — Encounter: Payer: Self-pay | Admitting: Medical

## 2015-12-07 VITALS — BP 140/85 | HR 66 | Temp 98.1°F | Ht 71.0 in | Wt 170.6 lb

## 2015-12-07 DIAGNOSIS — R1032 Left lower quadrant pain: Secondary | ICD-10-CM | POA: Diagnosis not present

## 2015-12-07 DIAGNOSIS — M545 Low back pain, unspecified: Secondary | ICD-10-CM

## 2015-12-07 LAB — CBC WITH DIFFERENTIAL/PLATELET
Basophils Absolute: 0 10*3/uL (ref 0.0–0.1)
Basophils Relative: 0 % (ref 0–1)
EOS ABS: 0.1 10*3/uL (ref 0.0–0.7)
EOS PCT: 2 % (ref 0–5)
HCT: 43.5 % (ref 39.0–52.0)
Hemoglobin: 15.1 g/dL (ref 13.0–17.0)
LYMPHS ABS: 1.2 10*3/uL (ref 0.7–4.0)
Lymphocytes Relative: 18 % (ref 12–46)
MCH: 32.5 pg (ref 26.0–34.0)
MCHC: 34.7 g/dL (ref 30.0–36.0)
MCV: 93.5 fL (ref 78.0–100.0)
MONO ABS: 0.6 10*3/uL (ref 0.1–1.0)
MONOS PCT: 9 % (ref 3–12)
MPV: 9.5 fL (ref 8.6–12.4)
NEUTROS PCT: 71 % (ref 43–77)
Neutro Abs: 4.6 10*3/uL (ref 1.7–7.7)
PLATELETS: 193 10*3/uL (ref 150–400)
RBC: 4.65 MIL/uL (ref 4.22–5.81)
RDW: 12.8 % (ref 11.5–15.5)
WBC: 6.5 10*3/uL (ref 4.0–10.5)

## 2015-12-07 LAB — COMPREHENSIVE METABOLIC PANEL
ALT: 13 U/L (ref 9–46)
AST: 20 U/L (ref 10–35)
Albumin: 4.1 g/dL (ref 3.6–5.1)
Alkaline Phosphatase: 68 U/L (ref 40–115)
BILIRUBIN TOTAL: 1 mg/dL (ref 0.2–1.2)
BUN: 14 mg/dL (ref 7–25)
CO2: 28 mmol/L (ref 20–31)
CREATININE: 0.92 mg/dL (ref 0.70–1.25)
Calcium: 9 mg/dL (ref 8.6–10.3)
Chloride: 105 mmol/L (ref 98–110)
GLUCOSE: 80 mg/dL (ref 65–99)
Potassium: 4 mmol/L (ref 3.5–5.3)
SODIUM: 140 mmol/L (ref 135–146)
Total Protein: 6.2 g/dL (ref 6.1–8.1)

## 2015-12-07 LAB — POCT URINALYSIS DIPSTICK
Bilirubin, UA: NEGATIVE
Glucose, UA: NEGATIVE
LEUKOCYTES UA: NEGATIVE
NITRITE UA: NEGATIVE
PH UA: 6
PROTEIN UA: NEGATIVE
RBC UA: NEGATIVE
Spec Grav, UA: >=1.03
UROBILINOGEN UA: NEGATIVE

## 2015-12-07 MED ORDER — CIPROFLOXACIN HCL 500 MG PO TABS: 500.0000 mg | ORAL_TABLET | Freq: Two times a day (BID) | ORAL | Status: DC

## 2015-12-07 MED ORDER — METRONIDAZOLE 500 MG PO TABS: 500.0000 mg | ORAL_TABLET | Freq: Three times a day (TID) | ORAL | Status: DC

## 2015-12-07 NOTE — Patient Instructions (Addendum)
For your recent moderate abdomen pain will treat you for diverticulitis. Will get cbc and cmp. Will rx cipro and flagyl to start today.(start probiotics while on antibiotic)  You have some underlying IBS history and you may benefit from medicine such as levbid which can slow down activity of muscle in your intestine and reduce number of stools. May write on follow up depending on how you are.  With your history of colon cancer I want to make sure you are feeling better. May get ct abdomen/pelvis next week if not improving.  Over next weekend if you were to resume pain med for you chronic back pain this may temporarily slow down bm's.  If any severe increase in pain over weekend then ED evaluation.  Follow up with me Tuesday morning.

## 2015-12-07 NOTE — Telephone Encounter (Signed)
Melissa-- see [facility]'s phone note. Looks like they scheduled him at [facility] in the morning. Please advise?

## 2015-12-07 NOTE — Telephone Encounter (Signed)
I think patient needs to be seen today, if no openings needs to go to urgent care

## 2015-12-07 NOTE — Telephone Encounter (Signed)
Pt will see PA, Saguier today at 3:30 and appt scheduled. Appt for 12/08/15 cancelled per PCP.

## 2015-12-07 NOTE — Progress Notes (Signed)
Subjective:    Patient ID: Justin [name], male    DOB: 21-Nov-1955, 60 y.o.   MRN: [id number]  HPI  Pt in with some abdomen pain recently worse over past 10 days but baseline hx of chronic mild abdomen pain with IBS hx.  By history  loose stool predominant IBS(example had 8 soft stool  today but not watery/diarrhea). Pt has history of back pain in the past as well. But pain in back recently worse. He has not been using his hydrocodone.   Abdomen recently mid suprapubic and left lower quadrant. Some recent lt cva area pain as well.   Pt has seen GI doctor in the past. Pt had coloscopy. Pt had negative colonsocpy in 2015. Told to repeat in 2019. He has history of colon cancer. When he got his colon resected pt was told a lot of diverticulosis per surgeon. Surgeon told him he would likely have diverticultis.   Review of Systems  Constitutional: Negative for fever, chills and fatigue.  Respiratory: Negative for cough, shortness of breath and wheezing.   Cardiovascular: Negative for chest pain and palpitations.  Gastrointestinal: Positive for abdominal pain. Negative for nausea, vomiting and blood in stool.       See hpi  Genitourinary: Negative for dysuria, urgency, frequency and testicular pain.  Musculoskeletal: Positive for back pain.  Neurological: Negative for dizziness, light-headedness and headaches.  Hematological: Negative for adenopathy. Does not bruise/bleed easily.  Psychiatric/Behavioral: Negative for behavioral problems and confusion.   Past Medical History  Diagnosis Date  . Colon cancer [facility]) 2003    specialist due for repeat colonoscopy in 2012  . Anxiety     takes approximately 10 diazepam/month-contracts to no more than that amount  . Depression   . Hyperlipidemia   . BPH (benign prostatic hypertrophy)     sees urology  . Overactive bladder   . Multiple fractures 08/2008    patient fell off a rood about 13 feet onto concrete-multiple fractures including  vertrebral  . History of kidney stones   . Insomnia     Social History   Social History  . Marital Status: Married    Spouse Name: N/A  . Number of Children: N/A  . Years of Education: N/A   Occupational History  . Not on file.   Social History Main Topics  . Smoking status: Former Research scientist (life sciences)  . Smokeless tobacco: Never Used     Comment: quit tobacco 2000 smoked for 20 years  . Alcohol Use: No     Comment: former drinker  . Drug Use: No  . Sexual Activity: Not on file   Other Topics Concern  . 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 Hip fracture surgery    . Elbow surgery    . Hernia repair    . Colectomy  02/2002    sigmoid colectomy    Family History  Problem Relation Age of Onset  . Coronary artery disease Father   . Other Neg Hx     no FH od colon cancer    No Known Allergies  Current Outpatient  Prescriptions on File Prior to Visit  Medication Sig Dispense Refill  . diazepam (VALIUM) 5 MG tablet TAKE 1 TABLET BY MOUTH AT BEDTIME 30 tablet 0  . escitalopram (LEXAPRO) 20 MG tablet TAKE 1 TABLET (20 MG TOTAL) BY MOUTH DAILY. 90 tablet 1  . gabapentin (NEURONTIN) 300 MG capsule TAKE 1 CAPSULE (300 MG TOTAL) BY MOUTH 2 (TWO) TIMES DAILY. 60 capsule 1  . HYDROcodone-acetaminophen (NORCO/VICODIN) 5-325 MG tablet Take 1-2 tablets by mouth every 4 (four) hours as needed. 15 tablet 0  . tadalafil (CIALIS) 10 MG tablet 1/2 to 1 tab by mouth daily as needed prior to sexual activity. 10 tablet 5  . traZODone (DESYREL) 150 MG tablet TAKE 1 & 1/2 TABLETS BY MOUTH AT BEDTIME 135 tablet 1   No current facility-administered medications on file prior to visit.    BP 140/85  mmHg  Pulse 66  Temp(Src) 98.1 F (36.7 C) (Oral)  Ht 5\' 11"  (1.803 m)  Wt 170 lb 9.6 oz (77.384 kg)  BMI 23.80 kg/m2  SpO2 96%       Objective:   Physical Exam  General Appearance- Not in acute distress.  HEENT Eyes- Scleraeral/Conjuntiva-bilat- Not Yellow. Mouth & Throat- Normal.  Chest and Lung Exam Auscultation: Breath sounds:-Normal. Adventitious sounds:- No Adventitious sounds.  Cardiovascular Auscultation:Rythm - Regular. Heart Sounds -Normal heart sounds.  Abdomen Inspection:-Inspection Normal.  Palpation/Perucssion: Palpation and Percussion of the abdomen reveal- Non Tender currently, No Rebound tenderness, No rigidity(Guarding) and No Palpable abdominal masses.  Liver:-Normal.  Spleen:- Normal.   Rectal Anorectal Exam: Stool - Hemoccult of stool/mucous is Heme Negative. External - normal external exam. Internal - normal sphincter tone. No rectal mass.  Back-no cva tenderness.      Assessment & Plan:  [phone].  For your recent moderate abdomen pain will treat you for diverticulitis. Will get cbc and cmp. Will rx cipro and flagyl to start today.(start probiotics while on antibiotic)  You have some underlying IBS history and you may benefit from medicine such as levbid which can slow down activity of muscle in your intestine and reduce number of stools. May write on follow up depending on how you are.  With your history of colon cancer I want to make sure you are feeling better. May get ct abdomen/pelvis next week if not improving.  Over next weekend if you were to resume pain med for you chronic back pain this may temporarily slow down bm's.  If any severe increase in pain over weekend then ED evaluation.  Follow up with me Tuesday morning.

## 2015-12-07 NOTE — Telephone Encounter (Signed)
° °  Reason for call: Patient states that he has Severe Diverticulitis ad is having severe abdominal pain and back pain. Transferred to [facility]. Spoke with Larene Beach

## 2015-12-07 NOTE — Progress Notes (Signed)
Pre visit review using our clinic review tool, if applicable. No additional management support is needed unless otherwise documented below in the visit note. 

## 2015-12-07 NOTE — Telephone Encounter (Signed)
[facility] - Client TELEPHONE ADVICE RECORD [facility] Patient Name: Justin [name] DOB: 1956-04-22 Initial Comment caller states he has severe abd and back pain Nurse Assessment Nurse: Mechele Dawley, RN, Amy Date/Time Eilene Ghazi Time): 12/07/2015 1:01:19 PM Confirm and document reason for call. If symptomatic, describe symptoms. You must click the next button to save text entered. ---ABDOMINAL AND BACK PAIN. HE IS HAVING PROBLEMS WITH CONTROLLING BMS. HE STATES THAT HE IS HAVING 8 BMS. HE STATES THE STOOLS ARE NORMAL. HE DOES HAVE IBS AND DIVERTICULITIS. HE STATES LAST WEEK AND PART OF THIS WEEK THIS HAS BEEN SEVERE. HE HAD TO GOT BACK HOME THIS MORNING. HE STATES THIS IS UNCONTROLLABLE AT TIMES. HE STATES THAT WHEN HE PASSES GAS IT IS A TERRIBLE ODOR. ABDOMINAL PAIN IS CONSTANT AND THE LOWER BACK PAIN IS WRAPPING AROUND THE ABD AREA. HE STATES THAT HE THOUGHT HE WAS JUST SORE. HE DOES NOT KNOW WHAT IS GOING ON. NO VOMITING. NO FEVER. ABD PAIN IS PAIN IS 4-5/10. BACK PAIN 4-5/10 Has the patient traveled out of the country within the last 30 days? ---Not Applicable Does the patient have any new or worsening symptoms? ---Yes Will a triage be completed? ---Yes Related visit to physician within the last 2 weeks? ---No Does the PT have any chronic conditions? (i.e. diabetes, asthma, etc.) ---Yes List chronic conditions. ---PLEASE SEE EPIC Is this a behavioral health or substance abuse call? ---No Guidelines Guideline Title Affirmed Question Affirmed Notes Abdominal Pain - Male [1] MILD-MODERATE pain AND [2] constant AND [3] present > 2 hours Final Disposition User See Physician within 4 Hours (or PCP triage) Mechele Dawley, RN, Amy Referrals REFERRED TO PCP OFFICE Disagree/Comply: Comply

## 2015-12-08 ENCOUNTER — Ambulatory Visit: Payer: Self-pay | Admitting: Family Medicine

## 2015-12-11 ENCOUNTER — Ambulatory Visit (INDEPENDENT_AMBULATORY_CARE_PROVIDER_SITE_OTHER): Payer: Managed Care, Other (non HMO) | Admitting: Medical

## 2015-12-11 ENCOUNTER — Encounter: Payer: Self-pay | Admitting: Medical

## 2015-12-11 VITALS — BP 135/85 | HR 61 | Temp 98.2°F | Wt 172.0 lb

## 2015-12-11 DIAGNOSIS — Z8719 Personal history of other diseases of the digestive system: Secondary | ICD-10-CM

## 2015-12-11 DIAGNOSIS — R1032 Left lower quadrant pain: Secondary | ICD-10-CM

## 2015-12-11 MED ORDER — HYDROXYZINE HCL 25 MG PO TABS: ORAL_TABLET | ORAL | Status: DC

## 2015-12-11 NOTE — Patient Instructions (Addendum)
For you current symptoms. I would advise continuing cipro and flagyl.   We got you in with GI specialist tomorrow. [name] PA-C [facility] at 9 am)  Follow up with pcp as regularly scheduled or as needed with her or myself.  End mentioned insomnia. Add hydroxyzine to take at night.

## 2015-12-11 NOTE — Progress Notes (Signed)
Subjective:    Patient ID: Justin [name], male    DOB: 16-Nov-1955, 60 y.o.   MRN: [id number]  HPI   Pt in with some abdomen pain recently worse over past 10 days but baseline hx of chronic mild abdomen pain with IBS hx.  By history loose stool predominant IBS(example had 8 soft stool today but not watery/diarrhea). Pt has history of back pain in the past as well. But pain in back recently worse. He has not been using his hydrocodone.  Abdomen recently mid suprapubic and left lower quadrant. Some recent lt cva area pain as well.   Pt has seen GI doctor in the past. Pt had coloscopy. Pt had negative colonsocpy in 2015. Told to repeat in 2019. He has history of colon cancer(Rectosigmoid colon area). When he got his colon resected pt was told a lot of diverticulosis per surgeon. Surgeon told him he would likely have diverticultis.  Above is hop from last note.  He is here for follow up. I gave flagyl and cipro over weekend to cover diverticulitis. Wanted to follow up today to see how he is doing. Pt states he is same/ not better. Some loose stools last night. Some recurrent back pain. But he has history of this back pain as described on last exam. His urine on last visit did not show any blood.  Pt has history of soiling his pants.(over past 1.5 years or so)  Pt told MA 12 loose stools last night. But over weekend did not report loose stools like this.     Review of Systems  Constitutional: Negative for fever, chills and fatigue.  Respiratory: Negative for cough, shortness of breath and wheezing.   Cardiovascular: Negative for chest pain and palpitations.  Gastrointestinal: Positive for abdominal pain and diarrhea. Negative for nausea, vomiting, constipation, blood in stool, abdominal distention, anal bleeding and rectal pain.       States he is about exact same as before.  Musculoskeletal: Negative for back pain.  Neurological: Negative for dizziness, weakness and headaches.    Hematological: Negative for adenopathy. Does not bruise/bleed easily.  Psychiatric/Behavioral: Negative for behavioral problems and confusion.    Past Medical History  Diagnosis Date  . Colon cancer [facility]) 2003    specialist due for repeat colonoscopy in 2012  . Anxiety     takes approximately 10 diazepam/month-contracts to no more than that amount  . Depression   . Hyperlipidemia   . BPH (benign prostatic hypertrophy)     sees urology  . Overactive bladder   . Multiple fractures 08/2008    patient fell off a rood about 13 feet onto concrete-multiple fractures including vertrebral  . History of kidney stones   . Insomnia     Social History   Social History  . Marital Status: Married    Spouse Name: N/A  . Number of Children: N/A  . Years of Education: N/A   Occupational History  . Not on file.   Social History Main Topics  . Smoking status: Former Research scientist (life sciences)  . Smokeless tobacco: Never Used     Comment: quit tobacco 2000 smoked for 20 years  . Alcohol Use: No     Comment: former drinker  . Drug Use: No  . Sexual Activity: Not on file   Other Topics Concern  . 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 Hip fracture surgery    . Elbow surgery    . Hernia repair    . Colectomy  02/2002    sigmoid colectomy    Family History  Problem Relation Age of Onset  . Coronary artery disease Father   . Other Neg Hx     no FH od colon cancer    No Known Allergies  Current Outpatient Prescriptions on File Prior to Visit  Medication Sig Dispense Refill  . ciprofloxacin (CIPRO) 500 MG tablet Take 1 tablet (500 mg total) by mouth 2 (two) times daily. 14 tablet 0  . diazepam  (VALIUM) 5 MG tablet TAKE 1 TABLET BY MOUTH AT BEDTIME 30 tablet 0  . escitalopram (LEXAPRO) 20 MG tablet TAKE 1 TABLET (20 MG TOTAL) BY MOUTH DAILY. 90 tablet 1  . gabapentin (NEURONTIN) 300 MG capsule TAKE 1 CAPSULE (300 MG TOTAL) BY MOUTH 2 (TWO) TIMES DAILY. 60 capsule 1  . HYDROcodone-acetaminophen (NORCO/VICODIN) 5-325 MG tablet Take 1-2 tablets by mouth every 4 (four) hours as needed. 15 tablet 0  . metroNIDAZOLE (FLAGYL) 500 MG tablet Take 1 tablet (500 mg total) by mouth 3 (three) times daily. 21 tablet 0  . traZODone (DESYREL) 150 MG tablet TAKE 1 & 1/2 TABLETS BY MOUTH AT BEDTIME 135 tablet 1  . tadalafil (CIALIS) 10 MG tablet 1/2 to 1 tab by mouth daily as needed prior to sexual activity. (Patient not taking: Reported on 12/11/2015) 10 tablet 5   No current facility-administered medications on file prior to visit.    BP 135/85 mmHg  Pulse 61  Temp(Src) 98.2 F (36.8 C)  Wt 172 lb (78.019 kg)  SpO2 99%       Objective:   Physical Exam  General Appearance- Not in acute distress.  HEENT Eyes- Scleraeral/Conjuntiva-bilat- Not Yellow. Mouth & Throat- Normal.  Chest and Lung Exam Auscultation: Breath sounds:-Normal. Adventitious sounds:- No Adventitious sounds.  Cardiovascular Auscultation:Rythm - Regular. Heart Sounds -Normal heart sounds.  Abdomen Inspection:-Inspection Normal.  Palpation/Perucssion: Palpation and Percussion of the abdomen reveal- mild-moderate lllqTender, No Rebound tenderness, No rigidity(Guarding) and No Palpable abdominal masses.  Liver:-Normal.  Spleen:- Normal.   Back- no cva tenderness      Assessment & Plan:  For you current symptoms. I would advise continuing cipro and flagyl.   We got you in with GI specialist tomorrow. [name] PA-C [facility] at 9 am)  Follow up with pcp as regularly scheduled or as needed with her or myself.

## 2015-12-20 ENCOUNTER — Telehealth: Payer: Self-pay | Admitting: Family

## 2015-12-20 NOTE — Telephone Encounter (Signed)
Message left to call the office.    KP 

## 2015-12-20 NOTE — Telephone Encounter (Signed)
Caller name: Tamarius   Relationship to patient: Self   Can be reached: [phone]  Reason for call: Pt says that he need a copy of his foot x-ray. Pt would like to be called when ready for pick up.

## 2015-12-20 NOTE — Telephone Encounter (Signed)
Pt returned call. Informed pt of the below. Pt expressed understanding.

## 2015-12-21 NOTE — Telephone Encounter (Signed)
I see no record of previous foot xray. Per verbal from front office staff, pt was notified of that and nothing further is needed.

## 2015-12-24 ENCOUNTER — Telehealth: Payer: Self-pay | Admitting: Family

## 2015-12-24 NOTE — Telephone Encounter (Addendum)
Received call from [place] re: Justin [name].  He is following patient for L foot pain, paresthesia, low back pain. He wanted to know if I had ordered CT abd/pelvis and I told him that I believe this was most likely ordered by [facility].  He will contact them.

## 2016-01-04 ENCOUNTER — Other Ambulatory Visit: Payer: Self-pay | Admitting: Family

## 2016-01-04 ENCOUNTER — Other Ambulatory Visit: Payer: Self-pay

## 2016-01-04 MED ORDER — DIAZEPAM 5 MG PO TABS: 5.0000 mg | ORAL_TABLET | Freq: Every day | ORAL | Status: DC

## 2016-01-04 NOTE — Telephone Encounter (Signed)
°  Pharmacy: [id number] - [place], [place] - [address] AT [address]  Reason for call: pt needs refill on diazepam. He has 2 left and takes 1/day.

## 2016-01-04 NOTE — Telephone Encounter (Signed)
Faxed Rx to pharmacy  

## 2016-01-04 NOTE — Telephone Encounter (Signed)
OK to send 30 tablets.

## 2016-01-07 ENCOUNTER — Ambulatory Visit (INDEPENDENT_AMBULATORY_CARE_PROVIDER_SITE_OTHER): Payer: Managed Care, Other (non HMO) | Admitting: Family

## 2016-01-07 ENCOUNTER — Encounter: Payer: Self-pay | Admitting: Family

## 2016-01-07 ENCOUNTER — Telehealth: Payer: Self-pay | Admitting: Family

## 2016-01-07 VITALS — BP 124/88 | HR 65 | Temp 97.7°F | Resp 16 | Ht 70.5 in | Wt 165.0 lb

## 2016-01-07 DIAGNOSIS — K589 Irritable bowel syndrome without diarrhea: Secondary | ICD-10-CM | POA: Diagnosis not present

## 2016-01-07 DIAGNOSIS — Z0001 Encounter for general adult medical examination with abnormal findings: Secondary | ICD-10-CM

## 2016-01-07 DIAGNOSIS — Z0000 Encounter for general adult medical examination without abnormal findings: Secondary | ICD-10-CM

## 2016-01-07 DIAGNOSIS — R079 Chest pain, unspecified: Secondary | ICD-10-CM

## 2016-01-07 DIAGNOSIS — M25571 Pain in right ankle and joints of right foot: Secondary | ICD-10-CM

## 2016-01-07 DIAGNOSIS — G8929 Other chronic pain: Secondary | ICD-10-CM | POA: Diagnosis not present

## 2016-01-07 LAB — HEPATITIS C ANTIBODY: HCV AB: NEGATIVE

## 2016-01-07 LAB — LIPID PANEL
CHOLESTEROL: 174 mg/dL (ref 0–200)
HDL: 44.7 mg/dL (ref >39.00)
LDL Cholesterol: 115 mg/dL — ABNORMAL HIGH (ref 0–99)
NonHDL: 129.03
TRIGLYCERIDES: 68 mg/dL (ref 0.0–149.0)
Total CHOL/HDL Ratio: 4
VLDL: 13.6 mg/dL (ref 0.0–40.0)

## 2016-01-07 LAB — PSA: PSA: 1.02 ng/mL (ref 0.10–4.00)

## 2016-01-07 LAB — TSH: TSH: 1.05 u[IU]/mL (ref 0.35–4.50)

## 2016-01-07 MED ORDER — HYDROCODONE-ACETAMINOPHEN 5-325 MG PO TABS: 1.0000 | ORAL_TABLET | ORAL | Status: DC | PRN

## 2016-01-07 NOTE — Assessment & Plan Note (Signed)
Uncontrolled. Advised pt to follow up with GI.  In regards to his weight loss, advised pt to add 2 cans of ensure a day.  Monitor weight.  Call if further weight loss. I think his weight loss may be related to his increased activity.

## 2016-01-07 NOTE — Telephone Encounter (Signed)
Please let pt know that in addition to referring him for stress test, I would like to refer him to meet with the cardiologist for consultation of his chest pain.

## 2016-01-07 NOTE — Progress Notes (Signed)
Pre visit review using our clinic review tool, if applicable. No additional management support is needed unless otherwise documented below in the visit note. 

## 2016-01-07 NOTE — Progress Notes (Signed)
Subjective:    Patient ID: Justin [name], male    DOB: 03/03/1956, 60 y.o.   MRN: [id number]  HPI  Patient presents today for complete physical.  Immunizations: tetanus is up to date.  Diet: reports healthy diet. Reports that he is more active due to more work responsibilities.  Exercise: no formal exercise, active at his job.  Colonoscopy: 2014  Abdominal pain/cramping- pt has lost 7 pounds since February. Reports GI upset, diarrhea.  Saw GI at [facility].  He had a CT abd/pelvis which did not show recurrent CA or other acute findings. They added bentyl which has not helped.  Notes that he has been more active lately then previously.    Chest Pain- last episode was Saturday, left arm and left side of chest.  + SOB when this occurs. Brother has hx of CAD. Dad had MI at age 67.   Right ankle pain- saw ortho, would like a second opinion, continues to have pain, interferes with walking due to pain.   Review of Systems  Constitutional: Positive for unexpected weight change.  HENT: Negative for rhinorrhea.   Respiratory: Negative for cough.   Cardiovascular: Positive for chest pain.  Gastrointestinal: Positive for diarrhea.  Genitourinary: Negative for dysuria and frequency.  Psychiatric/Behavioral:       Reports mood is much better     Past Medical History  Diagnosis Date  . Colon cancer [facility]) 2003    specialist due for repeat colonoscopy in 2012  . Anxiety     takes approximately 10 diazepam/month-contracts to no more than that amount  . Depression   . Hyperlipidemia   . BPH (benign prostatic hypertrophy)     sees urology  . Overactive bladder   . Multiple fractures 08/2008    patient fell off a rood about 13 feet onto concrete-multiple fractures including vertrebral  . History of kidney stones   . Insomnia     Social History   Social History  . Marital Status: Married    Spouse Name: N/A  . Number of Children: N/A  . Years of Education: N/A   Occupational  History  . Not on file.   Social History Main Topics  . Smoking status: Former Research scientist (life sciences)  . Smokeless tobacco: Never Used     Comment: quit tobacco 2000 smoked for 20 years  . Alcohol Use: No     Comment: former drinker  . Drug Use: No  . Sexual Activity: Not on file   Other Topics Concern  . 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 Hip fracture surgery    . Elbow surgery    . Hernia repair    . Colectomy  02/2002    sigmoid colectomy    Family History  Problem Relation Age of Onset  . Coronary artery disease Father   . Other Neg Hx     no FH od colon cancer    No Known Allergies  Current Outpatient Prescriptions on File Prior to Visit  Medication Sig Dispense Refill  . diazepam (VALIUM) 5 MG tablet Take 1 tablet (5 mg total)  by mouth at bedtime. 30 tablet 0  . escitalopram (LEXAPRO) 20 MG tablet TAKE 1 TABLET (20 MG TOTAL) BY MOUTH DAILY. 90 tablet 1  . gabapentin (NEURONTIN) 300 MG capsule TAKE 1 CAPSULE (300 MG TOTAL) BY MOUTH 2 (TWO) TIMES DAILY. 60 capsule 1  . HYDROcodone-acetaminophen (NORCO/VICODIN) 5-325 MG tablet Take 1-2 tablets by mouth every 4 (four) hours as needed. 15 tablet 0  . tadalafil (CIALIS) 10 MG tablet 1/2 to 1 tab by mouth daily as needed prior to sexual activity. 10 tablet 5  . traZODone (DESYREL) 150 MG tablet TAKE 1 & 1/2 TABLETS BY MOUTH AT BEDTIME 135 tablet 1   No current facility-administered medications on file prior to visit.    BP 124/88 mmHg  Pulse 65  Temp(Src) 97.7 F (36.5 C) (Oral)  Resp 16  Ht 5' 10.5" (1.791 m)  Wt 165 lb (74.844 kg)  BMI 23.33 kg/m2  SpO2 97%       Objective:   Physical Exam Physical Exam    Constitutional: He is oriented to person, place, and time. He appears well-developed and well-nourished. No distress.  HENT:  Head: Normocephalic and atraumatic.  Right Ear: Tympanic membrane and ear canal normal.  Left Ear: Tympanic membrane and ear canal normal.  Mouth/Throat: Oropharynx is clear and moist.  Eyes: Pupils are equal, round, and reactive to light. No scleral icterus.  Neck: Normal range of motion. No thyromegaly present.  Cardiovascular: Normal rate and regular rhythm.   No murmur heard. Pulmonary/Chest: Effort normal and breath sounds normal. No respiratory distress. He has no wheezes. He has no rales. He exhibits no tenderness.  Abdominal: Soft. Bowel sounds are normal. He exhibits no distension and no mass. There is no tenderness. There is no rebound and no guarding.  Musculoskeletal: He exhibits no edema.  Lymphadenopathy:    He has no cervical adenopathy.  Neurological: He is alert and oriented to person, place, and time. He has normal patellar reflexes. He exhibits normal muscle tone. Coordination normal.  Skin: Skin is warm and dry.  Psychiatric: He has a normal mood and affect. His behavior is normal. Judgment and thought content normal.          Assessment & Plan:   Chest pain- concerning for ischemia.  Obtain nuclear stress test. Pt is advised to go directly to the ED if he develops recurrent chest pain.  Refer to cardiology as well (EKG is reviewed- notes left anterior fascicular block) See phone note 01/07/16.   Ankle Pain- refer to [facility] for second opinion.      Assessment & Plan:

## 2016-01-07 NOTE — Patient Instructions (Signed)
Please complete lab work prior to leaving. Please contact your GI office to arrange follow up. Continue to monitor your weight.  Add ensure 1 can twice daily. Call if you have any further weight loss. Go to ER if you develop recurrent chest pain. You will be contacted about your stress test.  Please let me know if you have not heard back in 1 week about scheduling.

## 2016-01-07 NOTE — Assessment & Plan Note (Signed)
Discussed monitoring weight. Immunizations reviewed and up to date. Obtain routine lab work.

## 2016-01-09 NOTE — Telephone Encounter (Signed)
Left detailed message on home# and to call if any questions. 

## 2016-01-10 ENCOUNTER — Telehealth (HOSPITAL_COMMUNITY): Payer: Self-pay | Admitting: *Deleted

## 2016-01-10 NOTE — Telephone Encounter (Signed)
Patient given detailed instructions per Myocardial Perfusion Study Information Sheet for the test on 01/15/16 at 0730. Patient notified to arrive 15 minutes early and that it is imperative to arrive on time for appointment to keep from having the test rescheduled.  If you need to cancel or reschedule your appointment, please call the office within 24 hours of your appointment. Failure to do so may result in a cancellation of your appointment, and a $50 no show fee. Patient verbalized understanding.Justin [name], Ranae Palms

## 2016-01-15 ENCOUNTER — Ambulatory Visit (HOSPITAL_COMMUNITY): Payer: Managed Care, Other (non HMO) | Attending: Cardiology

## 2016-01-15 DIAGNOSIS — Z8249 Family history of ischemic heart disease and other diseases of the circulatory system: Secondary | ICD-10-CM | POA: Insufficient documentation

## 2016-01-15 DIAGNOSIS — R079 Chest pain, unspecified: Secondary | ICD-10-CM

## 2016-01-15 DIAGNOSIS — R002 Palpitations: Secondary | ICD-10-CM | POA: Insufficient documentation

## 2016-01-15 DIAGNOSIS — R0609 Other forms of dyspnea: Secondary | ICD-10-CM | POA: Insufficient documentation

## 2016-01-15 LAB — MYOCARDIAL PERFUSION IMAGING
CHL CUP MPHR: 159 {beats}/min
CHL CUP NUCLEAR SRS: 2
CHL CUP NUCLEAR SSS: 5
CSEPEDS: 45 s
Estimated workload: 12.9 METS
Exercise duration (min): 10 min
LHR: 0.25
LV sys vol: 49 mL
LVDIAVOL: 129 mL (ref 62–150)
NUC STRESS TID: 0.85
Peak HR: 153 {beats}/min
Percent HR: 96 %
RPE: 18
Rest HR: 53 {beats}/min
SDS: 3

## 2016-01-15 MED ORDER — TECHNETIUM TC 99M SESTAMIBI GENERIC - CARDIOLITE
10.5000 | Freq: Once | INTRAVENOUS | Status: AC | PRN
  Administered 2016-01-15: 11 via INTRAVENOUS

## 2016-01-15 MED ORDER — TECHNETIUM TC 99M SESTAMIBI GENERIC - CARDIOLITE
33.0000 | Freq: Once | INTRAVENOUS | Status: AC | PRN
  Administered 2016-01-15: 33 via INTRAVENOUS

## 2016-01-28 ENCOUNTER — Encounter: Payer: Self-pay | Admitting: Gastroenterology

## 2016-02-05 ENCOUNTER — Other Ambulatory Visit: Payer: Self-pay | Admitting: Family

## 2016-02-05 NOTE — Telephone Encounter (Signed)
Med:  Diazepam 5mg , #30 Last RF:   01/04/16 Next UDS:  Due now Next OV:  03/2016  Rx printed and forwarded to PCP for signature.

## 2016-02-06 NOTE — Telephone Encounter (Signed)
Rx faxed to pharmacy  

## 2016-03-04 ENCOUNTER — Telehealth: Payer: Self-pay | Admitting: Family

## 2016-03-04 MED ORDER — SILDENAFIL CITRATE 20 MG PO TABS: ORAL_TABLET | ORAL | Status: DC

## 2016-03-04 NOTE — Telephone Encounter (Signed)
Notified pt and he was already contacted by pharmacy.

## 2016-03-04 NOTE — Telephone Encounter (Signed)
Spoke with pt. He states #30 tablets of Cialis will cost $600. He is wanting to try viagra (sildenafil) through [facility]. 20mg  tablets (5 tablets are $10). Please advise if ok to change and if so, what strength / directions?

## 2016-03-04 NOTE — Telephone Encounter (Signed)
°  Relation to [id number] Call back number:[phone] [facility]  Reason for call: pt is needing rx tadalafil (CIALIS) 10 MG tablet, pt would like to know if the viagra single packs is cheaper if he only needs 10.

## 2016-03-04 NOTE — Telephone Encounter (Signed)
Rx sent to [facility].

## 2016-03-07 ENCOUNTER — Telehealth: Payer: Self-pay | Admitting: Family

## 2016-03-07 NOTE — Telephone Encounter (Signed)
New Message  This message is to inform you that we have made 3 consecutive attempts to contact the patient. We have also mailed a letter to the patient to inform them to call in and schedule. Although we were unsuccessful in these attempts we wanted you to be aware of our efforts. Will remove the patient from our referral work queue at this time.    [facility]

## 2016-03-17 ENCOUNTER — Encounter (HOSPITAL_BASED_OUTPATIENT_CLINIC_OR_DEPARTMENT_OTHER): Payer: Self-pay

## 2016-03-17 ENCOUNTER — Emergency Department (HOSPITAL_BASED_OUTPATIENT_CLINIC_OR_DEPARTMENT_OTHER)
Admission: EM | Admit: 2016-03-17 | Discharge: 2016-03-17 | Disposition: A | Discharge status: Home / Self Care | Payer: Managed Care, Other (non HMO) | Attending: Emergency Medicine | Admitting: Emergency Medicine

## 2016-03-17 DIAGNOSIS — Y999 Unspecified external cause status: Secondary | ICD-10-CM | POA: Diagnosis not present

## 2016-03-17 DIAGNOSIS — Z85038 Personal history of other malignant neoplasm of large intestine: Secondary | ICD-10-CM | POA: Diagnosis not present

## 2016-03-17 DIAGNOSIS — Y9389 Activity, other specified: Secondary | ICD-10-CM | POA: Diagnosis not present

## 2016-03-17 DIAGNOSIS — E785 Hyperlipidemia, unspecified: Secondary | ICD-10-CM | POA: Diagnosis not present

## 2016-03-17 DIAGNOSIS — X58XXXA Exposure to other specified factors, initial encounter: Secondary | ICD-10-CM | POA: Insufficient documentation

## 2016-03-17 DIAGNOSIS — Z87891 Personal history of nicotine dependence: Secondary | ICD-10-CM | POA: Insufficient documentation

## 2016-03-17 DIAGNOSIS — F329 Major depressive disorder, single episode, unspecified: Secondary | ICD-10-CM | POA: Insufficient documentation

## 2016-03-17 DIAGNOSIS — Y929 Unspecified place or not applicable: Secondary | ICD-10-CM | POA: Insufficient documentation

## 2016-03-17 DIAGNOSIS — S0502XA Injury of conjunctiva and corneal abrasion without foreign body, left eye, initial encounter: Secondary | ICD-10-CM | POA: Diagnosis present

## 2016-03-17 MED ORDER — TETRACAINE HCL 0.5 % OP SOLN
2.0000 [drp] | Freq: Once | OPHTHALMIC | Status: AC
  Administered 2016-03-17: 2 [drp] via OPHTHALMIC
  Filled 2016-03-17: qty 4

## 2016-03-17 MED ORDER — FLUORESCEIN SODIUM 1 MG OP STRP
1.0000 | ORAL_STRIP | Freq: Once | OPHTHALMIC | Status: AC
  Administered 2016-03-17: 1 via OPHTHALMIC
  Filled 2016-03-17: qty 1

## 2016-03-17 MED ORDER — POLYMYXIN B-TRIMETHOPRIM 10000-0.1 UNIT/ML-% OP SOLN: 1.0000 [drp] | OPHTHALMIC | Status: DC

## 2016-03-17 NOTE — ED Notes (Signed)
?  wood in left eye today while sawing tree limbs

## 2016-03-17 NOTE — ED Notes (Signed)
EDP at [facility] 

## 2016-03-17 NOTE — ED Provider Notes (Signed)
CSN: [id number]     Arrival date & time 03/17/16  1941 History  By signing my name below, I, Justin [name], attest that this documentation has been prepared under the direction and in the presence of Dorie Rank, MD. Electronically Signed: Hansel [name], ED Scribe. 03/17/2016. 8:07 PM.    Chief Complaint  Patient presents with  . Foreign Body in Eye   The history is provided by the patient. No language interpreter was used.   HPI Comments: Justin [name] is a 60 y.o. male who presents to the Emergency Department complaining of a foreign body sensation in the left eye onset this afternoon. Pt states he was sawing branches and believes a piece of wood may have landed in or scratched his eye. He reports that he tried flushing his eye with water at home with no relief of symptoms. Tdap UTD. He denies visual disturbance, HA, photophobia.   Past Medical History  Diagnosis Date  . Colon cancer [facility]) 2003    specialist due for repeat colonoscopy in 2012  . Anxiety     takes approximately 10 diazepam/month-contracts to no more than that amount  . Depression   . Hyperlipidemia   . BPH (benign prostatic hypertrophy)     sees urology  . Overactive bladder   . Multiple fractures 08/2008    patient fell off a rood about 13 feet onto concrete-multiple fractures including vertrebral  . History of kidney stones   . Insomnia    Past Surgical History  Procedure Laterality Date  . Hip fracture surgery    . Elbow surgery    . Hernia repair    . Colectomy  02/2002    sigmoid colectomy   Family History  Problem Relation Age of Onset  . Coronary artery disease Father   . Other Neg Hx     no FH od colon cancer   Social History  Substance Use Topics  . Smoking status: Former Research scientist (life sciences)  . Smokeless tobacco: Never Used     Comment: quit tobacco 2000 smoked for 20 years  . Alcohol Use: No    Review of Systems  Eyes: Negative for photophobia and visual disturbance.       +foreign body sensation left  eye  Neurological: Negative for headaches.  All other systems reviewed and are negative.  Allergies  Review of patient's allergies indicates no known allergies.  Home Medications   Prior to Admission medications   Medication Sig Start Date End Date Taking? Authorizing Provider  diazepam (VALIUM) 5 MG tablet TAKE 1 TABLET BY MOUTH AT BEDTIME 02/05/16   Debbrah Alar, NP  dicyclomine (BENTYL) 20 MG tablet Take 20 mg by mouth 4 (four) times daily as needed. 12/12/15   Historical Provider, MD  escitalopram (LEXAPRO) 20 MG tablet TAKE 1 TABLET (20 MG TOTAL) BY MOUTH DAILY. 11/09/15   Debbrah Alar, NP  gabapentin (NEURONTIN) 300 MG capsule TAKE 1 CAPSULE (300 MG TOTAL) BY MOUTH 2 (TWO) TIMES DAILY. 11/26/15   Debbrah Alar, NP  HYDROcodone-acetaminophen (NORCO/VICODIN) 5-325 MG tablet Take 1-2 tablets by mouth every 4 (four) hours as needed. 01/07/16   Debbrah Alar, NP  sildenafil (REVATIO) 20 MG tablet 1-2 tabs by mouth once daily as needed 03/04/16   Debbrah Alar, NP  traZODone (DESYREL) 150 MG tablet TAKE 1 & 1/2 TABLETS BY MOUTH AT BEDTIME 11/09/15   Debbrah Alar, NP  trimethoprim-polymyxin b (POLYTRIM) ophthalmic solution Place 1 drop into the left eye every 4 (four) hours. 03/17/16  Dorie Rank, MD   BP 135/92 mmHg  Pulse 67  Temp(Src) 98.1 F (36.7 C) (Oral)  Resp 18  Ht 5\' 11"  (1.803 m)  Wt 77.111 kg  BMI 23.72 kg/m2  SpO2 97% Physical Exam  Constitutional: He appears well-developed and well-nourished. No distress.  HENT:  Head: Normocephalic and atraumatic.  Right Ear: External ear normal.  Left Ear: External ear normal.  Eyes: Conjunctivae are normal. Pupils are equal, round, and reactive to light. Right eye exhibits no discharge. Left eye exhibits no discharge. No foreign body present in the left eye. Left conjunctiva is not injected. Left conjunctiva has no hemorrhage. No scleral icterus. Left eye exhibits normal extraocular motion.  Slit lamp exam:       The left eye shows corneal abrasion ( ). The left eye shows no foreign body.    Lids everted, no foreign body noted.   Neck: Neck supple. No tracheal deviation present.  Cardiovascular: Normal rate.   Pulmonary/Chest: Effort normal. No stridor. No respiratory distress.  Musculoskeletal: He exhibits no edema.  Neurological: He is alert. Cranial nerve deficit: no gross deficits.  Skin: Skin is warm and dry. No rash noted.  Psychiatric: He has a normal mood and affect.  Nursing note and vitals reviewed.   ED Course  Procedures (including critical care time) DIAGNOSTIC STUDIES: Oxygen Saturation is 97% on RA, normal by my interpretation.    COORDINATION OF CARE: 8:04 PM Discussed treatment plan with pt at bedside which includes ophthalmology f/u and pt agreed to plan.   MDM   Final diagnoses:  Corneal abrasion, left, initial encounter    No foreign body noted on exam. There is a small corneal abrasion. I suspect this is the source of his persistent eye irritation.  We'll discharge home with antibiotic eyedrops and recommend follow-up with an eye doctor if symptoms do not resolve in the next few days.  I personally performed the services described in this documentation, which was scribed in my presence.  The recorded information has been reviewed and is accurate.   Dorie Rank, MD 03/17/16 [id number]

## 2016-03-17 NOTE — Discharge Instructions (Signed)

## 2016-03-18 ENCOUNTER — Other Ambulatory Visit: Payer: Self-pay | Admitting: Family

## 2016-03-18 NOTE — Telephone Encounter (Signed)
Pt is requesting refill on Diazepam 5 mg tab.  Last OV: 01/07/2016 Last Fill: 02/05/2016 #30 and 0RF UDS: 10/31/2015 Moderate risk  Please advise.

## 2016-03-18 NOTE — Telephone Encounter (Signed)
See rx. 

## 2016-03-18 NOTE — Telephone Encounter (Signed)
Rx faxed to pharmacy  

## 2016-04-04 ENCOUNTER — Encounter (HOSPITAL_BASED_OUTPATIENT_CLINIC_OR_DEPARTMENT_OTHER): Payer: Self-pay | Admitting: *Deleted

## 2016-04-04 ENCOUNTER — Inpatient Hospital Stay (HOSPITAL_BASED_OUTPATIENT_CLINIC_OR_DEPARTMENT_OTHER)
Admission: EM | Admit: 2016-04-04 | Discharge: 2016-04-08 | DRG: 390 | Disposition: A | Discharge status: Home / Self Care | Payer: Managed Care, Other (non HMO) | Attending: General Surgery | Admitting: General Surgery

## 2016-04-04 ENCOUNTER — Emergency Department (HOSPITAL_BASED_OUTPATIENT_CLINIC_OR_DEPARTMENT_OTHER): Payer: Managed Care, Other (non HMO)

## 2016-04-04 DIAGNOSIS — Z9049 Acquired absence of other specified parts of digestive tract: Secondary | ICD-10-CM | POA: Diagnosis not present

## 2016-04-04 DIAGNOSIS — F419 Anxiety disorder, unspecified: Secondary | ICD-10-CM | POA: Diagnosis present

## 2016-04-04 DIAGNOSIS — K5660 Unspecified intestinal obstruction: Secondary | ICD-10-CM | POA: Diagnosis present

## 2016-04-04 DIAGNOSIS — Z0189 Encounter for other specified special examinations: Secondary | ICD-10-CM

## 2016-04-04 DIAGNOSIS — Z87891 Personal history of nicotine dependence: Secondary | ICD-10-CM

## 2016-04-04 DIAGNOSIS — K56609 Unspecified intestinal obstruction, unspecified as to partial versus complete obstruction: Secondary | ICD-10-CM

## 2016-04-04 DIAGNOSIS — F329 Major depressive disorder, single episode, unspecified: Secondary | ICD-10-CM | POA: Diagnosis present

## 2016-04-04 DIAGNOSIS — Z8249 Family history of ischemic heart disease and other diseases of the circulatory system: Secondary | ICD-10-CM

## 2016-04-04 DIAGNOSIS — M549 Dorsalgia, unspecified: Secondary | ICD-10-CM | POA: Diagnosis present

## 2016-04-04 DIAGNOSIS — E785 Hyperlipidemia, unspecified: Secondary | ICD-10-CM | POA: Diagnosis present

## 2016-04-04 DIAGNOSIS — R109 Unspecified abdominal pain: Secondary | ICD-10-CM | POA: Diagnosis present

## 2016-04-04 DIAGNOSIS — Z79899 Other long term (current) drug therapy: Secondary | ICD-10-CM | POA: Diagnosis not present

## 2016-04-04 DIAGNOSIS — G8929 Other chronic pain: Secondary | ICD-10-CM | POA: Diagnosis present

## 2016-04-04 DIAGNOSIS — N400 Enlarged prostate without lower urinary tract symptoms: Secondary | ICD-10-CM | POA: Diagnosis present

## 2016-04-04 DIAGNOSIS — G4700 Insomnia, unspecified: Secondary | ICD-10-CM | POA: Diagnosis present

## 2016-04-04 DIAGNOSIS — N3281 Overactive bladder: Secondary | ICD-10-CM | POA: Diagnosis present

## 2016-04-04 DIAGNOSIS — Z87442 Personal history of urinary calculi: Secondary | ICD-10-CM

## 2016-04-04 DIAGNOSIS — Z85048 Personal history of other malignant neoplasm of rectum, rectosigmoid junction, and anus: Secondary | ICD-10-CM | POA: Diagnosis not present

## 2016-04-04 HISTORY — DX: Unspecified intestinal obstruction, unspecified as to partial versus complete obstruction: K56.609

## 2016-04-04 LAB — COMPREHENSIVE METABOLIC PANEL
ALT: 20 U/L (ref 17–63)
ANION GAP: 14 (ref 5–15)
AST: 30 U/L (ref 15–41)
Albumin: 4.9 g/dL (ref 3.5–5.0)
Alkaline Phosphatase: 165 U/L — ABNORMAL HIGH (ref 38–126)
BUN: 22 mg/dL — ABNORMAL HIGH (ref 6–20)
CHLORIDE: 100 mmol/L — AB (ref 101–111)
CO2: 25 mmol/L (ref 22–32)
Calcium: 10.4 mg/dL — ABNORMAL HIGH (ref 8.9–10.3)
Creatinine, Ser: 1.07 mg/dL (ref 0.61–1.24)
Glucose, Bld: 195 mg/dL — ABNORMAL HIGH (ref 65–99)
POTASSIUM: 4 mmol/L (ref 3.5–5.1)
Sodium: 139 mmol/L (ref 135–145)
Total Bilirubin: 1.3 mg/dL — ABNORMAL HIGH (ref 0.3–1.2)
Total Protein: 8.6 g/dL — ABNORMAL HIGH (ref 6.5–8.1)

## 2016-04-04 LAB — URINALYSIS, ROUTINE W REFLEX MICROSCOPIC
Glucose, UA: NEGATIVE mg/dL
Hgb urine dipstick: NEGATIVE
Ketones, ur: >80 mg/dL — AB
Nitrite: NEGATIVE
Protein, ur: 30 mg/dL — AB
Specific Gravity, Urine: 1.026 (ref 1.005–1.030)
pH: 7 (ref 5.0–8.0)

## 2016-04-04 LAB — CBC
HCT: 52.4 % — ABNORMAL HIGH (ref 39.0–52.0)
Hemoglobin: 17.7 g/dL — ABNORMAL HIGH (ref 13.0–17.0)
MCH: 32.8 pg (ref 26.0–34.0)
MCHC: 33.8 g/dL (ref 30.0–36.0)
MCV: 97 fL (ref 78.0–100.0)
Platelets: 277 K/uL (ref 150–400)
RBC: 5.4 MIL/uL (ref 4.22–5.81)
RDW: 13.1 % (ref 11.5–15.5)
WBC: 18.4 K/uL — ABNORMAL HIGH (ref 4.0–10.5)

## 2016-04-04 LAB — URINE MICROSCOPIC-ADD ON

## 2016-04-04 LAB — LIPASE, BLOOD: Lipase: 14 U/L (ref 11–51)

## 2016-04-04 MED ORDER — ONDANSETRON HCL 4 MG/2ML IJ SOLN
4.0000 mg | Freq: Once | INTRAMUSCULAR | Status: AC
  Administered 2016-04-04: 4 mg via INTRAVENOUS
  Filled 2016-04-04: qty 2

## 2016-04-04 MED ORDER — IOPAMIDOL (ISOVUE-300) INJECTION 61%
100.0000 mL | Freq: Once | INTRAVENOUS | Status: AC | PRN
  Administered 2016-04-04: 100 mL via INTRAVENOUS

## 2016-04-04 MED ORDER — HYDROMORPHONE HCL 1 MG/ML IJ SOLN
1.0000 mg | Freq: Once | INTRAMUSCULAR | Status: AC
  Administered 2016-04-04: 1 mg via INTRAVENOUS
  Filled 2016-04-04: qty 1

## 2016-04-04 MED ORDER — FENTANYL CITRATE (PF) 100 MCG/2ML IJ SOLN
50.0000 ug | INTRAMUSCULAR | Status: DC | PRN
  Administered 2016-04-04: 50 ug via INTRAVENOUS

## 2016-04-04 MED ORDER — SODIUM CHLORIDE 0.9 % IV SOLN
1000.0000 mL | INTRAVENOUS | Status: DC
  Administered 2016-04-04: 1000 mL via INTRAVENOUS

## 2016-04-04 MED ORDER — ONDANSETRON 4 MG PO TBDP
ORAL_TABLET | ORAL | Status: AC
  Filled 2016-04-04: qty 1

## 2016-04-04 MED ORDER — ONDANSETRON 4 MG PO TBDP
4.0000 mg | ORAL_TABLET | Freq: Once | ORAL | Status: AC
  Administered 2016-04-04: 4 mg via ORAL

## 2016-04-04 MED ORDER — SODIUM CHLORIDE 0.9 % IV SOLN
1000.0000 mL | Freq: Once | INTRAVENOUS | Status: AC
  Administered 2016-04-04: 1000 mL via INTRAVENOUS

## 2016-04-04 MED ORDER — FENTANYL CITRATE (PF) 100 MCG/2ML IJ SOLN
INTRAMUSCULAR | Status: AC
  Administered 2016-04-04: 50 ug via INTRAVENOUS
  Filled 2016-04-04: qty 2

## 2016-04-04 NOTE — ED Provider Notes (Addendum)
CSN: [id number]     Arrival date & time 04/04/16  1936 History   First MD Initiated Contact with Patient 04/04/16 2031     Chief Complaint  Patient presents with  . Abdominal Pain     (Consider location/radiation/quality/duration/timing/severity/associated sxs/prior Treatment) HPI Patient ate normal breakfast and lunch. He reports shortly after lunch she started getting a pain in his left lower abdomen that progressed rapidly. It's an intense aching and cramping feeling. He reports he vomited several times. Patient tried taking some magnesium citrate thinking that maybe he was constipated. There was no improvement. She was last bowel movement was yesterday. Patient has a history of colon cancer. That was successfully treated and he has not had recurrence. He also reports he has a history of diverticulitis. Past Medical History  Diagnosis Date  . Colon cancer [facility]) 2003    specialist due for repeat colonoscopy in 2012  . Anxiety     takes approximately 10 diazepam/month-contracts to no more than that amount  . Depression   . Hyperlipidemia   . BPH (benign prostatic hypertrophy)     sees urology  . Overactive bladder   . Multiple fractures 08/2008    patient fell off a rood about 13 feet onto concrete-multiple fractures including vertrebral  . History of kidney stones   . Insomnia    Past Surgical History  Procedure Laterality Date  . Hip fracture surgery    . Elbow surgery    . Hernia repair    . Colectomy  02/2002    sigmoid colectomy   Family History  Problem Relation Age of Onset  . Coronary artery disease Father   . Other Neg Hx     no FH od colon cancer   Social History  Substance Use Topics  . Smoking status: Former Research scientist (life sciences)  . Smokeless tobacco: Never Used     Comment: quit tobacco 2000 smoked for 20 years  . Alcohol Use: No    Review of Systems 10 Systems reviewed and are negative for acute change except as noted in the HPI.    Allergies  Review of  patient's allergies indicates no known allergies.  Home Medications   Prior to Admission medications   Medication Sig Start Date End Date Taking? Authorizing Provider  diazepam (VALIUM) 5 MG tablet TAKE 1 TABLET BY MOUTH AT BEDTIME 03/18/16  Yes Debbrah Alar, NP  escitalopram (LEXAPRO) 20 MG tablet TAKE 1 TABLET (20 MG TOTAL) BY MOUTH DAILY. 11/09/15  Yes Debbrah Alar, NP  gabapentin (NEURONTIN) 300 MG capsule TAKE 1 CAPSULE (300 MG TOTAL) BY MOUTH 2 (TWO) TIMES DAILY. 11/26/15  Yes Debbrah Alar, NP  HYDROcodone-acetaminophen (NORCO/VICODIN) 5-325 MG tablet Take 1-2 tablets by mouth every 4 (four) hours as needed. 01/07/16  Yes Debbrah Alar, NP  traZODone (DESYREL) 150 MG tablet TAKE 1 & 1/2 TABLETS BY MOUTH AT BEDTIME 11/09/15  Yes Debbrah Alar, NP  sildenafil (REVATIO) 20 MG tablet 1-2 tabs by mouth once daily as needed 03/04/16   Debbrah Alar, NP   BP 146/76 mmHg  Pulse 66  Temp(Src) 98.6 F (37 C) (Oral)  Resp 18  Ht 5\' 11"  (1.803 m)  Wt 170 lb (77.111 kg)  BMI 23.72 kg/m2  SpO2 100% Physical Exam  Constitutional: He is oriented to person, place, and time. He appears well-developed and well-nourished.  Patient appears to be in moderate to severe pain. He is nontoxic and alert. No respiratory distress.  HENT:  Head: Normocephalic and atraumatic.  Eyes: EOM  are normal. Pupils are equal, round, and reactive to light.  Neck: Neck supple.  Cardiovascular: Normal rate, regular rhythm, normal heart sounds and intact distal pulses.   Pulmonary/Chest: Effort normal and breath sounds normal.  Abdominal: Soft. Bowel sounds are normal. He exhibits no distension. There is tenderness.  Left lower quadrant tenderness to palpation.  Musculoskeletal: Normal range of motion. He exhibits no edema or tenderness.  Neurological: He is alert and oriented to person, place, and time. He has normal strength. Coordination normal. GCS eye subscore is 4. GCS verbal subscore is  5. GCS motor subscore is 6.  Skin: Skin is warm, dry and intact.  Psychiatric: He has a normal mood and affect.    ED Course  Procedures (including critical care time) Labs Review Labs Reviewed  COMPREHENSIVE METABOLIC PANEL - Abnormal; Notable for the following:    Chloride 100 (*)    Glucose, Bld 195 (*)    BUN 22 (*)    Calcium 10.4 (*)    Total Protein 8.6 (*)    Alkaline Phosphatase 165 (*)    Total Bilirubin 1.3 (*)    All other components within normal limits  CBC - Abnormal; Notable for the following:    WBC 18.4 (*)    Hemoglobin 17.7 (*)    HCT 52.4 (*)    All other components within normal limits  URINALYSIS, ROUTINE W REFLEX MICROSCOPIC (NOT AT [facility]) - Abnormal; Notable for the following:    Color, Urine AMBER (*)    APPearance CLOUDY (*)    Bilirubin Urine SMALL (*)    Ketones, ur >80 (*)    Protein, ur 30 (*)    Leukocytes, UA SMALL (*)    All other components within normal limits  URINE MICROSCOPIC-ADD ON - Abnormal; Notable for the following:    Squamous Epithelial / LPF 0-5 (*)    Bacteria, UA FEW (*)    All other components within normal limits  LIPASE, BLOOD    Imaging Review Ct Abdomen Pelvis W Contrast  04/04/2016  CLINICAL DATA:  Left lower abdominal pain. Pain onset earlier today. Vomiting. EXAM: CT ABDOMEN AND PELVIS WITH CONTRAST TECHNIQUE: Multidetector CT imaging of the abdomen and pelvis was performed using the standard protocol following bolus administration of intravenous contrast. CONTRAST:  171mL ISOVUE-300 IOPAMIDOL (ISOVUE-300) INJECTION 61% COMPARISON:  CT 12/26/2015 FINDINGS: Lower chest:  Suspected emphysema.  Dependent atelectasis. Liver: Scattered small hepatic hypodensities, largest measuring 10 mm, unchanged from prior exam. Some of the smaller lesions on prior exam are not well visualized currently. No new hepatic lesion. Hepatobiliary: Depending calcified gallstone. No pericholecystic inflammation. No biliary dilatation. Pancreas: No  ductal dilatation or inflammation. Spleen: Mildly enlarged measuring 13.5 x 13.4 x 4.2 cm, unchanged. No focal lesion. Adrenal glands: No nodule. Kidneys: Symmetric renal enhancement and excretion. No hydronephrosis. Large cyst from the upper right kidney is unchanged. There are scattered nonobstructing stones in both kidneys. Stomach/Bowel: Stomach is distended with ingested contrast. Small bowel loops are fluid-filled and prominent. The distal most small bowel loops are decompressed. Discrete transition point is not visualized, however suspected in the right pelvis, partially obscured by streak artifact from pelvis surgical hardware. No mesenteric edema. No pneumatosis. Liquid stool in the cecum and ascending colon. Formed stool in the transverse and descending colon. Enteric anastomosis in the rectosigmoid. Vascular/Lymphatic: No retroperitoneal or pelvic adenopathy. Abdominal aorta is normal in caliber. Atherosclerosis without aneurysm. Reproductive: Normal sized prostate gland. Bladder: Decompressed and not well evaluated. Other: No free  air, free fluid, or intra-abdominal fluid collection. Post left inguinal hernia repair with tacks. Stable soft tissue density in the right inguinal canal. Musculoskeletal: There are no acute or suspicious osseous abnormalities. Unchanged L1 compression deformity. Remote posttraumatic and postsurgical change involving the right hemipelvis. IMPRESSION: 1. Findings consistent with partial small bowel obstruction, suspected transition point in the right pelvis partially obscured by streak artifact from surgical hardware. 2. Additional chronic findings are stable and unchanged from prior. Electronically Signed   By: Jeb Levering M.D.   On: 04/04/2016 23:04   I have personally reviewed and evaluated these images and lab results as part of my medical decision-making.   EKG Interpretation None     Consult: Review Dr. Excell Seltzer. Will admit for further evaluation. MDM    Final diagnoses:  Small bowel obstruction ([place])   Patient had acute onset of abdominal pain and vomiting. CT shows partial small bowel obstruction. NG tube will be placed. Patient will be admitted for ongoing treatment. Patient is alert and nontoxic. Mental status is clear. No respiratory distress. Abdominal examination is for left lower quadrant tenderness without peritoneal signs.  Patient wishes to transport by private vehicle to [facility]. At this time, his vital signs are stable, he does not show signs of being septic or having an acute surgical abdomen. Patient has not vomited since administration of antiemetics. His transport for admission would be delayed due to lack of available ambulance transports at this time. Patient is stable for private vehicle transport and will proceed to [facility] remaining NPO for admission. Patient will need placement of NG tube upon arrival and resumption of IV fluids.    Charlesetta Shanks, MD 04/04/16 2340  Charlesetta Shanks, MD 04/05/16 [phone]

## 2016-04-04 NOTE — ED Notes (Addendum)
Per pt report abdominal pain stared today at lunch. Took a vicodin last night , pt reports" feel like he is stopped up ". Pt reports that he has vomited twice today. No fever or chills. Moderate anxiety present .

## 2016-04-04 NOTE — ED Notes (Signed)
Pt went to restroom, vomited x 1, offered zofran while waiting in lobby.

## 2016-04-05 ENCOUNTER — Inpatient Hospital Stay (HOSPITAL_COMMUNITY): Payer: Managed Care, Other (non HMO)

## 2016-04-05 LAB — CBC
HEMATOCRIT: 42.3 % (ref 39.0–52.0)
Hemoglobin: 13.9 g/dL (ref 13.0–17.0)
MCH: 32.3 pg (ref 26.0–34.0)
MCHC: 32.9 g/dL (ref 30.0–36.0)
MCV: 98.4 fL (ref 78.0–100.0)
Platelets: 199 10*3/uL (ref 150–400)
RBC: 4.3 MIL/uL (ref 4.22–5.81)
RDW: 13.4 % (ref 11.5–15.5)
WBC: 10.5 10*3/uL (ref 4.0–10.5)

## 2016-04-05 LAB — BASIC METABOLIC PANEL
ANION GAP: 5 (ref 5–15)
BUN: 23 mg/dL — AB (ref 6–20)
CALCIUM: 8.2 mg/dL — AB (ref 8.9–10.3)
CO2: 28 mmol/L (ref 22–32)
Chloride: 108 mmol/L (ref 101–111)
Creatinine, Ser: 0.87 mg/dL (ref 0.61–1.24)
GFR calc Af Amer: >60 mL/min (ref >60)
Glucose, Bld: 130 mg/dL — ABNORMAL HIGH (ref 65–99)
POTASSIUM: 4 mmol/L (ref 3.5–5.1)
SODIUM: 141 mmol/L (ref 135–145)

## 2016-04-05 MED ORDER — ENOXAPARIN SODIUM 40 MG/0.4ML ~~LOC~~ SOLN
40.0000 mg | SUBCUTANEOUS | Status: DC
  Administered 2016-04-05 – 2016-04-07 (×3): 40 mg via SUBCUTANEOUS
  Filled 2016-04-05 (×3): qty 0.4

## 2016-04-05 MED ORDER — HYDROMORPHONE HCL 1 MG/ML IJ SOLN
1.0000 mg | INTRAMUSCULAR | Status: AC | PRN
  Administered 2016-04-05 – 2016-04-06 (×4): 1 mg via INTRAVENOUS
  Filled 2016-04-05 (×4): qty 1

## 2016-04-05 MED ORDER — ONDANSETRON HCL 4 MG/2ML IJ SOLN: 4.0000 mg | Freq: Three times a day (TID) | INTRAMUSCULAR | Status: DC | PRN

## 2016-04-05 MED ORDER — SODIUM CHLORIDE 0.9 % IV SOLN
INTRAVENOUS | Status: AC
  Administered 2016-04-05: 02:00:00 via INTRAVENOUS

## 2016-04-05 MED ORDER — HYDROMORPHONE HCL 1 MG/ML IJ SOLN
1.0000 mg | INTRAMUSCULAR | Status: DC | PRN
  Administered 2016-04-05: 1 mg via INTRAVENOUS
  Filled 2016-04-05: qty 1

## 2016-04-05 MED ORDER — HYDROMORPHONE HCL 1 MG/ML IJ SOLN
1.0000 mg | INTRAMUSCULAR | Status: AC | PRN
  Administered 2016-04-05 (×2): 1 mg via INTRAVENOUS
  Filled 2016-04-05 (×2): qty 1

## 2016-04-05 MED ORDER — ONDANSETRON HCL 4 MG/2ML IJ SOLN: 4.0000 mg | Freq: Four times a day (QID) | INTRAMUSCULAR | Status: AC | PRN

## 2016-04-05 MED ORDER — DIATRIZOATE MEGLUMINE & SODIUM 66-10 % PO SOLN
90.0000 mL | Freq: Once | ORAL | Status: AC
  Administered 2016-04-05: 90 mL via NASOGASTRIC
  Filled 2016-04-05 (×4): qty 90

## 2016-04-05 MED ORDER — FAMOTIDINE IN NACL 20-0.9 MG/50ML-% IV SOLN
20.0000 mg | Freq: Two times a day (BID) | INTRAVENOUS | Status: DC
  Administered 2016-04-05 – 2016-04-07 (×7): 20 mg via INTRAVENOUS
  Filled 2016-04-05 (×8): qty 50

## 2016-04-05 MED ORDER — POTASSIUM CHLORIDE IN NACL 20-0.9 MEQ/L-% IV SOLN
INTRAVENOUS | Status: DC
  Administered 2016-04-05 – 2016-04-07 (×6): via INTRAVENOUS
  Administered 2016-04-08: 1000 mL via INTRAVENOUS
  Filled 2016-04-05 (×10): qty 1000

## 2016-04-05 NOTE — ED Notes (Signed)
patient fully dressed and asking to leave via POV  - the patient would prefer to take himself over to [place] to be admitted. Patient made aware that he needs an NG tube, so returns to room. Patient is comfortable and without any N/V/D  -  Discussed with MD  - patient will transfer via POV

## 2016-04-05 NOTE — Progress Notes (Signed)
Call and spoke with Dr Johnathan Hausen advising that pt complain of abdominal pain and Dilaudid IV prn order has expired. Verbal order received from Dr Hassell Done to reorder Dilaudid prn order.

## 2016-04-05 NOTE — H&P (Signed)
Justin [name] is an 60 y.o. male.    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 Also history of multiple inguinal hernia repairs.He presented to the [place] emergency department with 24 hours of progressive abdominal pain. He describes constant  Aching or pressure like low mid abdominal pain This was associated  With nausea and vomiting. He typically has chronic diarrhea and has had minimal bowel movements since onset of the symptoms. No fever  Or chills. No history of previous bowel obstructions.  Past Medical History  Diagnosis Date  . Colon cancer [facility]) 2003    specialist due for repeat colonoscopy in 2012  . Anxiety     takes approximately 10 diazepam/month-contracts to no more than that amount  . Depression   . Hyperlipidemia   . BPH (benign prostatic hypertrophy)     sees urology  . Overactive bladder   . Multiple fractures 08/2008    patient fell off a rood about 13 feet onto concrete-multiple fractures including vertrebral  . History of kidney stones   . Insomnia     Past Surgical History  Procedure Laterality Date  . Hip fracture surgery    . Elbow surgery    . Hernia repair    . Colectomy  02/2002    sigmoid colectomy    Family History  Problem Relation Age of Onset  . Coronary artery disease Father   . Other Neg Hx     no FH od colon cancer   Social History:  reports that he has quit smoking. He has never used smokeless tobacco. He reports that he does not drink alcohol or use illicit drugs.  Allergies: No Known Allergies  Medications Prior to Admission  Medication Sig Dispense Refill  . diazepam (VALIUM) 5 MG tablet TAKE 1 TABLET BY MOUTH AT BEDTIME (Patient taking differently: TAKE 5 MG BY MOUTH AT BEDTIME  AS NEEDED FOR SLEEP) 30 tablet 0  . escitalopram (LEXAPRO) 20 MG tablet TAKE 1 TABLET (20 MG TOTAL) BY  MOUTH DAILY. 90 tablet 1  . gabapentin (NEURONTIN) 300 MG capsule TAKE 1 CAPSULE (300 MG TOTAL) BY MOUTH 2 (TWO) TIMES DAILY. 60 capsule 1  . HYDROcodone-acetaminophen (NORCO/VICODIN) 5-325 MG tablet Take 1-2 tablets by mouth every 4 (four) hours as needed. (Patient taking differently: Take 1-2 tablets by mouth every 4 (four) hours as needed for moderate pain or severe pain. ) 15 tablet 0  . traZODone (DESYREL) 150 MG tablet TAKE 1 & 1/2 TABLETS BY MOUTH AT BEDTIME (Patient taking differently: TAKE 225 MG BY MOUTH AT BEDTIME) 135 tablet 1  . sildenafil (REVATIO) 20 MG tablet 1-2 tabs by mouth once daily as needed 50 tablet 1    Results for orders placed or performed during the hospital encounter of 04/04/16 (from the past 48 hour(s))  Lipase, blood     Status: None   Collection Time: 04/04/16  8:32 PM  Result Value Ref Range   Lipase 14 11 - 51 U/L  Comprehensive metabolic panel     Status: Abnormal   Collection Time: 04/04/16  8:32 PM  Result Value Ref Range   Sodium 139 135 - 145 mmol/L   Potassium 4.0 3.5 - 5.1 mmol/L   Chloride 100 (L) 101 - 111 mmol/L   CO2 25 22 - 32 mmol/L   Glucose, Bld 195 (H) 65 - 99 mg/dL   BUN  22 (H) 6 - 20 mg/dL   Creatinine, Ser 1.07 0.61 - 1.24 mg/dL   Calcium 10.4 (H) 8.9 - 10.3 mg/dL   Total Protein 8.6 (H) 6.5 - 8.1 g/dL   Albumin 4.9 3.5 - 5.0 g/dL   AST 30 15 - 41 U/L   ALT 20 17 - 63 U/L   Alkaline Phosphatase 165 (H) 38 - 126 U/L   Total Bilirubin 1.3 (H) 0.3 - 1.2 mg/dL   GFR calc non Af Amer >60 >60 mL/min   GFR calc Af Amer >60 >60 mL/min    Comment: (NOTE) The eGFR has been calculated using the CKD EPI equation. This calculation has not been validated in all clinical situations. eGFR's persistently <60 mL/min signify possible Chronic Kidney Disease.    Anion gap 14 5 - 15  CBC     Status: Abnormal   Collection Time: 04/04/16  8:32 PM  Result Value Ref Range   WBC 18.4 (H) 4.0 - 10.5 K/uL   RBC 5.40 4.22 - 5.81 MIL/uL   Hemoglobin  17.7 (H) 13.0 - 17.0 g/dL   HCT 52.4 (H) 39.0 - 52.0 %   MCV 97.0 78.0 - 100.0 fL   MCH 32.8 26.0 - 34.0 pg   MCHC 33.8 30.0 - 36.0 g/dL   RDW 13.1 11.5 - 15.5 %   Platelets 277 150 - 400 K/uL  Urinalysis, Routine w reflex microscopic     Status: Abnormal   Collection Time: 04/04/16  8:44 PM  Result Value Ref Range   Color, Urine AMBER (A) YELLOW    Comment: BIOCHEMICALS MAY BE AFFECTED BY COLOR   APPearance CLOUDY (A) CLEAR   Specific Gravity, Urine 1.026 1.005 - 1.030   pH 7.0 5.0 - 8.0   Glucose, UA NEGATIVE NEGATIVE mg/dL   Hgb urine dipstick NEGATIVE NEGATIVE   Bilirubin Urine SMALL (A) NEGATIVE   Ketones, ur >80 (A) NEGATIVE mg/dL   Protein, ur 30 (A) NEGATIVE mg/dL   Nitrite NEGATIVE NEGATIVE   Leukocytes, UA SMALL (A) NEGATIVE  Urine microscopic-add on     Status: Abnormal   Collection Time: 04/04/16  8:44 PM  Result Value Ref Range   Squamous Epithelial / LPF 0-5 (A) NONE SEEN   WBC, UA 0-5 0 - 5 WBC/hpf   RBC / HPF 0-5 0 - 5 RBC/hpf   Bacteria, UA FEW (A) NONE SEEN   Urine-Other MUCOUS PRESENT    Ct Abdomen Pelvis W Contrast  04/04/2016  CLINICAL DATA:  Left lower abdominal pain. Pain onset earlier today. Vomiting. EXAM: CT ABDOMEN AND PELVIS WITH CONTRAST TECHNIQUE: Multidetector CT imaging of the abdomen and pelvis was performed using the standard protocol following bolus administration of intravenous contrast. CONTRAST:  186m ISOVUE-300 IOPAMIDOL (ISOVUE-300) INJECTION 61% COMPARISON:  CT 12/26/2015 FINDINGS: Lower chest:  Suspected emphysema.  Dependent atelectasis. Liver: Scattered small hepatic hypodensities, largest measuring 10 mm, unchanged from prior exam. Some of the smaller lesions on prior exam are not well visualized currently. No new hepatic lesion. Hepatobiliary: Depending calcified gallstone. No pericholecystic inflammation. No biliary dilatation. Pancreas: No ductal dilatation or inflammation. Spleen: Mildly enlarged measuring 13.5 x 13.4 x 4.2 cm,  unchanged. No focal lesion. Adrenal glands: No nodule. Kidneys: Symmetric renal enhancement and excretion. No hydronephrosis. Large cyst from the upper right kidney is unchanged. There are scattered nonobstructing stones in both kidneys. Stomach/Bowel: Stomach is distended with ingested contrast. Small bowel loops are fluid-filled and prominent. The distal most small bowel loops are decompressed. Discrete  transition point is not visualized, however suspected in the right pelvis, partially obscured by streak artifact from pelvis surgical hardware. No mesenteric edema. No pneumatosis. Liquid stool in the cecum and ascending colon. Formed stool in the transverse and descending colon. Enteric anastomosis in the rectosigmoid. Vascular/Lymphatic: No retroperitoneal or pelvic adenopathy. Abdominal aorta is normal in caliber. Atherosclerosis without aneurysm. Reproductive: Normal sized prostate gland. Bladder: Decompressed and not well evaluated. Other: No free air, free fluid, or intra-abdominal fluid collection. Post left inguinal hernia repair with tacks. Stable soft tissue density in the right inguinal canal. Musculoskeletal: There are no acute or suspicious osseous abnormalities. Unchanged L1 compression deformity. Remote posttraumatic and postsurgical change involving the right hemipelvis. IMPRESSION: 1. Findings consistent with partial small bowel obstruction, suspected transition point in the right pelvis partially obscured by streak artifact from surgical hardware. 2. Additional chronic findings are stable and unchanged from prior. Electronically Signed   By: Jeb Levering M.D.   On: 04/04/2016 23:04    Review of Systems  Constitutional: Negative for fever, chills and weight loss.  Respiratory: Negative.   Cardiovascular: Negative.   Gastrointestinal: Positive for nausea, vomiting, abdominal pain and constipation. Negative for blood in stool.  Psychiatric/Behavioral: The patient is nervous/anxious.      Blood pressure 144/81, pulse 86, temperature 98.3 F (36.8 C), temperature source Oral, resp. rate 20, height 5' 11"  (1.803 m), weight 77.111 kg (170 lb), SpO2 96 %. Physical Exam  General: Alert, well-developed Caucasian male, in no distress Skin: Warm and dry without rash or infection. HEENT: No palpable masses or thyromegaly. Sclera nonicteric. Pupils equal round and reactive.  Lymph nodes: No cervical, supraclavicular, or inguinal nodes palpable. Lungs: Breath sounds clear and equal without increased work of breathing Cardiovascular: Regular rate and rhythm without murmur. No JVD or edema. Peripheral pulses intact. Abdomen: Mild distention. Minimal lower and mid abdominal  Tenderness without guarding. No masses palpable. No organomegaly. Well-healed low midline and inguinalincisions.No palpable hernias. Extremities: No edema or joint swelling or deformity. No chronic venous stasis changes. Neurologic: Alert and fully oriented. Affect normal. No gross motor deficits.  Assessment/Plan Small bowel obstruction. Remote history of low anterior resection and radiation for rectosigmoid cancer. 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 Patient currently stable and improved with medications and IV fluids. NG tube will be placed and abdominal x-rays followed closely with small bowel obstruction protocol.  Edward Jolly, MD 04/05/2016, 3:07 AM

## 2016-04-05 NOTE — ED Notes (Signed)
Patient instructed to go directly to [facility]. The patient left with IV intact.  EDP aware

## 2016-04-05 NOTE — Progress Notes (Signed)
Patient ID: Justin [name], male   DOB: 03-05-1956, 60 y.o.   MRN: [id number] [facility] Progress Note:   * No surgery found *  Subjective: Mental status is clear;  NG in place Objective: Vital signs in last 24 hours: Temp:  [98.2 F (36.8 C)-98.6 F (37 C)] 98.2 F (36.8 C) (06/17 0545) Pulse Rate:  [60-86] 68 (06/17 0545) Resp:  [18-20] 18 (06/17 0545) BP: (132-146)/(72-100) 132/72 mmHg (06/17 0545) SpO2:  [96 %-100 %] 98 % (06/17 0545) Weight:  [77.111 kg (170 lb)] 77.111 kg (170 lb) (06/16 1951)  Intake/Output from previous day: 06/16 0701 - 06/17 0700 In: 657.5 [I.V.:607.5; IV Piggyback:50] Out: 1100 [Emesis/NG output:1100] Intake/Output this shift:    Physical Exam: Work of breathing is normal.  Patient has had BMs thus far after drinking contrast.  Xray pending this afternoon.    Lab Results:  Results for orders placed or performed during the hospital encounter of 04/04/16 (from the past 48 hour(s))  Lipase, blood     Status: None   Collection Time: 04/04/16  8:32 PM  Result Value Ref Range   Lipase 14 11 - 51 U/L  Comprehensive metabolic panel     Status: Abnormal   Collection Time: 04/04/16  8:32 PM  Result Value Ref Range   Sodium 139 135 - 145 mmol/L   Potassium 4.0 3.5 - 5.1 mmol/L   Chloride 100 (L) 101 - 111 mmol/L   CO2 25 22 - 32 mmol/L   Glucose, Bld 195 (H) 65 - 99 mg/dL   BUN 22 (H) 6 - 20 mg/dL   Creatinine, Ser 1.07 0.61 - 1.24 mg/dL   Calcium 10.4 (H) 8.9 - 10.3 mg/dL   Total Protein 8.6 (H) 6.5 - 8.1 g/dL   Albumin 4.9 3.5 - 5.0 g/dL   AST 30 15 - 41 U/L   ALT 20 17 - 63 U/L   Alkaline Phosphatase 165 (H) 38 - 126 U/L   Total Bilirubin 1.3 (H) 0.3 - 1.2 mg/dL   GFR calc non Af Amer >60 >60 mL/min   GFR calc Af Amer >60 >60 mL/min    Comment: (NOTE) The eGFR has been calculated using the CKD EPI equation. This calculation has not been validated in all clinical situations. eGFR's persistently <60 mL/min signify possible Chronic  Kidney Disease.    Anion gap 14 5 - 15  CBC     Status: Abnormal   Collection Time: 04/04/16  8:32 PM  Result Value Ref Range   WBC 18.4 (H) 4.0 - 10.5 K/uL   RBC 5.40 4.22 - 5.81 MIL/uL   Hemoglobin 17.7 (H) 13.0 - 17.0 g/dL   HCT 52.4 (H) 39.0 - 52.0 %   MCV 97.0 78.0 - 100.0 fL   MCH 32.8 26.0 - 34.0 pg   MCHC 33.8 30.0 - 36.0 g/dL   RDW 13.1 11.5 - 15.5 %   Platelets 277 150 - 400 K/uL  Urinalysis, Routine w reflex microscopic     Status: Abnormal   Collection Time: 04/04/16  8:44 PM  Result Value Ref Range   Color, Urine AMBER (A) YELLOW    Comment: BIOCHEMICALS MAY BE AFFECTED BY COLOR   APPearance CLOUDY (A) CLEAR   Specific Gravity, Urine 1.026 1.005 - 1.030   pH 7.0 5.0 - 8.0   Glucose, UA NEGATIVE NEGATIVE mg/dL   Hgb urine dipstick NEGATIVE NEGATIVE   Bilirubin Urine SMALL (A) NEGATIVE   Ketones, ur >80 (A) NEGATIVE mg/dL  Protein, ur 30 (A) NEGATIVE mg/dL   Nitrite NEGATIVE NEGATIVE   Leukocytes, UA SMALL (A) NEGATIVE  Urine microscopic-add on     Status: Abnormal   Collection Time: 04/04/16  8:44 PM  Result Value Ref Range   Squamous Epithelial / LPF 0-5 (A) NONE SEEN   WBC, UA 0-5 0 - 5 WBC/hpf   RBC / HPF 0-5 0 - 5 RBC/hpf   Bacteria, UA FEW (A) NONE SEEN   Urine-Other MUCOUS PRESENT   Basic metabolic panel     Status: Abnormal   Collection Time: 04/05/16  4:21 AM  Result Value Ref Range   Sodium 141 135 - 145 mmol/L   Potassium 4.0 3.5 - 5.1 mmol/L   Chloride 108 101 - 111 mmol/L   CO2 28 22 - 32 mmol/L   Glucose, Bld 130 (H) 65 - 99 mg/dL   BUN 23 (H) 6 - 20 mg/dL   Creatinine, Ser 0.87 0.61 - 1.24 mg/dL   Calcium 8.2 (L) 8.9 - 10.3 mg/dL   GFR calc non Af Amer >60 >60 mL/min   GFR calc Af Amer >60 >60 mL/min    Comment: (NOTE) The eGFR has been calculated using the CKD EPI equation. This calculation has not been validated in all clinical situations. eGFR's persistently <60 mL/min signify possible Chronic Kidney Disease.    Anion gap 5 5 -  15  CBC     Status: None   Collection Time: 04/05/16  4:21 AM  Result Value Ref Range   WBC 10.5 4.0 - 10.5 K/uL   RBC 4.30 4.22 - 5.81 MIL/uL   Hemoglobin 13.9 13.0 - 17.0 g/dL   HCT 42.3 39.0 - 52.0 %   MCV 98.4 78.0 - 100.0 fL   MCH 32.3 26.0 - 34.0 pg   MCHC 32.9 30.0 - 36.0 g/dL   RDW 13.4 11.5 - 15.5 %   Platelets 199 150 - 400 K/uL    Radiology/Results: Ct Abdomen Pelvis W Contrast  04/04/2016  CLINICAL DATA:  Left lower abdominal pain. Pain onset earlier today. Vomiting. EXAM: CT ABDOMEN AND PELVIS WITH CONTRAST TECHNIQUE: Multidetector CT imaging of the abdomen and pelvis was performed using the standard protocol following bolus administration of intravenous contrast. CONTRAST:  15m ISOVUE-300 IOPAMIDOL (ISOVUE-300) INJECTION 61% COMPARISON:  CT 12/26/2015 FINDINGS: Lower chest:  Suspected emphysema.  Dependent atelectasis. Liver: Scattered small hepatic hypodensities, largest measuring 10 mm, unchanged from prior exam. Some of the smaller lesions on prior exam are not well visualized currently. No new hepatic lesion. Hepatobiliary: Depending calcified gallstone. No pericholecystic inflammation. No biliary dilatation. Pancreas: No ductal dilatation or inflammation. Spleen: Mildly enlarged measuring 13.5 x 13.4 x 4.2 cm, unchanged. No focal lesion. Adrenal glands: No nodule. Kidneys: Symmetric renal enhancement and excretion. No hydronephrosis. Large cyst from the upper right kidney is unchanged. There are scattered nonobstructing stones in both kidneys. Stomach/Bowel: Stomach is distended with ingested contrast. Small bowel loops are fluid-filled and prominent. The distal most small bowel loops are decompressed. Discrete transition point is not visualized, however suspected in the right pelvis, partially obscured by streak artifact from pelvis surgical hardware. No mesenteric edema. No pneumatosis. Liquid stool in the cecum and ascending colon. Formed stool in the transverse and  descending colon. Enteric anastomosis in the rectosigmoid. Vascular/Lymphatic: No retroperitoneal or pelvic adenopathy. Abdominal aorta is normal in caliber. Atherosclerosis without aneurysm. Reproductive: Normal sized prostate gland. Bladder: Decompressed and not well evaluated. Other: No free air, free fluid, or intra-abdominal fluid collection.  Post left inguinal hernia repair with tacks. Stable soft tissue density in the right inguinal canal. Musculoskeletal: There are no acute or suspicious osseous abnormalities. Unchanged L1 compression deformity. Remote posttraumatic and postsurgical change involving the right hemipelvis. IMPRESSION: 1. Findings consistent with partial small bowel obstruction, suspected transition point in the right pelvis partially obscured by streak artifact from surgical hardware. 2. Additional chronic findings are stable and unchanged from prior. Electronically Signed   By: Jeb Levering M.D.   On: 04/04/2016 23:04   Dg Abd Portable 1v-small Bowel Protocol-position Verification  04/05/2016  CLINICAL DATA:  NG tube placement.  SBO protocol. EXAM: PORTABLE ABDOMEN - 1 VIEW COMPARISON:  CT abdomen and pelvis 04/04/2016 FINDINGS: Enteric tube tip is in the left upper quadrant consistent with location in the upper stomach. Gaseous distention of mid abdominal small bowel consistent with small bowel obstruction or possibly enteritis. Scattered gas and stool through the colon without distention. Residual contrast material in the bladder. Postoperative changes with old plate and screw fixations of the right hemipelvis. Previous left hernia repair. Degenerative changes in the spine. IMPRESSION: Enteric tube tip is in the left upper quadrant consistent with location in the upper stomach. Electronically Signed   By: Lucienne Capers M.D.   On: 04/05/2016 06:07    Anti-infectives: Anti-infectives    None      Assessment/Plan: Problem List: Patient Active Problem List   Diagnosis Date  Noted  . Small bowel obstruction ([place]) 04/04/2016  . Seborrheic dermatitis of scalp 05/09/2014  . Routine general medical examination at a health care facility 10/04/2013  . Skin lesion of hand 10/04/2013  . IBS (irritable bowel syndrome) 12/20/2012  . Chronic pain 08/25/2012  . Chronic foot pain 03/10/2012  . Anxiety 07/05/2011  . BACK PAIN, CHRONIC 10/29/2009  . INSOMNIA, CHRONIC 04/27/2009  . ADENOCARCINOMA, COLON 01/04/2009  . GLUCOSE INTOLERANCE 01/04/2009  . HYPERLIPIDEMIA 11/13/2008  . Depression with anxiety 11/13/2008  . OVERACTIVE BLADDER 11/13/2008  . BENIGN PROSTATIC HYPERTROPHY 11/13/2008    Doing well with evidence of opening of partial SBO * No surgery found *    LOS: 1 day   Matt B. Hassell Done, MD, [facility] [phone] beeper [phone]  04/05/2016 8:35 AM

## 2016-04-06 MED ORDER — HYDROMORPHONE HCL 1 MG/ML IJ SOLN
1.0000 mg | INTRAMUSCULAR | Status: DC | PRN
  Administered 2016-04-06 – 2016-04-07 (×10): 1 mg via INTRAVENOUS
  Filled 2016-04-06 (×10): qty 1

## 2016-04-06 NOTE — Progress Notes (Signed)
Pt asked me if he could take a shower, I told him he would need a MD order.

## 2016-04-06 NOTE — Progress Notes (Signed)
Dr. Excell Seltzer aware via phone pt needing pain med yet all prns discontinued early am. See new order received.

## 2016-04-06 NOTE — Progress Notes (Signed)
Pt reported that during the night he has had at least 5 diarrhea stools.  Pt reported he was constipated before he came in to the hospital. Will continue to monitor.

## 2016-04-06 NOTE — Progress Notes (Signed)
Patient ID: Justin [name], male   DOB: April 18, 1956, 60 y.o.   MRN: [id number]   [facility] Progress Note:   * No surgery found *  Subjective: Mental status is clear.  Had flatus and BM Objective: Vital signs in last 24 hours: Temp:  [97.5 F (36.4 C)-98.8 F (37.1 C)] 97.9 F (36.6 C) (06/18 0500) Pulse Rate:  [56-73] 71 (06/18 0500) Resp:  [16-18] 16 (06/18 0500) BP: (125-147)/(61-80) 128/61 mmHg (06/18 0500) SpO2:  [94 %-98 %] 98 % (06/18 0500)  Intake/Output from previous day: 06/17 0701 - 06/18 0700 In: 1600 [I.V.:1500; IV Piggyback:100] Out: 1129 [Urine:603; Emesis/NG output:526] Intake/Output this shift:    Physical Exam: Work of breathing is normal.  Abdomen is nontender  Lab Results:  Results for orders placed or performed during the hospital encounter of 04/04/16 (from the past 48 hour(s))  Lipase, blood     Status: None   Collection Time: 04/04/16  8:32 PM  Result Value Ref Range   Lipase 14 11 - 51 U/L  Comprehensive metabolic panel     Status: Abnormal   Collection Time: 04/04/16  8:32 PM  Result Value Ref Range   Sodium 139 135 - 145 mmol/L   Potassium 4.0 3.5 - 5.1 mmol/L   Chloride 100 (L) 101 - 111 mmol/L   CO2 25 22 - 32 mmol/L   Glucose, Bld 195 (H) 65 - 99 mg/dL   BUN 22 (H) 6 - 20 mg/dL   Creatinine, Ser 1.07 0.61 - 1.24 mg/dL   Calcium 10.4 (H) 8.9 - 10.3 mg/dL   Total Protein 8.6 (H) 6.5 - 8.1 g/dL   Albumin 4.9 3.5 - 5.0 g/dL   AST 30 15 - 41 U/L   ALT 20 17 - 63 U/L   Alkaline Phosphatase 165 (H) 38 - 126 U/L   Total Bilirubin 1.3 (H) 0.3 - 1.2 mg/dL   GFR calc non Af Amer >60 >60 mL/min   GFR calc Af Amer >60 >60 mL/min    Comment: (NOTE) The eGFR has been calculated using the CKD EPI equation. This calculation has not been validated in all clinical situations. eGFR's persistently <60 mL/min signify possible Chronic Kidney Disease.    Anion gap 14 5 - 15  CBC     Status: Abnormal   Collection Time: 04/04/16  8:32 PM   Result Value Ref Range   WBC 18.4 (H) 4.0 - 10.5 K/uL   RBC 5.40 4.22 - 5.81 MIL/uL   Hemoglobin 17.7 (H) 13.0 - 17.0 g/dL   HCT 52.4 (H) 39.0 - 52.0 %   MCV 97.0 78.0 - 100.0 fL   MCH 32.8 26.0 - 34.0 pg   MCHC 33.8 30.0 - 36.0 g/dL   RDW 13.1 11.5 - 15.5 %   Platelets 277 150 - 400 K/uL  Urinalysis, Routine w reflex microscopic     Status: Abnormal   Collection Time: 04/04/16  8:44 PM  Result Value Ref Range   Color, Urine AMBER (A) YELLOW    Comment: BIOCHEMICALS MAY BE AFFECTED BY COLOR   APPearance CLOUDY (A) CLEAR   Specific Gravity, Urine 1.026 1.005 - 1.030   pH 7.0 5.0 - 8.0   Glucose, UA NEGATIVE NEGATIVE mg/dL   Hgb urine dipstick NEGATIVE NEGATIVE   Bilirubin Urine SMALL (A) NEGATIVE   Ketones, ur >80 (A) NEGATIVE mg/dL   Protein, ur 30 (A) NEGATIVE mg/dL   Nitrite NEGATIVE NEGATIVE   Leukocytes, UA SMALL (A) NEGATIVE  Urine  microscopic-add on     Status: Abnormal   Collection Time: 04/04/16  8:44 PM  Result Value Ref Range   Squamous Epithelial / LPF 0-5 (A) NONE SEEN   WBC, UA 0-5 0 - 5 WBC/hpf   RBC / HPF 0-5 0 - 5 RBC/hpf   Bacteria, UA FEW (A) NONE SEEN   Urine-Other MUCOUS PRESENT   Basic metabolic panel     Status: Abnormal   Collection Time: 04/05/16  4:21 AM  Result Value Ref Range   Sodium 141 135 - 145 mmol/L   Potassium 4.0 3.5 - 5.1 mmol/L   Chloride 108 101 - 111 mmol/L   CO2 28 22 - 32 mmol/L   Glucose, Bld 130 (H) 65 - 99 mg/dL   BUN 23 (H) 6 - 20 mg/dL   Creatinine, Ser 0.87 0.61 - 1.24 mg/dL   Calcium 8.2 (L) 8.9 - 10.3 mg/dL   GFR calc non Af Amer >60 >60 mL/min   GFR calc Af Amer >60 >60 mL/min    Comment: (NOTE) The eGFR has been calculated using the CKD EPI equation. This calculation has not been validated in all clinical situations. eGFR's persistently <60 mL/min signify possible Chronic Kidney Disease.    Anion gap 5 5 - 15  CBC     Status: None   Collection Time: 04/05/16  4:21 AM  Result Value Ref Range   WBC 10.5 4.0 -  10.5 K/uL   RBC 4.30 4.22 - 5.81 MIL/uL   Hemoglobin 13.9 13.0 - 17.0 g/dL   HCT 42.3 39.0 - 52.0 %   MCV 98.4 78.0 - 100.0 fL   MCH 32.3 26.0 - 34.0 pg   MCHC 32.9 30.0 - 36.0 g/dL   RDW 13.4 11.5 - 15.5 %   Platelets 199 150 - 400 K/uL    Radiology/Results: Ct Abdomen Pelvis W Contrast  04/04/2016  CLINICAL DATA:  Left lower abdominal pain. Pain onset earlier today. Vomiting. EXAM: CT ABDOMEN AND PELVIS WITH CONTRAST TECHNIQUE: Multidetector CT imaging of the abdomen and pelvis was performed using the standard protocol following bolus administration of intravenous contrast. CONTRAST:  191m ISOVUE-300 IOPAMIDOL (ISOVUE-300) INJECTION 61% COMPARISON:  CT 12/26/2015 FINDINGS: Lower chest:  Suspected emphysema.  Dependent atelectasis. Liver: Scattered small hepatic hypodensities, largest measuring 10 mm, unchanged from prior exam. Some of the smaller lesions on prior exam are not well visualized currently. No new hepatic lesion. Hepatobiliary: Depending calcified gallstone. No pericholecystic inflammation. No biliary dilatation. Pancreas: No ductal dilatation or inflammation. Spleen: Mildly enlarged measuring 13.5 x 13.4 x 4.2 cm, unchanged. No focal lesion. Adrenal glands: No nodule. Kidneys: Symmetric renal enhancement and excretion. No hydronephrosis. Large cyst from the upper right kidney is unchanged. There are scattered nonobstructing stones in both kidneys. Stomach/Bowel: Stomach is distended with ingested contrast. Small bowel loops are fluid-filled and prominent. The distal most small bowel loops are decompressed. Discrete transition point is not visualized, however suspected in the right pelvis, partially obscured by streak artifact from pelvis surgical hardware. No mesenteric edema. No pneumatosis. Liquid stool in the cecum and ascending colon. Formed stool in the transverse and descending colon. Enteric anastomosis in the rectosigmoid. Vascular/Lymphatic: No retroperitoneal or pelvic  adenopathy. Abdominal aorta is normal in caliber. Atherosclerosis without aneurysm. Reproductive: Normal sized prostate gland. Bladder: Decompressed and not well evaluated. Other: No free air, free fluid, or intra-abdominal fluid collection. Post left inguinal hernia repair with tacks. Stable soft tissue density in the right inguinal canal. Musculoskeletal: There are no  acute or suspicious osseous abnormalities. Unchanged L1 compression deformity. Remote posttraumatic and postsurgical change involving the right hemipelvis. IMPRESSION: 1. Findings consistent with partial small bowel obstruction, suspected transition point in the right pelvis partially obscured by streak artifact from surgical hardware. 2. Additional chronic findings are stable and unchanged from prior. Electronically Signed   By: Jeb Levering M.D.   On: 04/04/2016 23:04   Dg Abd Portable 1v-small Bowel Obstruction Protocol-initial, 8 Hr Delay  04/05/2016  CLINICAL DATA:  History of colon cancer 2003. History of small-bowel obstruction from 04/04/2016. EXAM: PORTABLE ABDOMEN - 1 VIEW COMPARISON:  Abdominal radiograph 04/05/2016 FINDINGS: The bowel gas pattern is nonobstructive. Residual contrast is seen throughout the colon intermixed with formed stool matter and normal amount of gas. No radio-opaque calculi or other significant radiographic abnormality are seen. Enteric catheter overlies expected location of proximal stomach. Postsurgical changes from right pelvic fractures repairs and abdominal wall hernia repair are noted. IMPRESSION: Nonobstructive bowel gas pattern with residual oral contrast throughout the colon. Electronically Signed   By: Fidela Salisbury M.D.   On: 04/05/2016 16:02   Dg Abd Portable 1v-small Bowel Protocol-position Verification  04/05/2016  CLINICAL DATA:  NG tube placement.  SBO protocol. EXAM: PORTABLE ABDOMEN - 1 VIEW COMPARISON:  CT abdomen and pelvis 04/04/2016 FINDINGS: Enteric tube tip is in the left upper  quadrant consistent with location in the upper stomach. Gaseous distention of mid abdominal small bowel consistent with small bowel obstruction or possibly enteritis. Scattered gas and stool through the colon without distention. Residual contrast material in the bladder. Postoperative changes with old plate and screw fixations of the right hemipelvis. Previous left hernia repair. Degenerative changes in the spine. IMPRESSION: Enteric tube tip is in the left upper quadrant consistent with location in the upper stomach. Electronically Signed   By: Lucienne Capers M.D.   On: 04/05/2016 06:07    Anti-infectives: Anti-infectives    None      Assessment/Plan: Problem List: Patient Active Problem List   Diagnosis Date Noted  . Small bowel obstruction ([place]) 04/04/2016  . Seborrheic dermatitis of scalp 05/09/2014  . Routine general medical examination at a health care facility 10/04/2013  . Skin lesion of hand 10/04/2013  . IBS (irritable bowel syndrome) 12/20/2012  . Chronic pain 08/25/2012  . Chronic foot pain 03/10/2012  . Anxiety 07/05/2011  . BACK PAIN, CHRONIC 10/29/2009  . INSOMNIA, CHRONIC 04/27/2009  . ADENOCARCINOMA, COLON 01/04/2009  . GLUCOSE INTOLERANCE 01/04/2009  . HYPERLIPIDEMIA 11/13/2008  . Depression with anxiety 11/13/2008  . OVERACTIVE BLADDER 11/13/2008  . BENIGN PROSTATIC HYPERTROPHY 11/13/2008    Discontinue NG and start clear liquids * No surgery found *    LOS: 2 days   Matt B. Hassell Done, MD, [facility] [phone] beeper [phone]  04/06/2016 8:44 AM

## 2016-04-07 LAB — CBC
HEMATOCRIT: 43.7 % (ref 39.0–52.0)
HEMOGLOBIN: 14.1 g/dL (ref 13.0–17.0)
MCH: 32 pg (ref 26.0–34.0)
MCHC: 32.3 g/dL (ref 30.0–36.0)
MCV: 99.3 fL (ref 78.0–100.0)
Platelets: 171 10*3/uL (ref 150–400)
RBC: 4.4 MIL/uL (ref 4.22–5.81)
RDW: 13.4 % (ref 11.5–15.5)
WBC: 5.2 10*3/uL (ref 4.0–10.5)

## 2016-04-07 LAB — BASIC METABOLIC PANEL
ANION GAP: 6 (ref 5–15)
BUN: 9 mg/dL (ref 6–20)
CHLORIDE: 107 mmol/L (ref 101–111)
CO2: 27 mmol/L (ref 22–32)
Calcium: 8.6 mg/dL — ABNORMAL LOW (ref 8.9–10.3)
Creatinine, Ser: 0.89 mg/dL (ref 0.61–1.24)
GFR calc non Af Amer: >60 mL/min (ref >60)
Glucose, Bld: 102 mg/dL — ABNORMAL HIGH (ref 65–99)
POTASSIUM: 4 mmol/L (ref 3.5–5.1)
Sodium: 140 mmol/L (ref 135–145)

## 2016-04-07 MED ORDER — GABAPENTIN 300 MG PO CAPS
300.0000 mg | ORAL_CAPSULE | Freq: Two times a day (BID) | ORAL | Status: DC
  Administered 2016-04-07 (×2): 300 mg via ORAL
  Filled 2016-04-07 (×2): qty 1

## 2016-04-07 MED ORDER — OXYCODONE-ACETAMINOPHEN 5-325 MG PO TABS
1.0000 | ORAL_TABLET | ORAL | Status: DC | PRN
  Administered 2016-04-07: 2 via ORAL
  Administered 2016-04-08: 1 via ORAL
  Filled 2016-04-07: qty 1
  Filled 2016-04-07: qty 2

## 2016-04-07 MED ORDER — DIAZEPAM 5 MG PO TABS
5.0000 mg | ORAL_TABLET | Freq: Every day | ORAL | Status: DC
  Administered 2016-04-07: 5 mg via ORAL
  Filled 2016-04-07: qty 1

## 2016-04-07 MED ORDER — ESCITALOPRAM OXALATE 20 MG PO TABS
20.0000 mg | ORAL_TABLET | Freq: Every day | ORAL | Status: DC
  Administered 2016-04-07: 20 mg via ORAL
  Filled 2016-04-07: qty 1

## 2016-04-07 NOTE — Progress Notes (Signed)
Subjective: He looks great, BM are just liquid so far, but had several last PM.    Objective: Vital signs in last 24 hours: Temp:  [98.3 F (36.8 C)-98.7 F (37.1 C)] 98.7 F (37.1 C) (06/18 2145) Pulse Rate:  [55-62] 62 (06/19 0500) Resp:  [16] 16 (06/19 0500) BP: (113-133)/(60-75) 114/68 mmHg (06/19 0500) SpO2:  [95 %-98 %] 95 % (06/19 0500) Last BM Date: 04/05/16 PO 470 Urine 600 BM x 1 recorded Afebrile, VSS  NO labs for 48 hours abd film SB Protocol 04/05/16:  Contrast in colon Intake/Output from previous day: 06/18 0701 - 06/19 0700 In: 3470 [P.O.:470; I.V.:3000] Out: 600 [Urine:600] Intake/Output this shift:    General appearance: alert, cooperative and no distress Resp: clear to auscultation bilaterally GI: soft, non-tender; bowel sounds normal; no masses,  no organomegaly  Lab Results:   Recent Labs  04/04/16 2032 04/05/16 0421  WBC 18.4* 10.5  HGB 17.7* 13.9  HCT 52.4* 42.3  PLT 277 199    BMET  Recent Labs  04/04/16 2032 04/05/16 0421  NA 139 141  K 4.0 4.0  CL 100* 108  CO2 25 28  GLUCOSE 195* 130*  BUN 22* 23*  CREATININE 1.07 0.87  CALCIUM 10.4* 8.2*   PT/INR No results for input(s): LABPROT, INR in the last 72 hours.   Recent Labs Lab 04/04/16 2032  AST 30  ALT 20  ALKPHOS 165*  BILITOT 1.3*  PROT 8.6*  ALBUMIN 4.9     Lipase     Component Value Date/Time   LIPASE 14 04/04/2016 2032     Studies/Results: Dg Abd Portable 1v-small Bowel Obstruction Protocol-initial, 8 Hr Delay  04/05/2016  CLINICAL DATA:  History of colon cancer 2003. History of small-bowel obstruction from 04/04/2016. EXAM: PORTABLE ABDOMEN - 1 VIEW COMPARISON:  Abdominal radiograph 04/05/2016 FINDINGS: The bowel gas pattern is nonobstructive. Residual contrast is seen throughout the colon intermixed with formed stool matter and normal amount of gas. No radio-opaque calculi or other significant radiographic abnormality are seen. Enteric catheter overlies  expected location of proximal stomach. Postsurgical changes from right pelvic fractures repairs and abdominal wall hernia repair are noted. IMPRESSION: Nonobstructive bowel gas pattern with residual oral contrast throughout the colon. Electronically Signed   By: Fidela Salisbury M.D.   On: 04/05/2016 16:02   Prior to Admission medications   Medication Sig Start Date End Date Taking? Authorizing Provider  diazepam (VALIUM) 5 MG tablet TAKE 1 TABLET BY MOUTH AT BEDTIME Patient taking differently: TAKE 5 MG BY MOUTH AT BEDTIME  AS NEEDED FOR SLEEP 03/18/16  Yes Debbrah Alar, NP  escitalopram (LEXAPRO) 20 MG tablet TAKE 1 TABLET (20 MG TOTAL) BY MOUTH DAILY. 11/09/15  Yes Debbrah Alar, NP  gabapentin (NEURONTIN) 300 MG capsule TAKE 1 CAPSULE (300 MG TOTAL) BY MOUTH 2 (TWO) TIMES DAILY. 11/26/15  Yes Debbrah Alar, NP  HYDROcodone-acetaminophen (NORCO/VICODIN) 5-325 MG tablet Take 1-2 tablets by mouth every 4 (four) hours as needed. Patient taking differently: Take 1-2 tablets by mouth every 4 (four) hours as needed for moderate pain or severe pain.  01/07/16  Yes Debbrah Alar, NP  traZODone (DESYREL) 150 MG tablet TAKE 1 & 1/2 TABLETS BY MOUTH AT BEDTIME Patient taking differently: TAKE 225 MG BY MOUTH AT BEDTIME 11/09/15  Yes Debbrah Alar, NP  sildenafil (REVATIO) 20 MG tablet 1-2 tabs by mouth once daily as needed 03/04/16   Debbrah Alar, NP     Medications: . enoxaparin (LOVENOX) injection  40 mg  Subcutaneous Q24H  . famotidine (PEPCID) IV  20 mg Intravenous Q12H   . 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  Restart home meds.         LOS: 3 days     Justin [name] 04/07/2016 [phone]

## 2016-04-07 NOTE — Discharge Summary (Signed)
Physician Discharge Summary  Patient ID: Justin [name] MRN: [id number] DOB/AGE: [birth year] 60 y.o.  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 Also history of multiple inguinal hernia repairs.He presented to the [place] emergency department with 24 hours of progressive abdominal pain. He describes constant Aching or pressure like low mid abdominal pain This was associated With nausea and vomiting. He typically has chronic diarrhea and has had minimal bowel movements since onset of the symptoms. No fever Or chills. No history of previous bowel obstructions.  Pt seen and admitted by Dr. Excell Seltzer, NG was placed, he was started on IV fluids.  Over the next 48 hours he improved and had a BM after drinking contrast.  NG was removed and and he was started on clears 04/06/16.  He is being advanced to a soft diet on 04/07/16 and tolerated it well.  He has 2 BM during the day 6/19.  He was ready for discharge the following AM.  He will resume his preadmission medicine as before.  I have not added anything new to his medications.     CBC Latest Ref Rng 04/07/2016 04/05/2016 04/04/2016  WBC 4.0 - 10.5 K/uL 5.2 10.5 18.4(H)  Hemoglobin 13.0 - 17.0 g/dL 14.1 13.9 17.7(H)  Hematocrit 39.0 - 52.0 % 43.7 42.3 52.4(H)  Platelets 150 - 400 K/uL 171 199 277    CMP Latest Ref Rng 04/07/2016 04/05/2016 04/04/2016  Glucose 65 - 99 mg/dL 102(H) 130(H) 195(H)  BUN 6 - 20 mg/dL 9 23(H) 22(H)  Creatinine 0.61 - 1.24 mg/dL 0.89 0.87 1.07  Sodium  135 - 145 mmol/L 140 141 139  Potassium 3.5 - 5.1 mmol/L 4.0 4.0 4.0  Chloride 101 - 111 mmol/L 107 108 100(L)  CO2 22 - 32 mmol/L 27 28 25   Calcium 8.9 - 10.3 mg/dL 8.6(L) 8.2(L) 10.4(H)  Total Protein 6.5 - 8.1 g/dL - - 8.6(H)  Total Bilirubin 0.3 - 1.2 mg/dL - - 1.3(H)  Alkaline Phos 38 - 126 U/L - - 165(H)  AST 15 - 41 U/L - - 30  ALT 17 - 63 U/L - - 20     Disposition: 01-Home or Self Care     Medication List    TAKE these medications        diazepam 5 MG tablet  Commonly known as:  VALIUM  TAKE 1 TABLET BY MOUTH AT BEDTIME     escitalopram 20 MG tablet  Commonly known as:  LEXAPRO  TAKE 1 TABLET (20 MG TOTAL) BY MOUTH DAILY.     gabapentin 300 MG capsule  Commonly known as:  NEURONTIN  TAKE 1 CAPSULE (300 MG TOTAL) BY MOUTH 2 (TWO) TIMES DAILY.     HYDROcodone-acetaminophen 5-325 MG tablet  Commonly known as:  NORCO/VICODIN  Take 1-2 tablets by mouth every 4 (four) hours as needed.     sildenafil 20 MG tablet  Commonly known as:  REVATIO  1-2 tabs by mouth once daily as needed     traZODone 150 MG tablet  Commonly known as:  DESYREL  TAKE 1 & 1/2 TABLETS BY  MOUTH AT BEDTIME       Follow-up Information    Follow up with Nance Pear., NP.   Specialty:  Internal Medicine   Why:  Call for follow up.  You should stay on low fiber diet for at least 3-4 days and then resume normal diet.     Contact information:   [place] [address] [phone]       Follow up with [facility].   Specialty:  General Surgery   Why:  We took care of you during your hospital stay, call as needed.  There is nothing specific for you to see [place] for unless you have a new obstruction.   Contact information:   [place] [address] [phone]       Signed: Earnstine Regal 04/08/2016, 7:29 AM

## 2016-04-07 NOTE — Discharge Instructions (Signed)
Small Bowel Obstruction °A small bowel obstruction is a blockage in the small bowel. The small bowel, which is also called the small intestine, is a long, slender tube that connects the stomach to the colon. When a person eats and drinks, food and fluids go from the stomach to the small bowel. This is where most of the nutrients in the food and fluids are absorbed. °A small bowel obstruction will prevent food and fluids from passing through the small bowel as they normally do during digestion. The small bowel can become partially or completely blocked. This can cause symptoms such as abdominal pain, vomiting, and bloating. If this condition is not treated, it can be dangerous because the small bowel could rupture. °CAUSES °Common causes of this condition include: °· Scar tissue from previous surgery or radiation treatment. °· Recent surgery. This may cause the movements of the bowel to slow down and cause food to block the intestine. °· Hernias. °· Inflammatory bowel disease (colitis). °· Twisting of the bowel (volvulus). °· Tumors. °· A foreign body. °· Slipping of a part of the bowel into another part (intussusception). °SYMPTOMS °Symptoms of this condition include: °· Abdominal pain. This may be dull cramps or sharp pain. It may occur in one area, or it may be present in the entire abdomen. Pain can range from mild to severe, depending on the degree of obstruction. °· Nausea and vomiting. Vomit may be greenish or a yellow bile color. °· Abdominal bloating. °· Constipation. °· Lack of passing gas. °· Frequent belching. °· Diarrhea. This may occur if the obstruction is partial and runny stool is able to leak around the obstruction. °DIAGNOSIS °This condition may be diagnosed based on a physical exam, medical history, and X-rays of the abdomen. You may also have other tests, such as a CT scan of the abdomen and pelvis. °TREATMENT °Treatment for this condition depends on the cause and severity of the problem.  Treatment options may include: °· Bed rest along with fluids and pain medicines that are given through an IV tube inserted into one of your veins. Sometimes, this is all that is needed for the obstruction to improve. °· Following a simple diet. In some cases, a clear liquid diet may be required for several days. This allows the bowel to rest. °· Placement of a small tube (nasogastric tube) into the stomach. When the bowel is blocked, it usually swells up like a balloon that is filled with air and fluids. The air and fluids may be removed by suction through the nasogastric tube. This can help with pain, discomfort, and nausea. It can also help the obstruction to clear up faster. °· Surgery. This may be required if other treatments do not work. Bowel obstruction from a hernia may require early surgery and can be an emergency procedure. Surgery may also be required for scar tissue that causes frequent or severe obstructions. °HOME CARE INSTRUCTIONS °· Get plenty of rest. °· Follow instructions from your health care provider about eating restrictions. You may need to avoid solid foods and consume only clear liquids until your condition improves. °· Take over-the-counter and prescription medicines only as told by your health care provider. °· Keep all follow-up visits as told by your health care provider. This is important. °SEEK MEDICAL CARE IF: °· You have a fever. °· You have chills. °SEEK IMMEDIATE MEDICAL CARE IF: °· You have increased pain or cramping. °· You vomit blood. °· You have uncontrolled vomiting or nausea. °· You cannot drink   fluids because of vomiting or pain.  You develop confusion.  You begin feeling very dry or thirsty (dehydrated).  You have severe bloating.  You feel extremely weak or you faint.   This information is not intended to replace advice given to you by your health care provider. Make sure you discuss any questions you have with your health care provider.   Document Released:  12/23/2005 Document Revised: 06/27/2015 Document Reviewed: 11/30/2014 Elsevier Interactive Patient Education 2016 [employer].  Low-Fiber Diet for the next 3-4 days and bowel function is back to your normal.   Fiber is found in fruits, vegetables, and whole grains. A low-fiber diet restricts fibrous foods that are not digested in the small intestine. A diet containing about 10-15 grams of fiber per day is considered low fiber. Low-fiber diets may be used to:  Promote healing and rest the bowel during intestinal flare-ups.  Prevent blockage of a partially obstructed or narrowed gastrointestinal tract.  Reduce fecal weight and volume.  Slow the movement of feces. You may be on a low-fiber diet as a transitional diet following surgery, after an injury (trauma), or because of a short (acute) or lifelong (chronic) illness. Your health care provider will determine the length of time you need to stay on this diet.  WHAT DO I NEED TO KNOW ABOUT A LOW-FIBER DIET? Always check the fiber content on the packaging's Nutrition Facts label, especially on foods from the grains list. Ask your dietitian if you have questions about specific foods that are related to your condition, especially if the food is not listed below. In general, a low-fiber food will have less than 2 g of fiber. WHAT FOODS CAN I EAT? Grains All breads and crackers made with white flour. Sweet rolls, doughnuts, waffles, pancakes, [place] toast, bagels. Pretzels, Melba toast, zwieback. Well-cooked cereals, such as cornmeal, farina, or cream cereals. Dry cereals that do not contain whole grains, fruit, or nuts, such as refined corn, wheat, rice, and oat cereals. Potatoes prepared any way without skins, plain pastas and noodles, refined white rice. Use white flour for baking and making sauces. Use allowed list of grains for casseroles, dumplings, and puddings.  Vegetables Strained tomato and vegetable juices. Fresh lettuce, cucumber, spinach.  Well-cooked (no skin or pulp) or canned vegetables, such as asparagus, bean sprouts, beets, carrots, green beans, mushrooms, potatoes, pumpkin, spinach, yellow squash, tomato sauce/puree, turnips, yams, and zucchini. Keep servings limited to  cup.  Fruits All fruit juices except prune juice. Cooked or canned fruits without skin and seeds, such as applesauce, apricots, cherries, fruit cocktail, grapefruit, grapes, mandarin oranges, melons, peaches, pears, pineapple, and plums. Fresh fruits without skin, such as apricots, avocados, bananas, melons, pineapple, nectarines, and peaches. Keep servings limited to  cup or 1 piece.  Meat and Other Protein Sources Ground or well-cooked tender beef, ham, veal, lamb, pork, or poultry. Eggs, plain cheese. Fish, oysters, shrimp, lobster, and other seafood. Liver, organ meats. Smooth nut butters. Dairy All milk products and alternative dairy substitutes, such as soy, rice, almond, and coconut, not containing added whole nuts, seeds, or added fruit. Beverages Decaf coffee, fruit, and vegetable juices or smoothies (small amounts, with no pulp or skins, and with fruits from allowed list), sports drinks, herbal tea. Condiments Ketchup, mustard, vinegar, cream sauce, cheese sauce, cocoa powder. Spices in moderation, such as allspice, basil, bay leaves, celery powder or leaves, cinnamon, cumin powder, curry powder, ginger, mace, marjoram, onion or garlic powder, oregano, paprika, parsley flakes, ground pepper, rosemary,  sage, savory, tarragon, thyme, and turmeric. Sweets and Desserts Plain cakes and cookies, pie made with allowed fruit, pudding, custard, cream pie. Gelatin, fruit, ice, sherbet, frozen ice pops. Ice cream, ice milk without nuts. Plain hard candy, honey, jelly, molasses, syrup, sugar, chocolate syrup, gumdrops, marshmallows. Limit overall sugar intake.  Fats and Oil Margarine, butter, cream, mayonnaise, salad oils, plain salad dressings made from allowed  foods. Choose healthy fats such as olive oil, canola oil, and omega-3 fatty acids (such as found in salmon or tuna) when possible.  Other Bouillon, broth, or cream soups made from allowed foods. Any strained soup. Casseroles or mixed dishes made with allowed foods. The items listed above may not be a complete list of recommended foods or beverages. Contact your dietitian for more options.  WHAT FOODS ARE NOT RECOMMENDED? Grains All whole wheat and whole grain breads and crackers. Multigrains, rye, bran seeds, nuts, or coconut. Cereals containing whole grains, multigrains, bran, coconut, nuts, raisins. Cooked or dry oatmeal, steel-cut oats. Coarse wheat cereals, granola. Cereals advertised as high fiber. Potato skins. Whole grain pasta, wild or brown rice. Popcorn. Coconut flour. Bran, buckwheat, corn bread, multigrains, rye, wheat germ.  Vegetables Fresh, cooked or canned vegetables, such as artichokes, asparagus, beet greens, broccoli, Brussels sprouts, cabbage, celery, cauliflower, corn, eggplant, kale, legumes or beans, okra, peas, and tomatoes. Avoid large servings of any vegetables, especially raw vegetables.  Fruits Fresh fruits, such as apples with or without skin, berries, cherries, figs, grapes, grapefruit, guavas, kiwis, mangoes, oranges, papayas, pears, persimmons, pineapple, and pomegranate. Prune juice and juices with pulp, stewed or dried prunes. Dried fruits, dates, raisins. Fruit seeds or skins. Avoid large servings of all fresh fruits. Meats and Other Protein Sources Tough, fibrous meats with gristle. Chunky nut butter. Cheese made with seeds, nuts, or other foods not recommended. Nuts, seeds, legumes (beans, including baked beans), dried peas, beans, lentils.  Dairy Yogurt or cheese that contains nuts, seeds, or added fruit.  Beverages Fruit juices with high pulp, prune juice. Caffeinated coffee and teas.  Condiments Coconut, maple syrup, pickles, olives. Sweets and  Desserts Desserts, cookies, or candies that contain nuts or coconut, chunky peanut butter, dried fruits. Jams, preserves with seeds, marmalade. Large amounts of sugar and sweets. Any other dessert made with fruits from the not recommended list.  Other Soups made from vegetables that are not recommended or that contain other foods not recommended.  The items listed above may not be a complete list of foods and beverages to avoid. Contact your dietitian for more information.   This information is not intended to replace advice given to you by your health care provider. Make sure you discuss any questions you have with your health care provider.   Document Released: 03/28/2002 Document Revised: 10/11/2013 Document Reviewed: 08/29/2013 Elsevier Interactive Patient Education [employer].

## 2016-04-08 ENCOUNTER — Encounter: Payer: Self-pay | Admitting: General Surgery

## 2016-04-08 NOTE — Progress Notes (Signed)
  Subjective: Feels better tolerating PO's. Says he still has some abdominal discomfort, but eating and drinking well.  BM x 2 yesterday.  Wants to go back to work today.   Objective: Vital signs in last 24 hours: Temp:  [98.1 F (36.7 C)-98.7 F (37.1 C)] 98.7 F (37.1 C) (06/20 0505) Pulse Rate:  [49-63] 63 (06/20 0505) Resp:  [16] 16 (06/20 0505) BP: (135-161)/(75-83) 135/75 mmHg (06/20 0505) SpO2:  [90 %-94 %] 94 % (06/20 0505) Last BM Date: 04/06/16 660 Po 750 urine BM x 2 Labs OK yesterday Intake/Output from previous day: 06/19 0701 - 06/20 0700 In: 2593.3 [P.O.:660; I.V.:1883.3; IV Piggyback:50] Out: 750 [Urine:750] Intake/Output this shift:    General appearance: alert, cooperative and no distress Resp: clear to auscultation bilaterally GI: soft, non-tender; bowel sounds normal; no masses,  no organomegaly  Lab Results:   Recent Labs  04/07/16 0804  WBC 5.2  HGB 14.1  HCT 43.7  PLT 171    BMET  Recent Labs  04/07/16 0804  NA 140  K 4.0  CL 107  CO2 27  GLUCOSE 102*  BUN 9  CREATININE 0.89  CALCIUM 8.6*   PT/INR No results for input(s): LABPROT, INR in the last 72 hours.   Recent Labs Lab 04/04/16 2032  AST 30  ALT 20  ALKPHOS 165*  BILITOT 1.3*  PROT 8.6*  ALBUMIN 4.9     Lipase     Component Value Date/Time   LIPASE 14 04/04/2016 2032     Studies/Results: No results found.  Medications: . diazepam  5 mg Oral QHS  . enoxaparin (LOVENOX) injection  40 mg Subcutaneous Q24H  . escitalopram  20 mg Oral Daily  . famotidine (PEPCID) IV  20 mg Intravenous Q12H  . 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  Dr. Lucia Gaskins did his original surgery.     LOS: 4 days     Victoria Henshaw 04/08/2016 [phone]

## 2016-04-09 ENCOUNTER — Telehealth: Payer: Self-pay | Admitting: Behavioral Health

## 2016-04-09 NOTE — Telephone Encounter (Signed)
Patient declined TCM/Hospital Follow-up at this time. He stated, "I feel that I'm doing all right and actually getting ready for a vacation real soon". Advised patient that if symptoms arise or he changes his mind about being seen, please contact the office to schedule an appointment with PCP. He verbalized understanding and did not have any concerns prior to call ending.

## 2016-05-04 ENCOUNTER — Other Ambulatory Visit: Payer: Self-pay | Admitting: Family

## 2016-05-05 NOTE — Telephone Encounter (Signed)
Rx sent to the pharmacy by e-script.//AB/CMA 

## 2016-05-08 ENCOUNTER — Encounter: Payer: Self-pay | Admitting: Family

## 2016-05-08 ENCOUNTER — Telehealth: Payer: Self-pay | Admitting: Family

## 2016-05-08 MED ORDER — HYDROCODONE-ACETAMINOPHEN 5-325 MG PO TABS: 1.0000 | ORAL_TABLET | ORAL | Status: DC | PRN

## 2016-05-08 MED ORDER — DIAZEPAM 5 MG PO TABS: ORAL_TABLET | ORAL | Status: DC

## 2016-05-08 NOTE — Telephone Encounter (Signed)
Last diazepam:  03/18/16, #30 Last hydrocodone: 01/07/16, #15 UDS: 11/06/15, moderate due now Next OV: none scheduled, due now.  Rxs printed and forwarded to PCP for signature. Pt will need to provide UDS and schedule f/u when he picks up Rxs.

## 2016-05-08 NOTE — Telephone Encounter (Signed)
Refill request for diazepam and HYDROcodone

## 2016-05-08 NOTE — Telephone Encounter (Signed)
Rxs placed at front desk and pt has been notified. 

## 2016-05-14 ENCOUNTER — Ambulatory Visit (INDEPENDENT_AMBULATORY_CARE_PROVIDER_SITE_OTHER): Payer: Managed Care, Other (non HMO) | Admitting: Family

## 2016-05-14 ENCOUNTER — Encounter: Payer: Self-pay | Admitting: Family

## 2016-05-14 VITALS — BP 126/88 | HR 60 | Temp 97.7°F | Resp 16 | Ht 70.5 in | Wt 168.4 lb

## 2016-05-14 DIAGNOSIS — Z23 Encounter for immunization: Secondary | ICD-10-CM | POA: Diagnosis not present

## 2016-05-14 DIAGNOSIS — M545 Low back pain, unspecified: Secondary | ICD-10-CM

## 2016-05-14 DIAGNOSIS — K111 Hypertrophy of salivary gland: Secondary | ICD-10-CM

## 2016-05-14 DIAGNOSIS — K529 Noninfective gastroenteritis and colitis, unspecified: Secondary | ICD-10-CM | POA: Diagnosis not present

## 2016-05-14 DIAGNOSIS — F411 Generalized anxiety disorder: Secondary | ICD-10-CM

## 2016-05-14 DIAGNOSIS — M79672 Pain in left foot: Secondary | ICD-10-CM

## 2016-05-14 DIAGNOSIS — G8929 Other chronic pain: Secondary | ICD-10-CM

## 2016-05-14 LAB — BASIC METABOLIC PANEL
BUN: 17 mg/dL (ref 6–23)
CO2: 29 mEq/L (ref 19–32)
Calcium: 9.2 mg/dL (ref 8.4–10.5)
Chloride: 104 mEq/L (ref 96–112)
Creatinine, Ser: 0.92 mg/dL (ref 0.40–1.50)
GFR: 88.73 mL/min (ref >60.00)
Glucose, Bld: 95 mg/dL (ref 70–99)
POTASSIUM: 4 meq/L (ref 3.5–5.1)
SODIUM: 139 meq/L (ref 135–145)

## 2016-05-14 MED ORDER — TRAZODONE HCL 150 MG PO TABS: ORAL_TABLET | ORAL | 1 refills | Status: DC

## 2016-05-14 MED ORDER — CHOLESTYRAMINE 4 G PO PACK: 4.0000 g | PACK | Freq: Two times a day (BID) | ORAL | 2 refills | Status: DC

## 2016-05-14 NOTE — Patient Instructions (Signed)
You will be contacted about scheduling your CT scan to evaluate the swollen glands in your neck. For foot pain, we will get you set up to see podiatry. For Diarrhea- please try Questran powder twice daily.  Let me know if you have any issues with constipation after starting.

## 2016-05-14 NOTE — Progress Notes (Signed)
Pre visit review using our clinic review tool, if applicable. No additional management support is needed unless otherwise documented below in the visit note. 

## 2016-05-14 NOTE — Addendum Note (Signed)
Addended by: Kelle Darting A on: 05/14/2016 12:08 PM   Modules accepted: Orders

## 2016-05-14 NOTE — Progress Notes (Signed)
Subjective:    Patient ID: Justin [name], male    DOB: Dec 24, 1955, 60 y.o.   MRN: [id number]  HPI  Mr. Crilly is a 60 yr old male who presents today for follow up.  1) anxiety- maintained on diazepam, lexapro. Reports thathe is taking "nugenix.    2) Foot pain- pt reports continued left foot pain. Reports that he went to [facility].    3) SBO- admitted 6/16-6/20 with SBO/? Possible radiation enteritis. Patient reports that he is having diarrhea. Uses imodium without improvement. Diarrhea has been present for years, has hx of colon CA with sigmoid colectomy, radiation back in 2003. Reports that he "messes myself" at night.    4) Reports some chronic low back pain, non-radiating. This is unchanged from his baseline.  Review of Systems    see HPI  Past Medical History:  Diagnosis Date  . Anxiety    takes approximately 10 diazepam/month-contracts to no more than that amount  . BPH (benign prostatic hypertrophy)    sees urology  . Colon cancer [employer]) 2003   specialist due for repeat colonoscopy in 2012  . Depression   . History of kidney stones   . Hyperlipidemia   . Insomnia   . Multiple fractures 08/2008   patient fell off a rood about 13 feet onto concrete-multiple fractures including vertrebral  . Overactive bladder      Social History   Social History  . Marital status: Married    Spouse name: N/A  . Number of children: N/A  . Years of education: N/A   Occupational History  . Not on file.   Social History Main Topics  . Smoking status: Former Research scientist (life sciences)  . Smokeless tobacco: Never Used     Comment: quit tobacco 2000 smoked for 20 years  . Alcohol use No  . Drug use: No  . Sexual activity: Not on file   Other Topics Concern  . 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 COLECTOMY  02/2002   sigmoid colectomy  . ELBOW SURGERY    . HERNIA REPAIR    . HIP FRACTURE SURGERY      Family History  Problem Relation Age of Onset  . Coronary artery disease Father   . Other Neg Hx     no FH od colon cancer    No Known Allergies  Current Outpatient Prescriptions on File Prior to Visit  Medication Sig Dispense Refill  . diazepam (VALIUM) 5 MG tablet TAKE 5 MG BY MOUTH AT BEDTIME  AS NEEDED FOR SLEEP 30 tablet 0  . escitalopram (LEXAPRO) 20 MG tablet TAKE 1 TABLET (20 MG TOTAL) BY MOUTH DAILY. 90 tablet 1  . gabapentin (NEURONTIN) 300 MG capsule TAKE 1 CAPSULE (300 MG TOTAL) BY MOUTH 2 (TWO) TIMES DAILY. 60 capsule 1  . HYDROcodone-acetaminophen (NORCO/VICODIN) 5-325 MG tablet Take 1-2 tablets by mouth every 4 (four) hours as needed for moderate pain or severe pain. 15 tablet 0  . sildenafil (REVATIO) 20 MG tablet 1-2 tabs by mouth once daily as needed 50 tablet 1   No current  facility-administered medications on file prior to visit.     BP 126/88   Pulse 60   Temp 97.7 F (36.5 C) (Oral)   Resp 16   Ht 5' 10.5" (1.791 m)   Wt 168 lb 6.4 oz (76.4 kg)   BMI 23.82 kg/m    Objective:   Physical Exam  Constitutional: He is oriented to person, place, and time. He appears well-developed and well-nourished. No distress.  HENT:  Head: Normocephalic and atraumatic.  Neck:  + large parotid glands noted bilaterally L>R  Cardiovascular: Normal rate and regular rhythm.   No murmur heard. Pulmonary/Chest: Effort normal and breath sounds normal. No respiratory distress. He has no wheezes. He has no rales.  Abdominal: Soft. Bowel sounds are normal. He exhibits no distension. There is no tenderness. There is no rebound and no guarding.  Musculoskeletal: He exhibits no edema.  Neurological: He is alert and oriented to person, place, and time.    Skin: Skin is warm and dry.  Psychiatric: He has a normal mood and affect. His behavior is normal. Thought content normal.          Assessment & Plan:  Parotid enlargement- will obtain CT neck to further evaluate- asymptomatic.  Chronic low back pain- unchanged, monitor.  Left foot pain- will refer to podiatry for further evaluation.  Chronic diarrhea- will give trial of questran.    Anxiety state- stable on current meds, continue same.  Zostavax today.

## 2016-05-15 ENCOUNTER — Encounter: Payer: Self-pay | Admitting: Family

## 2016-05-19 ENCOUNTER — Telehealth: Payer: Self-pay | Admitting: Family

## 2016-05-19 DIAGNOSIS — K111 Hypertrophy of salivary gland: Secondary | ICD-10-CM

## 2016-05-19 NOTE — Telephone Encounter (Signed)
Insurance denied CT neck. Will order [place] instead.

## 2016-05-20 ENCOUNTER — Telehealth: Payer: Self-pay | Admitting: *Deleted

## 2016-05-20 NOTE — Telephone Encounter (Signed)
Per Anderson Malta at [facility]. Lab presumptive results just for a broad group of drugs. If presumptive is positive they do more definitive testing that checks the actual concentration. This result is under 'drug testing results - other analytes' which came back negative on this pt. She states this can occur due to interference from other analytes in the pt's system or a small portion could be in the pt's system but not above the cutoff value so it will appear negative on the definitive testing.

## 2016-05-20 NOTE — Telephone Encounter (Signed)
Noted.  Lets leave as Moderate risk please.

## 2016-05-20 NOTE — Telephone Encounter (Signed)
-----   Message from Eliane Decree sent at 05/20/2016  1:56 PM EDT ----- Regarding: RE: UDS interpretaion Give the lab manager a call, I dont know that answer sorry, her name is jennifer at [phone]. ----- Message ----- From: Ronny Flurry, [place]: 05/20/2016   1:08 PM To: Amy Diona Fanti Subject: UDS interpretaion                              Can you check pt's UDS from 05/08/16? 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 Lab presumptive results says it was positive?  Can you clarify this for [place]?

## 2016-05-20 NOTE — Telephone Encounter (Signed)
Left message at below # for Anderson Malta to return my call.

## 2016-05-22 IMAGING — NM NM MISC PROCEDURE
3 series · 18 of 18 positions shown · non-contrast
Comparison: none

[Series 1: stress-gsp_(id)_sa · 6.4mm · 6.40mm/px · 6 of 512 frames shown]
[frame 43/512]
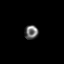
[frame 128/512]
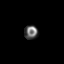
[frame 214/512]
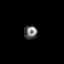
[frame 299/512]
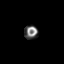
[frame 384/512]
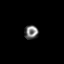
[frame 470/512]
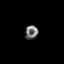

[Series 1: stress-sum-em_(id)_sa · 6.4mm · 6.40mm/px · 6 of 64 frames shown]
[frame 6/64]
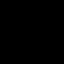
[frame 16/64]
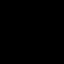
[frame 27/64]
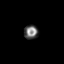
[frame 38/64]
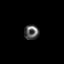
[frame 48/64]
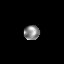
[frame 59/64]
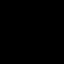

[Series 1: rest_(id)_sa · 6.4mm · 6.40mm/px · 6 of 64 frames shown]
[frame 6/64]
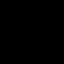
[frame 16/64]
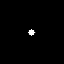
[frame 27/64]
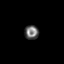
[frame 38/64]
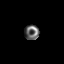
[frame 48/64]
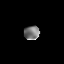
[frame 59/64]
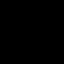

[18 of 18 positions shown; findings below may reference images not displayed]

Canned report from images found in remote index.

Refer to host system for actual result text.

## 2016-05-28 ENCOUNTER — Ambulatory Visit (HOSPITAL_BASED_OUTPATIENT_CLINIC_OR_DEPARTMENT_OTHER)
Admission: RE | Admit: 2016-05-28 | Discharge: 2016-05-28 | Disposition: A | Discharge status: Home / Self Care | Payer: Managed Care, Other (non HMO) | Source: Ambulatory Visit | Attending: Podiatry | Admitting: Podiatry

## 2016-05-28 ENCOUNTER — Ambulatory Visit (INDEPENDENT_AMBULATORY_CARE_PROVIDER_SITE_OTHER): Payer: Managed Care, Other (non HMO) | Admitting: Podiatry

## 2016-05-28 ENCOUNTER — Encounter: Payer: Self-pay | Admitting: Podiatry

## 2016-05-28 ENCOUNTER — Ambulatory Visit (HOSPITAL_BASED_OUTPATIENT_CLINIC_OR_DEPARTMENT_OTHER)
Admission: RE | Admit: 2016-05-28 | Discharge: 2016-05-28 | Disposition: A | Discharge status: Home / Self Care | Payer: Managed Care, Other (non HMO) | Source: Ambulatory Visit | Attending: Family | Admitting: Family

## 2016-05-28 VITALS — BP 125/74 | HR 77 | Resp 18

## 2016-05-28 DIAGNOSIS — R52 Pain, unspecified: Secondary | ICD-10-CM

## 2016-05-28 DIAGNOSIS — M19172 Post-traumatic osteoarthritis, left ankle and foot: Secondary | ICD-10-CM | POA: Diagnosis not present

## 2016-05-28 DIAGNOSIS — K111 Hypertrophy of salivary gland: Secondary | ICD-10-CM | POA: Insufficient documentation

## 2016-05-28 DIAGNOSIS — M8580 Other specified disorders of bone density and structure, unspecified site: Secondary | ICD-10-CM | POA: Diagnosis not present

## 2016-05-28 DIAGNOSIS — M779 Enthesopathy, unspecified: Secondary | ICD-10-CM | POA: Diagnosis not present

## 2016-05-28 MED ORDER — MELOXICAM 15 MG PO TABS: 15.0000 mg | ORAL_TABLET | Freq: Every day | ORAL | 2 refills | Status: DC

## 2016-05-28 NOTE — Progress Notes (Signed)
   Subjective:    Patient ID: Justin [name], male    DOB: 04-Nov-1955, 60 y.o.   MRN: [id number]  HPI  60 year old male presents the office if her congenital left foot pain on he states the also dispensed hurting since 2011 after he had a calcaneal fracture which she subsequently had open reduction internal fixation. He states he has constant numbness and burning pain. Also had part of his foot as well as just descries a deep, throbbing pain and he points along the sinus tarsi. He also gets some pain in the ball of his heel as well. He did follow-up with orthopedics as well for this and he wants to do a nerve conduction test. He has had shoe gear changes, orthotics, padding without any relief. No other complaints at this time.  Review of Systems  All other systems reviewed and are negative.      Objective:   Physical Exam General: AAO x3, NAD  Dermatological: Skin is warm, dry and supple bilateral. Nails x 10 are well manicured; remaining integument appears unremarkable at this time. There are no open sores, no preulcerative lesions, no rash or signs of infection present.  Vascular: Dorsalis Pedis artery and Posterior Tibial artery pedal pulses are 2/4 bilateral with immedate capillary fill time. Pedal hair growth present.  There is no pain with calf compression, swelling, warmth, erythema.   Neruologic: Grossly intact via light touch bilateral. Vibratory intact via tuning fork bilateral. Protective threshold with Semmes Wienstein monofilament intact to all pedal sites bilateral  Musculoskeletal: There is tenderness on the lateral sinus tarsi of the left foot. There is decreased range of motion of the subtalar joint. No smoking crepitations present. Mild tenderness on the plantar medial tubercle of the calcaneus at the insertion of her fashion the left side. No pain along the course of plantar fascial along the foot. No tenderness lateral compression of the calcaneus. The scar from the prior  surgery is well-healed. MMT 5/5.  Gait: Unassisted, Nonantalgic.      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 -Given the majority symptoms appear to be localized in the sinus tarsi this time I discussed with him a steroid injection is in both diagnostic and therapeutic purposes. He wishes to proceed with this. Under sterile conditions a mixture of dexamethasone phosphate and local anesthetic with a small mount of Kenalog was infiltrated into the lateral aspect of the foot on the sinus tarsi. Postinjection care with discussed with the patient. I discussed them long-term possible Mezzo brace, MRI, and surgery. Also seen back in 2 weeks we'll see how he did the injection. In the meantime encouraged to call any questions, concerns or any changes symptoms.  Celesta Gentile, DPM

## 2016-05-29 ENCOUNTER — Telehealth: Payer: Self-pay | Admitting: *Deleted

## 2016-05-29 NOTE — Telephone Encounter (Signed)
Called patient and left a message for the patient to call me back, was just checking to see how patient was doing. Justin [name]

## 2016-06-18 ENCOUNTER — Telehealth: Payer: Self-pay | Admitting: *Deleted

## 2016-06-18 ENCOUNTER — Encounter: Payer: Self-pay | Admitting: Podiatry

## 2016-06-18 ENCOUNTER — Ambulatory Visit (INDEPENDENT_AMBULATORY_CARE_PROVIDER_SITE_OTHER): Payer: Managed Care, Other (non HMO) | Admitting: Podiatry

## 2016-06-18 DIAGNOSIS — M79672 Pain in left foot: Secondary | ICD-10-CM

## 2016-06-18 DIAGNOSIS — M779 Enthesopathy, unspecified: Secondary | ICD-10-CM

## 2016-06-18 MED ORDER — HYDROCODONE-ACETAMINOPHEN 5-325 MG PO TABS: 1.0000 | ORAL_TABLET | ORAL | 0 refills | Status: DC | PRN

## 2016-06-18 NOTE — Telephone Encounter (Signed)
Pt walked into office stating his foot and back pain had worsened and he is requesting a refill of hydrocodone. Last Rx: 05/08/16, #15. Controlled substance registry reviewed by NP and rx printed and given to pt. Pt currently seeing podiatry for his foot pain and has been seeing PCP for back pain. Per verbal from PCP, will address back pain at pt's next follow up.

## 2016-06-19 NOTE — Progress Notes (Signed)
Subjective: 60 year old male presents to the office today for follow-up evaluation of left foot pain. He states that he was doing well for about 1 week. The pain with "side to side" is improved. He still gets pain to the bottom of his heel.  Denies any systemic complaints such as fevers, chills, nausea, vomiting. No acute changes since last appointment, and no other complaints at this time.   Objective: AAO x3, NAD DP/PT pulses palpable bilaterally, CRT less than 3 seconds Mild, but improved, tenderness to the lateral aspect of the left foot along the sinus tarsi. The majority of tenderness is localized along the plantar aspect of the heel. There is no pain along the plantar fascia. No pain with lateral compression of the calcaneous.  No edema, erythema, increase in warmth to bilateral lower extremities.  No open lesions or pre-ulcerative lesions.  No pain with calf compression, swelling, warmth, erythema  Assessment: 60 year old male with left plantar heel pain.  Plan: -All treatment options discussed with the patient including all alternatives, risks, complications.  -At this time discussed a steroid injection to the plantar aspect of the heel along the plantar fascial insertion. He wishes to proceed with this. Under sterile conditions a mixture Kenalog and local anesthetic was infiltrated into the area of maximal tenderness without complications. Post injection care was discussed. -Discussed the symptoms continue possible advanced imaging  -Follow-up as scheduled or sooner if needed.   Celesta Gentile, DPM

## 2016-06-27 ENCOUNTER — Other Ambulatory Visit: Payer: Self-pay | Admitting: Family

## 2016-06-30 NOTE — Telephone Encounter (Signed)
See rx. 

## 2016-06-30 NOTE — Telephone Encounter (Signed)
Last seen 05/14/16, UDS positive Last filled #30, 0 refills Please advise----PC

## 2016-06-30 NOTE — Telephone Encounter (Signed)
Rx faxed to pharmacy  

## 2016-07-08 ENCOUNTER — Ambulatory Visit: Payer: Managed Care, Other (non HMO) | Admitting: Podiatry

## 2016-08-02 ENCOUNTER — Other Ambulatory Visit: Payer: Self-pay | Admitting: Family

## 2016-08-05 ENCOUNTER — Other Ambulatory Visit: Payer: Self-pay | Admitting: Family

## 2016-08-05 NOTE — Telephone Encounter (Signed)
UDS:  05/08/16 and due 08/08/16 Last Rx:  06/17/16, #15. Last OV:  05/14/16 Next OV:  Due 08/14/16 and needs to schedule.  Please advise request?

## 2016-08-05 NOTE — Telephone Encounter (Signed)
Caller name: Relationship to patient: Self Can be reached: [phone]  Pharmacy:  Reason for call: Refill HYDROcodone-acetaminophen (NORCO/VICODIN) 5-325 MG tablet [id number]

## 2016-08-06 MED ORDER — HYDROCODONE-ACETAMINOPHEN 5-325 MG PO TABS: 1.0000 | ORAL_TABLET | ORAL | 0 refills | Status: DC | PRN

## 2016-08-06 NOTE — Telephone Encounter (Signed)
Rx was placed at front desk for pt to pick up and pt has been made aware. UDS will be collected at time of pick up.

## 2016-08-06 NOTE — Telephone Encounter (Signed)
Medically appropriate.

## 2016-08-06 NOTE — Telephone Encounter (Signed)
See rx.    Dr. Charlett Blake, this patient uses small amounts of hydrocodone for chronic right ankle pain.  Do you agree that this remains medically appropriate for this patient?

## 2016-08-27 ENCOUNTER — Telehealth: Payer: Self-pay | Admitting: Family

## 2016-08-27 MED ORDER — GABAPENTIN 300 MG PO CAPS: ORAL_CAPSULE | ORAL | 1 refills | Status: DC

## 2016-08-27 NOTE — Telephone Encounter (Signed)
90 day supply sent to [facility] in error. Cancelled Rx and [facility]

## 2016-08-27 NOTE — Telephone Encounter (Signed)
Gabapentin Rx printed and [facility]

## 2016-08-27 NOTE — Telephone Encounter (Signed)
[place]- [phone] called in, he received the refill for pt's gabapentin. He says that pt's insurance requires a 90 day supply instead. Please assist further.   [id number] [place], [place] - [address] AT [address]OSS FROM [address]

## 2016-10-01 ENCOUNTER — Telehealth: Payer: Self-pay | Admitting: Family

## 2016-10-01 NOTE — Telephone Encounter (Signed)
Unfortunately, this medication cannot be faxed or sent electronically.  It must be picked up.  I am not familiar with any PCP's in [place].  I recommend that he contact his insurance for a list of in-network providers in his area.

## 2016-10-01 NOTE — Telephone Encounter (Signed)
Notified pt and he voices understanding. 

## 2016-10-01 NOTE — Telephone Encounter (Signed)
Caller name: Relationship to patient: Self Can be reached: [phone] Pharmacy:  Reason for call: Request refill on HYDROcodone-acetaminophen (NORCO/VICODIN) 5-325 MG tablet [id number] Patient states he has moved to [name], [place] and can not come into the office to pick up Rx. Also, wants provider to refer him to a PCP in [place].

## 2016-10-02 ENCOUNTER — Telehealth: Payer: Self-pay | Admitting: Family

## 2016-10-02 NOTE — Telephone Encounter (Signed)
Pharmacy called stating that patient has moved to [facility] and has questions regarding refilling and sending his prescriptions to the new pharmacy. Please advise  Pharmacy phone: [phone]

## 2016-10-03 MED ORDER — GABAPENTIN 300 MG PO CAPS: ORAL_CAPSULE | ORAL | 1 refills | Status: DC

## 2016-10-03 NOTE — Telephone Encounter (Signed)
[facility]. Pt is needed gabapentin refill transferred to [facility] in [place] but current Rx does not have supervising MD info and [place] will require supervising MD info on all Rx from PAs or NPs. Sent rx electronically with needed info to the [place] location.

## 2016-10-15 ENCOUNTER — Telehealth: Payer: Self-pay | Admitting: Family

## 2016-10-15 NOTE — Telephone Encounter (Signed)
Caller name: [place]  Relation to pt: self  Call back number: [phone] Pharmacy: [facility]  Reason for call: Pt is requesting refill on meds HYDROcodone-acetaminophen (NORCO/VICODIN) 5-325 MG tablet (last 08-06-16) and refill on diazepam (VALIUM) 5 MG tablet (06-30-2016). Please advise.

## 2016-10-16 ENCOUNTER — Other Ambulatory Visit: Payer: Self-pay

## 2016-10-16 MED ORDER — DIAZEPAM 5 MG PO TABS: 5.0000 mg | ORAL_TABLET | Freq: Every evening | ORAL | 0 refills | Status: DC | PRN

## 2016-10-16 MED ORDER — HYDROCODONE-ACETAMINOPHEN 5-325 MG PO TABS: 1.0000 | ORAL_TABLET | ORAL | 0 refills | Status: DC | PRN

## 2016-10-16 NOTE — Telephone Encounter (Signed)
Patient is calling to follow up on this medication refill. Please advise °

## 2016-10-16 NOTE — Telephone Encounter (Signed)
HYDROcodone-acetaminophen (NORCO/VICODIN) 5-325 MG tablet Last filled:  10/16/16  diazepam (VALIUM) 5 MG tablet Last filled: 10/16/16  Please advise Santiago Glad

## 2016-10-17 NOTE — Telephone Encounter (Signed)
This was already done. It should be up front.

## 2016-11-22 ENCOUNTER — Other Ambulatory Visit: Payer: Self-pay | Admitting: Family

## 2016-11-24 NOTE — Telephone Encounter (Signed)
rx's sent for 30 day supply. We will be unable to provide further refills without office visit so I would encourage him to establish with a PCP in [place].

## 2016-11-24 NOTE — Telephone Encounter (Signed)
Justin [name]-- pt has relocated to [place].  Please advise lexapro and trazodone refills?

## 2016-11-26 NOTE — Telephone Encounter (Signed)
Notified pt and he voices understanding. 

## 2016-12-26 ENCOUNTER — Other Ambulatory Visit: Payer: Self-pay | Admitting: Family

## 2016-12-26 ENCOUNTER — Telehealth: Payer: Self-pay | Admitting: Family

## 2016-12-26 ENCOUNTER — Ambulatory Visit (INDEPENDENT_AMBULATORY_CARE_PROVIDER_SITE_OTHER): Payer: Managed Care, Other (non HMO) | Admitting: Family

## 2016-12-26 ENCOUNTER — Encounter: Payer: Self-pay | Admitting: Family

## 2016-12-26 DIAGNOSIS — G8929 Other chronic pain: Secondary | ICD-10-CM | POA: Diagnosis not present

## 2016-12-26 DIAGNOSIS — F418 Other specified anxiety disorders: Secondary | ICD-10-CM | POA: Diagnosis not present

## 2016-12-26 DIAGNOSIS — G4700 Insomnia, unspecified: Secondary | ICD-10-CM | POA: Diagnosis not present

## 2016-12-26 MED ORDER — HYDROCODONE-ACETAMINOPHEN 5-325 MG PO TABS: 1.0000 | ORAL_TABLET | ORAL | 0 refills | Status: DC | PRN

## 2016-12-26 MED ORDER — DIAZEPAM 5 MG PO TABS: 5.0000 mg | ORAL_TABLET | Freq: Every evening | ORAL | 0 refills | Status: DC | PRN

## 2016-12-26 MED ORDER — GABAPENTIN 300 MG PO CAPS: ORAL_CAPSULE | ORAL | 0 refills | Status: DC

## 2016-12-26 NOTE — Assessment & Plan Note (Signed)
Refill provided for #15 hydrocodone. We discussed that he should establish with a provider in [place] where he is now living.

## 2016-12-26 NOTE — Telephone Encounter (Signed)
Spoke with pt scheduled appointment  today at 2:45.

## 2016-12-26 NOTE — Assessment & Plan Note (Signed)
Stable on lexapro, continue same.  

## 2016-12-26 NOTE — Telephone Encounter (Signed)
OK to send  30 day supply of gabapentin, but I cannot refill his controlled substances without seeing him in the office.

## 2016-12-26 NOTE — Telephone Encounter (Signed)
Caller name: Justin [name] Relation to pt: self Call back number: [phone] Pharmacy: [place]  Reason for call: Pt states is needing refill on HYDROcodone-acetaminophen (NORCO/VICODIN) 5-325 MG tablet and gabapentin (NEURONTIN) 300 MG capsule, pt states is living in [place] but has not established yet with provider and that he does not have any meds left and since he is in town taken care of sister, pt wanted to know if he can get refills on these meds. Please advise.

## 2016-12-26 NOTE — Patient Instructions (Signed)
Good luck in [place]!

## 2016-12-26 NOTE — Progress Notes (Signed)
Subjective:    Patient ID: Justin [name], male    DOB: 01-Oct-1956, 61 y.o.   MRN: [id number]  HPI  Justin [name] is a 60 yr old male who presents today for follow up. Moved to [place], but came back to town to take care of his ill sister.   Anxiety- He continues lexapro. Reports good mood. No anxiety symptoms.  He reports only rare use of valium.  He takes 1 tablet of trazodone at night for sleep.    Chronic low back pain, chronic left foot pain- has gabapentin bid which helps his pain.  He uses hydrocodone prn.    Chronic diarrhea- takes every day. If he misses the dose, he has recurrence of diarrhea.  Review of Systems    see HPI  Past Medical History:  Diagnosis Date  . Anxiety    takes approximately 10 diazepam/month-contracts to no more than that amount  . BPH (benign prostatic hypertrophy)    sees urology  . Colon cancer [facility]) 2003   specialist due for repeat colonoscopy in 2012  . Depression   . History of kidney stones   . Hyperlipidemia   . Insomnia   . Multiple fractures 08/2008   patient fell off a rood about 13 feet onto concrete-multiple fractures including vertrebral  . Overactive bladder      Social History   Social History  . Marital status: Married    Spouse name: N/A  . Number of children: N/A  . Years of education: N/A   Occupational History  . Not on file.   Social History Main Topics  . Smoking status: Former Research scientist (life sciences)  . Smokeless tobacco: Never Used     Comment: quit tobacco 2000 smoked for 20 years  . Alcohol use No  . Drug use: No  . Sexual activity: Not on file   Other Topics Concern  . 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  COLECTOMY  02/2002   sigmoid colectomy  . ELBOW SURGERY    . HERNIA REPAIR    . HIP FRACTURE SURGERY      Family History  Problem Relation Age of Onset  . Coronary artery disease Father   . Other Neg Hx     no FH od colon cancer    No Known Allergies  Current Outpatient Prescriptions on File Prior to Visit  Medication Sig Dispense Refill  . cholestyramine (QUESTRAN) 4 g packet Take 1 packet (4 g total) by mouth 2 (two) times daily. 60 each 2  . diazepam (VALIUM) 5 MG tablet Take 1 tablet (5 mg total) by mouth at bedtime as needed. for sleep 30 tablet 0  . escitalopram (LEXAPRO) 20 MG tablet TAKE 1 TABLET (20 MG TOTAL) BY MOUTH DAILY. 30 tablet 0  . gabapentin (NEURONTIN) 300 MG capsule TAKE 1 CAPSULE (300 MG TOTAL) BY MOUTH 2 (TWO) TIMES DAILY. 60 capsule 0  . HYDROcodone-acetaminophen (NORCO/VICODIN) 5-325 MG tablet Take 1-2 tablets by mouth every 4 (four) hours as needed for moderate pain or severe pain. 15 tablet 0  . traZODone (DESYREL) 150 MG tablet  TAKE 1 & 1/2 TABLETS BY MOUTH AT BEDTIME 45 tablet 0   No current facility-administered medications on file prior to visit.     BP 138/88 (BP Location: Right Arm, Patient Position: Sitting, Cuff Size: Normal)   Pulse 72   Temp 98.2 F (36.8 C) (Oral)   Resp 17   Ht 5\' 10"  (1.778 m)   Wt 157 lb 12.8 oz (71.6 kg)   SpO2 96%   BMI 22.64 kg/m    Objective:   Physical Exam  Constitutional: He is oriented to person, place, and time. He appears well-developed and well-nourished. No distress.  HENT:  Head: Normocephalic and atraumatic.  Cardiovascular: Normal rate and regular rhythm.   No murmur heard. Pulmonary/Chest: Effort normal and breath sounds normal. No respiratory distress. He has no wheezes. He has no rales.  Musculoskeletal: He exhibits no edema.  Neurological: He is alert and oriented to person, place, and time.  Skin: Skin is warm and dry.  Psychiatric: He has a normal mood and affect. His behavior is normal.  Thought content normal.          Assessment & Plan:  Review of the [place] controlled substance registry is performed. There are no fills noted because he is filling these prescriptions in [place].

## 2016-12-26 NOTE — Telephone Encounter (Signed)
Patient is using hydrocodone prn, 15 tablets/month for chronic left foot pain from injury and chronic low back pain. Do you agree that refills are medically appropriate?

## 2016-12-26 NOTE — Assessment & Plan Note (Signed)
Stable with HS trazodone.  

## 2016-12-26 NOTE — Progress Notes (Signed)
Pre visit review using our clinic review tool, if applicable. No additional management support is needed unless otherwise documented below in the visit note. 

## 2016-12-27 ENCOUNTER — Other Ambulatory Visit: Payer: Self-pay | Admitting: Family

## 2016-12-28 NOTE — Telephone Encounter (Signed)
Yes he uses them infrequently but if he needs more he would need to be seen by pain management

## 2016-12-30 ENCOUNTER — Encounter: Payer: Self-pay | Admitting: Family

## 2017-01-02 ENCOUNTER — Encounter: Payer: Self-pay | Admitting: Family

## 2017-01-02 ENCOUNTER — Telehealth: Payer: Self-pay | Admitting: Family

## 2017-01-02 NOTE — Telephone Encounter (Signed)
Pt tested positive for cocaine. See letter.

## 2017-01-05 ENCOUNTER — Other Ambulatory Visit: Payer: Self-pay | Admitting: Family

## 2017-02-17 ENCOUNTER — Telehealth: Payer: Self-pay | Admitting: *Deleted

## 2017-02-17 MED ORDER — TRAZODONE HCL 150 MG PO TABS: ORAL_TABLET | ORAL | 2 refills | Status: DC

## 2017-02-17 MED ORDER — ESCITALOPRAM OXALATE 20 MG PO TABS: ORAL_TABLET | ORAL | 2 refills | Status: DC

## 2017-02-17 MED ORDER — GABAPENTIN 300 MG PO CAPS: ORAL_CAPSULE | ORAL | 2 refills | Status: DC

## 2017-02-17 NOTE — Telephone Encounter (Signed)
Ok to send 1 month supply with 2 refills of each of those meds please.

## 2017-02-17 NOTE — Telephone Encounter (Signed)
Refills sent

## 2017-02-17 NOTE — Telephone Encounter (Signed)
[facility] [place] [facility]:  escitalopram, gabapentin and trazodone.  Pt has reported that he was "moving to [place]" since 09/2016. He was back in [place] in March and was seen by [place] and given refill of medications then. I believe we have previously told him that he needs to establish with PCP in [place].  Please advise current requests?

## 2017-03-20 ENCOUNTER — Ambulatory Visit (INDEPENDENT_AMBULATORY_CARE_PROVIDER_SITE_OTHER): Payer: Managed Care, Other (non HMO) | Admitting: Family

## 2017-03-20 ENCOUNTER — Encounter: Payer: Self-pay | Admitting: Family

## 2017-03-20 VITALS — BP 104/86 | HR 78 | Temp 98.2°F | Resp 16 | Ht 70.0 in | Wt 162.8 lb

## 2017-03-20 DIAGNOSIS — K4091 Unilateral inguinal hernia, without obstruction or gangrene, recurrent: Secondary | ICD-10-CM

## 2017-03-20 DIAGNOSIS — L723 Sebaceous cyst: Secondary | ICD-10-CM | POA: Diagnosis not present

## 2017-03-20 MED ORDER — SILDENAFIL CITRATE 20 MG PO TABS: ORAL_TABLET | ORAL | 0 refills | Status: DC

## 2017-03-20 NOTE — Patient Instructions (Signed)
You will be contacted about your referral to the surgeon.

## 2017-03-20 NOTE — Progress Notes (Signed)
Subjective:    Patient ID: Justin [name], male    DOB: Sep 16, 1956, 61 y.o.   MRN: [id number]  HPI  Justin [name] is a 61 yr old male who presents today with several concerns.  He notes a "bump" under his left arm and another on his left scrotum.  Also concerned about possible recurrent inguinal hernia in the right groin. He reports hx of multiple hernia repairs in the past.    Review of Systems    see HPI  Past Medical History:  Diagnosis Date  . Anxiety    takes approximately 10 diazepam/month-contracts to no more than that amount  . BPH (benign prostatic hypertrophy)    sees urology  . Colon cancer [facility]) 2003   specialist due for repeat colonoscopy in 2012  . Depression   . History of kidney stones   . Hyperlipidemia   . Insomnia   . Multiple fractures 08/2008   patient fell off a rood about 13 feet onto concrete-multiple fractures including vertrebral  . Overactive bladder      Social History   Social History  . Marital status: Married    Spouse name: N/A  . Number of children: N/A  . Years of education: N/A   Occupational History  . Not on file.   Social History Main Topics  . Smoking status: Former Research scientist (life sciences)  . Smokeless tobacco: Never Used     Comment: quit tobacco 2000 smoked for 20 years  . Alcohol use No  . Drug use: No  . Sexual activity: Not on file   Other Topics Concern  . 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 COLECTOMY  02/2002   sigmoid colectomy  . ELBOW SURGERY    . HERNIA REPAIR    . HIP FRACTURE SURGERY      Family History  Problem Relation Age of Onset  . Coronary artery disease Father   . Other Neg Hx        no FH od colon  cancer    No Known Allergies  Current Outpatient Prescriptions on File Prior to Visit  Medication Sig Dispense Refill  . escitalopram (LEXAPRO) 20 MG tablet TAKE 1 TABLET (20 MG TOTAL) BY MOUTH DAILY. 30 tablet 2  . gabapentin (NEURONTIN) 300 MG capsule TAKE 1 CAPSULE (300 MG TOTAL) BY MOUTH 2 (TWO) TIMES DAILY. 60 capsule 2  . traZODone (DESYREL) 150 MG tablet TAKE 1 & 1/2 TABLETS BY MOUTH AT BEDTIME 45 tablet 2   No current facility-administered medications on file prior to visit.     BP 104/86 (BP Location: Right Arm, Cuff Size: Normal)   Pulse 78   Temp 98.2 F (36.8 C) (Oral)   Resp 16   Ht 5\' 10"  (1.778 m)   Wt 162 lb 12.8 oz (73.8 kg)   SpO2 99%   BMI 23.36 kg/m    Objective:   Physical Exam  Constitutional: He appears well-developed and well-nourished. No distress.  HENT:  Head: Normocephalic and atraumatic.  Cardiovascular:  No murmur heard. Pulmonary/Chest: Breath sounds normal. No respiratory distress. He has no wheezes.  He has no rales.  Genitourinary:     Genitourinary Comments: Two small bulges noted in the right inguinal area.   Musculoskeletal: He exhibits no edema.  Neurological: He is alert.  Skin: Skin is warm and dry.  Small sebaceous cyst noted on the underside of left scrotal sac.   Small, approximately 42mm wide mobile,hard cyst like mass noted in the left axilla  Psychiatric: His behavior is normal. Thought content normal.  Appears to be under the influence of drugs.  Unusually friendly, talkative, mildly agitated.            Assessment & Plan:  Right inguinal hernias- recurrent. Will refer to general surgeon.    Provided reassurance re: cyst in left axilla and cyst on scrotum.

## 2017-10-22 ENCOUNTER — Telehealth: Payer: Self-pay | Admitting: Family

## 2017-10-22 NOTE — Telephone Encounter (Signed)
Last ov 03/20/17; last refill of Sildenafil 03/20/17, # 50/ no RF; last time Hydrocodone refilled was 12/26/16, # 15/ no refills

## 2017-10-22 NOTE — Telephone Encounter (Signed)
Copied from [place] [id number]. Topic: Quick Communication - See Telephone Encounter >> Oct 22, 2017  2:30 PM Vernona Rieger wrote: CRM for notification. See Telephone encounter for:   10/22/17.  Hydrocodone  SILDENAFIL 20mg  [facility] - [place], [place] - [address]

## 2017-10-23 MED ORDER — SILDENAFIL CITRATE 20 MG PO TABS: ORAL_TABLET | ORAL | 0 refills | Status: DC

## 2017-10-23 NOTE — Telephone Encounter (Signed)
Attempted to reach pt again and received same message.

## 2017-10-23 NOTE — Telephone Encounter (Signed)
rx sent to [facility] for sildenafil. Unable to provide any additional controlled substances.

## 2017-10-23 NOTE — Telephone Encounter (Signed)
Please see below requests.  Also, Notation made in March that UDS was positive for cocaine and no further controlled substances could be prescribed. Please advise?

## 2017-10-23 NOTE — Telephone Encounter (Signed)
Attempted to reach pt at above number and received voicemail that voice mailbox has not been set up yet. Will try again later.

## 2018-04-21 ENCOUNTER — Other Ambulatory Visit: Payer: Self-pay | Admitting: Family

## 2018-04-21 MED ORDER — GABAPENTIN 300 MG PO CAPS: ORAL_CAPSULE | ORAL | 2 refills | Status: DC

## 2018-04-21 MED ORDER — CHOLESTYRAMINE 4 G PO PACK: 4.0000 g | PACK | Freq: Two times a day (BID) | ORAL | 1 refills | Status: DC | PRN

## 2018-04-21 NOTE — Telephone Encounter (Unsigned)
Copied from [place] [id number]. Topic: Quick Communication - See Telephone Encounter >> Apr 21, 2018 10:15 AM Mylinda Latina, NT wrote: CRM for notification. See Telephone encounter for: 04/21/18. Patient called and states he needs a refill of gabapentin (NEURONTIN) 300 MG capsule. Patient states he also needs a refill of a presciption for his bowels  . He dont know the name . He only knows that it is power form . Please advise  [id number] [place] [place] - [address] AT [place] [phone] (Phone) [phone] (Fax)

## 2018-04-21 NOTE — Telephone Encounter (Signed)
Rx refill request: gabapentin 300 mg      Last ordered: 02/17/17 #60 RF 2  Rx for bowels- only "powder" Rx  in history is Questran - patient states that sound familiar- it was used to suppress bowels. He would like a Refill on that.  LOV: 03/20/17  PCP: [place] [place]

## 2018-04-21 NOTE — Telephone Encounter (Incomplete)
Gabapentin sent to [facility].

## 2019-01-09 ENCOUNTER — Other Ambulatory Visit: Payer: Self-pay | Admitting: Family

## 2019-01-10 ENCOUNTER — Telehealth: Payer: Self-pay | Admitting: Family

## 2019-01-10 NOTE — Telephone Encounter (Signed)
Received gabapentin request. Please advise pt that I will need to see him before I can provide additional refills as his last visit was in 2018.

## 2019-01-13 NOTE — Telephone Encounter (Signed)
Left detailed message explaining the reason why we are not able to prescribe his medication at this time. Advised patient to call us back to set up a virtual visit and this will allow provider to prescribe his medication.

## 2019-02-12 ENCOUNTER — Other Ambulatory Visit: Payer: Self-pay | Admitting: Family

## 2019-08-12 DIAGNOSIS — K430 Incisional hernia with obstruction, without gangrene: Secondary | ICD-10-CM

## 2019-08-12 HISTORY — DX: Incisional hernia with obstruction, without gangrene: K43.0

## 2023-02-02 DIAGNOSIS — Z85038 Personal history of other malignant neoplasm of large intestine: Secondary | ICD-10-CM | POA: Diagnosis not present

## 2023-02-02 DIAGNOSIS — R197 Diarrhea, unspecified: Secondary | ICD-10-CM | POA: Diagnosis not present

## 2023-02-07 DIAGNOSIS — R197 Diarrhea, unspecified: Secondary | ICD-10-CM | POA: Diagnosis not present

## 2023-02-10 DIAGNOSIS — I10 Essential (primary) hypertension: Secondary | ICD-10-CM | POA: Diagnosis not present

## 2023-02-10 DIAGNOSIS — Z79899 Other long term (current) drug therapy: Secondary | ICD-10-CM | POA: Diagnosis not present

## 2023-02-10 DIAGNOSIS — J309 Allergic rhinitis, unspecified: Secondary | ICD-10-CM | POA: Diagnosis not present

## 2023-02-10 DIAGNOSIS — H68003 Unspecified Eustachian salpingitis, bilateral: Secondary | ICD-10-CM | POA: Diagnosis not present

## 2023-02-10 DIAGNOSIS — G4700 Insomnia, unspecified: Secondary | ICD-10-CM | POA: Diagnosis not present

## 2023-02-10 DIAGNOSIS — Z85038 Personal history of other malignant neoplasm of large intestine: Secondary | ICD-10-CM | POA: Diagnosis not present

## 2023-02-10 DIAGNOSIS — G894 Chronic pain syndrome: Secondary | ICD-10-CM | POA: Diagnosis not present

## 2023-02-10 DIAGNOSIS — E559 Vitamin D deficiency, unspecified: Secondary | ICD-10-CM | POA: Diagnosis not present

## 2023-02-10 DIAGNOSIS — K529 Noninfective gastroenteritis and colitis, unspecified: Secondary | ICD-10-CM | POA: Diagnosis not present

## 2023-02-10 DIAGNOSIS — L989 Disorder of the skin and subcutaneous tissue, unspecified: Secondary | ICD-10-CM | POA: Diagnosis not present

## 2023-02-16 DIAGNOSIS — J309 Allergic rhinitis, unspecified: Secondary | ICD-10-CM | POA: Diagnosis not present

## 2023-02-16 DIAGNOSIS — Z85038 Personal history of other malignant neoplasm of large intestine: Secondary | ICD-10-CM | POA: Diagnosis not present

## 2023-02-16 DIAGNOSIS — Z8719 Personal history of other diseases of the digestive system: Secondary | ICD-10-CM | POA: Diagnosis not present

## 2023-02-16 DIAGNOSIS — K529 Noninfective gastroenteritis and colitis, unspecified: Secondary | ICD-10-CM | POA: Diagnosis not present

## 2023-02-16 DIAGNOSIS — M545 Low back pain, unspecified: Secondary | ICD-10-CM | POA: Diagnosis not present

## 2023-02-16 DIAGNOSIS — E559 Vitamin D deficiency, unspecified: Secondary | ICD-10-CM | POA: Diagnosis not present

## 2023-02-16 DIAGNOSIS — Z87442 Personal history of urinary calculi: Secondary | ICD-10-CM | POA: Diagnosis not present

## 2023-02-23 DIAGNOSIS — R69 Illness, unspecified: Secondary | ICD-10-CM | POA: Diagnosis not present

## 2023-02-24 DIAGNOSIS — K648 Other hemorrhoids: Secondary | ICD-10-CM | POA: Diagnosis not present

## 2023-02-24 DIAGNOSIS — Z85038 Personal history of other malignant neoplasm of large intestine: Secondary | ICD-10-CM | POA: Diagnosis not present

## 2023-02-24 DIAGNOSIS — K449 Diaphragmatic hernia without obstruction or gangrene: Secondary | ICD-10-CM | POA: Diagnosis not present

## 2023-02-24 DIAGNOSIS — R197 Diarrhea, unspecified: Secondary | ICD-10-CM | POA: Diagnosis not present

## 2023-02-24 DIAGNOSIS — K6289 Other specified diseases of anus and rectum: Secondary | ICD-10-CM | POA: Diagnosis not present

## 2023-03-12 ENCOUNTER — Encounter (HOSPITAL_COMMUNITY): Payer: Self-pay

## 2023-03-12 ENCOUNTER — Inpatient Hospital Stay (HOSPITAL_COMMUNITY)
Admission: EM | Admit: 2023-03-12 | Discharge: 2023-03-15 | DRG: 190 | Disposition: A | Discharge status: Home / Self Care | Payer: Medicare HMO | Attending: Family Medicine | Admitting: Family Medicine

## 2023-03-12 ENCOUNTER — Emergency Department (HOSPITAL_COMMUNITY): Payer: Medicare HMO

## 2023-03-12 ENCOUNTER — Other Ambulatory Visit: Payer: Self-pay

## 2023-03-12 DIAGNOSIS — H538 Other visual disturbances: Secondary | ICD-10-CM | POA: Diagnosis present

## 2023-03-12 DIAGNOSIS — R0600 Dyspnea, unspecified: Secondary | ICD-10-CM | POA: Diagnosis not present

## 2023-03-12 DIAGNOSIS — J9601 Acute respiratory failure with hypoxia: Secondary | ICD-10-CM | POA: Diagnosis present

## 2023-03-12 DIAGNOSIS — J441 Chronic obstructive pulmonary disease with (acute) exacerbation: Secondary | ICD-10-CM | POA: Diagnosis not present

## 2023-03-12 DIAGNOSIS — Z8249 Family history of ischemic heart disease and other diseases of the circulatory system: Secondary | ICD-10-CM

## 2023-03-12 DIAGNOSIS — E785 Hyperlipidemia, unspecified: Secondary | ICD-10-CM | POA: Diagnosis present

## 2023-03-12 DIAGNOSIS — Z85038 Personal history of other malignant neoplasm of large intestine: Secondary | ICD-10-CM

## 2023-03-12 DIAGNOSIS — N400 Enlarged prostate without lower urinary tract symptoms: Secondary | ICD-10-CM | POA: Diagnosis present

## 2023-03-12 DIAGNOSIS — Z720 Tobacco use: Secondary | ICD-10-CM | POA: Diagnosis present

## 2023-03-12 DIAGNOSIS — R062 Wheezing: Secondary | ICD-10-CM | POA: Diagnosis not present

## 2023-03-12 DIAGNOSIS — K5090 Crohn's disease, unspecified, without complications: Secondary | ICD-10-CM | POA: Diagnosis present

## 2023-03-12 DIAGNOSIS — F419 Anxiety disorder, unspecified: Secondary | ICD-10-CM | POA: Diagnosis present

## 2023-03-12 DIAGNOSIS — Z79899 Other long term (current) drug therapy: Secondary | ICD-10-CM

## 2023-03-12 DIAGNOSIS — R059 Cough, unspecified: Secondary | ICD-10-CM | POA: Diagnosis not present

## 2023-03-12 DIAGNOSIS — F32A Depression, unspecified: Secondary | ICD-10-CM | POA: Diagnosis present

## 2023-03-12 DIAGNOSIS — F17210 Nicotine dependence, cigarettes, uncomplicated: Secondary | ICD-10-CM | POA: Diagnosis present

## 2023-03-12 DIAGNOSIS — Z9049 Acquired absence of other specified parts of digestive tract: Secondary | ICD-10-CM

## 2023-03-12 DIAGNOSIS — R0602 Shortness of breath: Secondary | ICD-10-CM | POA: Diagnosis not present

## 2023-03-12 DIAGNOSIS — R079 Chest pain, unspecified: Secondary | ICD-10-CM | POA: Diagnosis not present

## 2023-03-12 DIAGNOSIS — R051 Acute cough: Secondary | ICD-10-CM | POA: Diagnosis not present

## 2023-03-12 HISTORY — DX: Tobacco use: Z72.0

## 2023-03-12 LAB — TROPONIN I (HIGH SENSITIVITY): Troponin I (High Sensitivity): 6 ng/L (ref <18)

## 2023-03-12 LAB — CBC WITH DIFFERENTIAL/PLATELET
Abs Immature Granulocytes: 0.04 10*3/uL (ref 0.00–0.07)
Basophils Absolute: 0 10*3/uL (ref 0.0–0.1)
Basophils Relative: 0 %
Eosinophils Absolute: 0 10*3/uL (ref 0.0–0.5)
Eosinophils Relative: 0 %
HCT: 48.4 % (ref 39.0–52.0)
Hemoglobin: 15.8 g/dL (ref 13.0–17.0)
Immature Granulocytes: 1 %
Lymphocytes Relative: 6 %
Lymphs Abs: 0.4 10*3/uL — ABNORMAL LOW (ref 0.7–4.0)
MCH: 31.8 pg (ref 26.0–34.0)
MCHC: 32.6 g/dL (ref 30.0–36.0)
MCV: 97.4 fL (ref 80.0–100.0)
Monocytes Absolute: 0.2 10*3/uL (ref 0.1–1.0)
Monocytes Relative: 2 %
Neutro Abs: 6 10*3/uL (ref 1.7–7.7)
Neutrophils Relative %: 91 %
Platelets: 183 10*3/uL (ref 150–400)
RBC: 4.97 MIL/uL (ref 4.22–5.81)
RDW: 13.7 % (ref 11.5–15.5)
WBC: 6.7 10*3/uL (ref 4.0–10.5)
nRBC: 0 % (ref 0.0–0.2)

## 2023-03-12 LAB — BASIC METABOLIC PANEL
Anion gap: 10 (ref 5–15)
BUN: 21 mg/dL (ref 8–23)
CO2: 22 mmol/L (ref 22–32)
Calcium: 8.6 mg/dL — ABNORMAL LOW (ref 8.9–10.3)
Chloride: 103 mmol/L (ref 98–111)
Creatinine, Ser: 1.03 mg/dL (ref 0.61–1.24)
GFR, Estimated: >60 mL/min (ref >60)
Glucose, Bld: 120 mg/dL — ABNORMAL HIGH (ref 70–99)
Potassium: 4.1 mmol/L (ref 3.5–5.1)
Sodium: 135 mmol/L (ref 135–145)

## 2023-03-12 LAB — BRAIN NATRIURETIC PEPTIDE: B Natriuretic Peptide: 24.7 pg/mL (ref 0.0–100.0)

## 2023-03-12 MED ORDER — BUDESONIDE 0.25 MG/2ML IN SUSP
0.2500 mg | Freq: Two times a day (BID) | RESPIRATORY_TRACT | Status: DC
  Administered 2023-03-13 – 2023-03-15 (×5): 0.25 mg via RESPIRATORY_TRACT
  Filled 2023-03-12 (×6): qty 2

## 2023-03-12 MED ORDER — NICOTINE 21 MG/24HR TD PT24
21.0000 mg | MEDICATED_PATCH | Freq: Every day | TRANSDERMAL | Status: DC
  Administered 2023-03-12 – 2023-03-15 (×4): 21 mg via TRANSDERMAL
  Filled 2023-03-12 (×4): qty 1

## 2023-03-12 MED ORDER — SENNOSIDES-DOCUSATE SODIUM 8.6-50 MG PO TABS: 1.0000 | ORAL_TABLET | Freq: Every evening | ORAL | Status: DC | PRN

## 2023-03-12 MED ORDER — METHOCARBAMOL 500 MG PO TABS
500.0000 mg | ORAL_TABLET | Freq: Three times a day (TID) | ORAL | Status: DC | PRN
  Administered 2023-03-13 – 2023-03-14 (×4): 500 mg via ORAL
  Filled 2023-03-12 (×4): qty 1

## 2023-03-12 MED ORDER — ONDANSETRON HCL 4 MG PO TABS: 4.0000 mg | ORAL_TABLET | Freq: Four times a day (QID) | ORAL | Status: DC | PRN

## 2023-03-12 MED ORDER — ONDANSETRON HCL 4 MG/2ML IJ SOLN: 4.0000 mg | Freq: Four times a day (QID) | INTRAMUSCULAR | Status: DC | PRN

## 2023-03-12 MED ORDER — IPRATROPIUM-ALBUTEROL 0.5-2.5 (3) MG/3ML IN SOLN
3.0000 mL | RESPIRATORY_TRACT | Status: AC
  Administered 2023-03-12 (×2): 3 mL via RESPIRATORY_TRACT
  Filled 2023-03-12 (×2): qty 3

## 2023-03-12 MED ORDER — MAGNESIUM SULFATE 2 GM/50ML IV SOLN
2.0000 g | Freq: Once | INTRAVENOUS | Status: AC
  Administered 2023-03-12: 2 g via INTRAVENOUS
  Filled 2023-03-12: qty 50

## 2023-03-12 MED ORDER — GUAIFENESIN ER 600 MG PO TB12
600.0000 mg | ORAL_TABLET | Freq: Two times a day (BID) | ORAL | Status: DC
  Administered 2023-03-12 – 2023-03-15 (×6): 600 mg via ORAL
  Filled 2023-03-12 (×6): qty 1

## 2023-03-12 MED ORDER — ENOXAPARIN SODIUM 40 MG/0.4ML IJ SOSY
40.0000 mg | PREFILLED_SYRINGE | INTRAMUSCULAR | Status: DC
  Administered 2023-03-13 – 2023-03-15 (×3): 40 mg via SUBCUTANEOUS
  Filled 2023-03-12 (×3): qty 0.4

## 2023-03-12 MED ORDER — METHYLPREDNISOLONE SODIUM SUCC 40 MG IJ SOLR
40.0000 mg | Freq: Two times a day (BID) | INTRAMUSCULAR | Status: DC
  Administered 2023-03-12 – 2023-03-15 (×6): 40 mg via INTRAVENOUS
  Filled 2023-03-12 (×6): qty 1

## 2023-03-12 MED ORDER — GABAPENTIN 300 MG PO CAPS
300.0000 mg | ORAL_CAPSULE | Freq: Three times a day (TID) | ORAL | Status: DC
  Administered 2023-03-12 – 2023-03-15 (×8): 300 mg via ORAL
  Filled 2023-03-12 (×8): qty 1

## 2023-03-12 MED ORDER — ALBUTEROL SULFATE (2.5 MG/3ML) 0.083% IN NEBU
10.0000 mg/h | INHALATION_SOLUTION | Freq: Once | RESPIRATORY_TRACT | Status: AC
  Administered 2023-03-12: 10 mg/h via RESPIRATORY_TRACT
  Filled 2023-03-12: qty 3

## 2023-03-12 MED ORDER — IPRATROPIUM-ALBUTEROL 0.5-2.5 (3) MG/3ML IN SOLN
3.0000 mL | Freq: Four times a day (QID) | RESPIRATORY_TRACT | Status: DC | PRN
  Administered 2023-03-13: 3 mL via RESPIRATORY_TRACT
  Filled 2023-03-12: qty 3

## 2023-03-12 MED ORDER — ARFORMOTEROL TARTRATE 15 MCG/2ML IN NEBU
15.0000 ug | INHALATION_SOLUTION | Freq: Two times a day (BID) | RESPIRATORY_TRACT | Status: DC
  Administered 2023-03-13 – 2023-03-15 (×4): 15 ug via RESPIRATORY_TRACT
  Filled 2023-03-12 (×7): qty 2

## 2023-03-12 MED ORDER — IBUPROFEN 200 MG PO TABS
400.0000 mg | ORAL_TABLET | Freq: Four times a day (QID) | ORAL | Status: DC | PRN
  Administered 2023-03-13 – 2023-03-14 (×3): 400 mg via ORAL
  Filled 2023-03-12 (×3): qty 2

## 2023-03-12 NOTE — H&P (Signed)
History and Physical    Justin [name] ZOX:[id number] DOB: [birth year] DOA: 03/12/2023  PCP: Pcp, No  Patient coming from: Home  I have personally briefly reviewed patient's old medical records in [facility]  Chief Complaint: Shortness of breath  HPI: Justin [name] is a 67 y.o. male with medical history significant for colon cancer s/p repair sigmoid colectomy, depression/anxiety, tobacco use who presented to the ED for evaluation of shortness of breath.  Patient reports 2 weeks of progressive shortness of breath.  Over the last 48 hours dyspnea has worsened and occurring at rest and worse when he lays flat.  He has had cough with minimal white sputum production.  He reports right rib pain due to frequent cough.  He denies any chest pain, fevers, chills, diaphoresis.  He has been using an albuterol inhaler at home frequently without significant relief.  He reports that he started back smoking 3 months ago, currently at half pack per day.  He had quit for many years, in the past was smoking 1 PPD for about 7 years.  Patient went to urgent care in [place] earlier today for evaluation.  He was given IV and Solu-Medrol 125 mg and nebulizer treatments.  Patient states while driving afterwards he developed transient blurry vision in both eyes lasting about 20 seconds and now back to baseline.  No other associated symptoms at the time.  Patient states he was recently diagnosed with Crohn's disease and was started on steroids by his GI specialist with [facility].  ED Course  Labs/Imaging on admission: I have personally reviewed following labs and imaging studies.  Initial vitals showed BP 159/104, pulse 92, RR 18, temp 98.4 F, SpO2 93% on room air.  Labs show WBC 6.7, hemoglobin 15.8, platelets 183,000, sodium 135, potassium 4.1, bicarb 22, BUN 21, creatinine 1.03, serum glucose 120, troponin 6, BNP 24.7.  2 view chest x-ray negative for focal consolidation, edema,  effusion.  Bronchitic changes at the bases noted.  Patient was given IV magnesium 2 g, DuoNeb treatment followed by continuous albuterol treatment.  The hospitalist service was consulted to admit for further evaluation and management.  Review of Systems: All systems reviewed and are negative except as documented in history of present illness above.   Past Medical History:  Diagnosis Date   Anxiety    takes approximately 10 diazepam/month-contracts to no more than that amount   BPH (benign prostatic hypertrophy)    sees urology   Colon cancer [facility]) 2003   specialist due for repeat colonoscopy in 2012   Depression    History of kidney stones    Hyperlipidemia    Insomnia    Multiple fractures 08/2008   patient fell off a rood about 13 feet onto concrete-multiple fractures including vertrebral   Overactive bladder     Past Surgical History:  Procedure Laterality Date   COLECTOMY  02/2002   sigmoid colectomy   ELBOW SURGERY     HERNIA REPAIR     HIP FRACTURE SURGERY      Social History:  reports that he has quit smoking. He has never used smokeless tobacco. He reports that he does not drink alcohol and does not use drugs.  No Known Allergies  Family History  Problem Relation Age of Onset   Coronary artery disease Father    Other Neg Hx        no FH od colon cancer     Prior to Admission medications   Medication  Sig Start Date End Date Taking? Authorizing Provider  cholestyramine (QUESTRAN) 4 g packet TAKE 1 PACKET (4 G TOTAL) BY MOUTH 2 (TWO) TIMES DAILY AS NEEDED. FOR DIARRHEA 04/22/18   Sandford Craze, NP  escitalopram (LEXAPRO) 20 MG tablet TAKE 1 TABLET (20 MG TOTAL) BY MOUTH DAILY. 02/17/17   Sandford Craze, NP  gabapentin (NEURONTIN) 300 MG capsule TAKE 1 CAPSULE (300 MG TOTAL) BY MOUTH 2 (TWO) TIMES DAILY. 04/21/18   Sandford Craze, NP  sildenafil (REVATIO) 20 MG tablet 1-2 tabs by mouth prior ot sexual activity. 10/23/17   Sandford Craze, NP   traZODone (DESYREL) 150 MG tablet TAKE 1 & 1/2 TABLETS BY MOUTH AT BEDTIME 02/17/17   Sandford Craze, NP    Physical Exam: Vitals:   03/12/23 1935 03/12/23 2015 03/12/23 2030 03/12/23 2045  BP:  132/88 (!) 145/84 (!) 146/83  Pulse:  81 81 82  Resp:  15 20 19   Temp:      TempSrc:      SpO2:  94% 96% 96%  Weight: 81.6 kg     Height: 5\' 11"  (1.803 m)      Constitutional: Resting in bed, NAD, calm, comfortable Eyes: EOMI, lids and conjunctivae normal ENMT: Mucous membranes are moist. Posterior pharynx clear of any exudate or lesions.Normal dentition.  Neck: normal, supple, no masses. Respiratory: Coarse expiratory wheezing throughout the lung fields. Normal respiratory effort. No accessory muscle use.  Cardiovascular: Regular rate and rhythm, no murmurs / rubs / gallops. No extremity edema. 2+ pedal pulses. Abdomen: no tenderness, no masses palpated. Musculoskeletal: no clubbing / cyanosis. No joint deformity upper and lower extremities. Good ROM, no contractures. Normal muscle tone.  Skin: no rashes, lesions, ulcers. No induration Neurologic: Sensation intact. Strength 5/5 in all 4.  Psychiatric: Alert and oriented x 3. Normal mood.   EKG: Personally reviewed. Sinus rhythm, rate 83, LAFB, no acute ischemic changes.  Assessment/Plan Principal Problem:   COPD with acute exacerbation (HCC) Active Problems:   Tobacco use   Justin [name] is a 67 y.o. male with medical history significant for colon cancer s/p repair sigmoid colectomy, depression/anxiety, tobacco use who is admitted with COPD exacerbation.  Assessment and Plan: * COPD with acute exacerbation (HCC) Significant wheezing on admission.  Suspect underlying undiagnosed COPD exacerbated due to smoking again.  CXR negative for evidence of pneumonia. -IV Solu-Medrol 40 mg twice daily -Start Brovana/Pulmicort BID -DuoNeb as needed -Supplemental O2 as needed  Tobacco use Started smoking again about 3 months ago,  currently 0.5 PPD.  Smoking cessation advised.  Nicotine patch ordered.  DVT prophylaxis: enoxaparin (LOVENOX) injection 40 mg Start: 03/13/23 1000 Code Status: Full code, confirmed with patient on admission Family Communication: Brother at bedside Disposition Plan: From home and likely discharge to home pending clinical progress Consults called: None Severity of Illness: The appropriate patient status for this patient is OBSERVATION. Observation status is judged to be reasonable and necessary in order to provide the required intensity of service to ensure the patient's safety. The patient's presenting symptoms, physical exam findings, and initial radiographic and laboratory data in the context of their medical condition is felt to place them at decreased risk for further clinical deterioration. Furthermore, it is anticipated that the patient will be medically stable for discharge from the hospital within 2 midnights of admission.   Darreld Mclean MD [facility]  If 7PM-7AM, please contact night-coverage [email or web address]  03/12/2023, 10:27 PM

## 2023-03-12 NOTE — Assessment & Plan Note (Signed)
Significant wheezing on admission.  Suspect underlying undiagnosed COPD exacerbated due to smoking again.  CXR negative for evidence of pneumonia. -IV Solu-Medrol 40 mg twice daily -Start Brovana/Pulmicort BID -DuoNeb as needed -Supplemental O2 as needed

## 2023-03-12 NOTE — ED Triage Notes (Signed)
Patient reports SOB for the past few days along with some congestion. States he went to urgent care and got 2 breathing treatments and chest XR. States he will fall asleep and then wake up gasping for air and had to leave work early today due to feeling unwell. States while driving here his vision went blurry for about twenty seconds.

## 2023-03-12 NOTE — Hospital Course (Signed)
Justin [name] is a 67 y.o. male with medical history significant for colon cancer s/p repair sigmoid colectomy, depression/anxiety, tobacco use who is admitted with COPD exacerbation.

## 2023-03-12 NOTE — ED Notes (Signed)
ED TO INPATIENT HANDOFF REPORT  ED Nurse Name and Phone #: Belicia Difatta  S Name/Age/Gender Justin [name] 67 y.o. male Room/Bed: WA03/WA03  Code Status   Code Status: Full Code  Home/SNF/Other Home Patient oriented to: self, place, time, and situation Is this baseline? Yes   Triage Complete: Triage complete  Chief Complaint COPD with acute exacerbation [facility]) [J44.1]  Triage Note Patient reports SOB for the past few days along with some congestion. States he went to urgent care and got 2 breathing treatments and chest XR. States he will fall asleep and then wake up gasping for air and had to leave work early today due to feeling unwell. States while driving here his vision went blurry for about twenty seconds.   Allergies No Known Allergies  Level of Care/Admitting Diagnosis ED Disposition     ED Disposition  Admit   Condition  --   Comment  Hospital Area: [facility] [[id number]]  Level of Care: Med-Surg [16]  May place patient in observation at [facility] or [facility] if equivalent level of care is available:: No  Covid Evaluation: Asymptomatic - no recent exposure (last 10 days) testing not required  Diagnosis: COPD with acute exacerbation [facility]) [[id number]]  Admitting Physician: Charlsie Quest [[id number]]  Attending Physician: Charlsie Quest [[id number]]          B Medical/Surgery History Past Medical History:  Diagnosis Date   Anxiety    takes approximately 10 diazepam/month-contracts to no more than that amount   BPH (benign prostatic hypertrophy)    sees urology   Colon cancer [facility]) 2003   specialist due for repeat colonoscopy in 2012   Depression    History of kidney stones    Hyperlipidemia    Insomnia    Multiple fractures 08/2008   patient fell off a rood about 13 feet onto concrete-multiple fractures including vertrebral   Overactive bladder    Past Surgical History:  Procedure Laterality Date   COLECTOMY  02/2002   sigmoid  colectomy   ELBOW SURGERY     HERNIA REPAIR     HIP FRACTURE SURGERY       A IV Location/Drains/Wounds Patient Lines/Drains/Airways Status     Active Line/Drains/Airways     Name Placement date Placement time Site Days   Peripheral IV 03/12/23 20 G 1" Anterior;Proximal;Right Forearm 03/12/23  2010  Forearm  less than 1            Intake/Output Last 24 hours  Intake/Output Summary (Last 24 hours) at 03/12/2023 2235 Last data filed at 03/12/2023 2047 Gross per 24 hour  Intake 50 ml  Output --  Net 50 ml    Labs/Imaging Results for orders placed or performed during the hospital encounter of 03/12/23 (from the past 48 hour(s))  CBC with Differential     Status: Abnormal   Collection Time: 03/12/23  8:06 PM  Result Value Ref Range   WBC 6.7 4.0 - 10.5 K/uL   RBC 4.97 4.22 - 5.81 MIL/uL   Hemoglobin 15.8 13.0 - 17.0 g/dL   HCT 29.5 62.1 - 30.8 %   MCV 97.4 80.0 - 100.0 fL   MCH 31.8 26.0 - 34.0 pg   MCHC 32.6 30.0 - 36.0 g/dL   RDW 65.7 84.6 - 96.2 %   Platelets 183 150 - 400 K/uL   nRBC 0.0 0.0 - 0.2 %   Neutrophils Relative % 91 %   Neutro Abs 6.0 1.7 - 7.7 K/uL  Lymphocytes Relative 6 %   Lymphs Abs 0.4 (L) 0.7 - 4.0 K/uL   Monocytes Relative 2 %   Monocytes Absolute 0.2 0.1 - 1.0 K/uL   Eosinophils Relative 0 %   Eosinophils Absolute 0.0 0.0 - 0.5 K/uL   Basophils Relative 0 %   Basophils Absolute 0.0 0.0 - 0.1 K/uL   Immature Granulocytes 1 %   Abs Immature Granulocytes 0.04 0.00 - 0.07 K/uL    Comment: Performed at [facility], [address], [place], [place] [address]  Basic metabolic panel     Status: Abnormal   Collection Time: 03/12/23  8:06 PM  Result Value Ref Range   Sodium 135 135 - 145 mmol/L   Potassium 4.1 3.5 - 5.1 mmol/L   Chloride 103 98 - 111 mmol/L   CO2 22 22 - 32 mmol/L   Glucose, Bld 120 (H) 70 - 99 mg/dL    Comment: Glucose reference range applies only to samples taken after fasting for at least 8 hours.   BUN  21 8 - 23 mg/dL   Creatinine, Ser 4.09 0.61 - 1.24 mg/dL   Calcium 8.6 (L) 8.9 - 10.3 mg/dL   GFR, Estimated >81 >19 mL/min    Comment: (NOTE) Calculated using the CKD-EPI Creatinine Equation (2021)    Anion gap 10 5 - 15    Comment: Performed at [facility], [address], [place], [place] [address]  Troponin I (High Sensitivity)     Status: None   Collection Time: 03/12/23  8:06 PM  Result Value Ref Range   Troponin I (High Sensitivity) 6 <18 ng/L    Comment: (NOTE) Elevated high sensitivity troponin I (hsTnI) values and significant  changes across serial measurements may suggest ACS but many other  chronic and acute conditions are known to elevate hsTnI results.  Refer to the "Links" section for chest pain algorithms and additional  guidance. Performed at [facility], [address], [place], [place] [address]   Brain natriuretic peptide     Status: None   Collection Time: 03/12/23  8:06 PM  Result Value Ref Range   B Natriuretic Peptide 24.7 0.0 - 100.0 pg/mL    Comment: Performed at [facility], [address], [place], [place] [address]   DG Chest 2 View  Result Date: 03/12/2023 CLINICAL DATA:  Cough congestion EXAM: CHEST - 2 VIEW COMPARISON:  06/29/2012 FINDINGS: Bronchitic changes at the bases. Chronic right CP angle scarring. No consolidation or pleural effusion. Stable cardiomediastinal silhouette. No pneumothorax IMPRESSION: No active cardiopulmonary disease. Bronchitic changes at the bases. Electronically Signed   By: Jasmine Pang M.D.   On: 03/12/2023 21:58    Pending Labs Unresulted Labs (From admission, onward)     Start     Ordered   03/13/23 0500  HIV Antibody (routine testing w rflx)  (HIV Antibody (Routine testing w reflex) panel)  Tomorrow morning,   R        03/12/23 2218   03/13/23 0500  CBC  Tomorrow morning,   R        03/12/23 2218   03/13/23 0500  Basic metabolic panel  Tomorrow morning,    R        03/12/23 2218            Vitals/Pain Today's Vitals   03/12/23 2010 03/12/23 2015 03/12/23 2030 03/12/23 2045  BP:  132/88 (!) 145/84 (!) 146/83  Pulse:  81 81 82  Resp:  15 20 19   Temp:      TempSrc:      SpO2:  94% 96% 96%  Weight:      Height:      PainSc: 6        Isolation Precautions No active isolations  Medications Medications  enoxaparin (LOVENOX) injection 40 mg (has no administration in time range)  arformoterol (BROVANA) nebulizer solution 15 mcg (has no administration in time range)  budesonide (PULMICORT) nebulizer solution 0.25 mg (has no administration in time range)  ondansetron (ZOFRAN) tablet 4 mg (has no administration in time range)    Or  ondansetron (ZOFRAN) injection 4 mg (has no administration in time range)  senna-docusate (Senokot-S) tablet 1 tablet (has no administration in time range)  ibuprofen (ADVIL) tablet 400 mg (has no administration in time range)  methocarbamol (ROBAXIN) tablet 500 mg (has no administration in time range)  gabapentin (NEURONTIN) capsule 300 mg (has no administration in time range)  guaiFENesin (MUCINEX) 12 hr tablet 600 mg (has no administration in time range)  nicotine (NICODERM CQ - dosed in mg/24 hours) patch 21 mg (has no administration in time range)  methylPREDNISolone sodium succinate (SOLU-MEDROL) 40 mg/mL injection 40 mg (has no administration in time range)  ipratropium-albuterol (DUONEB) 0.5-2.5 (3) MG/3ML nebulizer solution 3 mL (has no administration in time range)  ipratropium-albuterol (DUONEB) 0.5-2.5 (3) MG/3ML nebulizer solution 3 mL (3 mLs Nebulization Given 03/12/23 2012)  magnesium sulfate IVPB 2 g 50 mL (0 g Intravenous Stopped 03/12/23 2047)  albuterol (PROVENTIL) (2.5 MG/3ML) 0.083% nebulizer solution (10 mg/hr Nebulization Given 03/12/23 2154)    Mobility walks     Focused Assessments Pulmonary Assessment Handoff:  Lung sounds: Bilateral Breath Sounds: Expiratory wheezes,  Inspiratory wheezes L Breath Sounds: Expiratory wheezes, Inspiratory wheezes R Breath Sounds: Inspiratory wheezes, Expiratory wheezes O2 Device: Room Air      R Recommendations: See Admitting Provider Note  Report given to:   Additional Notes:

## 2023-03-12 NOTE — ED Provider Notes (Signed)
[place] EMERGENCY DEPARTMENT AT [facility] Provider Note   CSN: [id number] Arrival date & time: 03/12/23  1927     History  Chief Complaint  Patient presents with   Shortness of Breath    Justin [name] is a 67 y.o. male.  67 yo M with a chief complaints of difficulty breathing.  This been going on for a few days now.  A lot worse when he lies back flat.  Is worse trying to get up and move around as well.  Has been coughing quite a bit with it.  Has a history of lung disease and started back smoking a couple months ago.  No fevers.  No chest pain.  He went to a urgent care this morning and received 2 nebulizer treatments and had an x-ray that was reportedly unremarkable.  He was then driving down the road and felt like he had had a change to his vision that lasted for about 20 seconds.  He felt like he could still see a little bit out of the periphery and then it has completely resolved.  He denies any eye pain with this.  Felt like it was in both eyes.  Denies headache or neck pain.   Shortness of Breath      Home Medications Prior to Admission medications   Medication Sig Start Date End Date Taking? Authorizing Provider  cholestyramine (QUESTRAN) 4 g packet TAKE 1 PACKET (4 G TOTAL) BY MOUTH 2 (TWO) TIMES DAILY AS NEEDED. FOR DIARRHEA 04/22/18   Sandford Craze, NP  escitalopram (LEXAPRO) 20 MG tablet TAKE 1 TABLET (20 MG TOTAL) BY MOUTH DAILY. 02/17/17   Sandford Craze, NP  gabapentin (NEURONTIN) 300 MG capsule TAKE 1 CAPSULE (300 MG TOTAL) BY MOUTH 2 (TWO) TIMES DAILY. 04/21/18   Sandford Craze, NP  sildenafil (REVATIO) 20 MG tablet 1-2 tabs by mouth prior ot sexual activity. 10/23/17   Sandford Craze, NP  traZODone (DESYREL) 150 MG tablet TAKE 1 & 1/2 TABLETS BY MOUTH AT BEDTIME 02/17/17   Sandford Craze, NP      Allergies    Patient has no known allergies.    Review of Systems   Review of Systems  Respiratory:  Positive for shortness of  breath.     Physical Exam Updated Vital Signs BP (!) 159/104 (BP Location: Right Arm)   Pulse 91   Temp 98.4 F (36.9 C) (Oral)   Resp 18   Ht 5\' 11"  (1.803 m)   Wt 81.6 kg   SpO2 93%   BMI 25.10 kg/m  Physical Exam Vitals and nursing note reviewed.  Constitutional:      Appearance: He is well-developed.  HENT:     Head: Normocephalic and atraumatic.  Eyes:     Pupils: Pupils are equal, round, and reactive to light.  Neck:     Vascular: No JVD.  Cardiovascular:     Rate and Rhythm: Normal rate and regular rhythm.     Heart sounds: No murmur heard.    No friction rub. No gallop.  Pulmonary:     Effort: No respiratory distress.     Breath sounds: Wheezing present.     Comments: Coarse breath sounds in all lung fields with prolonged expiratory effort. Abdominal:     General: There is no distension.     Tenderness: There is no abdominal tenderness. There is no guarding or rebound.  Musculoskeletal:        General: Normal range of motion.  Cervical back: Normal range of motion and neck supple.  Skin:    Coloration: Skin is not pale.     Findings: No rash.  Neurological:     Mental Status: He is alert and oriented to person, place, and time.  Psychiatric:        Behavior: Behavior normal.     ED Results / Procedures / Treatments   Labs (all labs ordered are listed, but only abnormal results are displayed) Labs Reviewed  CBC WITH DIFFERENTIAL/PLATELET  BASIC METABOLIC PANEL  BRAIN NATRIURETIC PEPTIDE  TROPONIN I (HIGH SENSITIVITY)    EKG None  Radiology No results found.  Procedures .Critical Care  Performed by: Melene Plan, DO Authorized by: Melene Plan, DO   Critical care provider statement:    Critical care time (minutes):  35   Critical care time was exclusive of:  Separately billable procedures and treating other patients   Critical care was time spent personally by me on the following activities:  Development of treatment plan with patient or  surrogate, discussions with consultants, evaluation of patient's response to treatment, examination of patient, ordering and review of laboratory studies, ordering and review of radiographic studies, ordering and performing treatments and interventions, pulse oximetry, re-evaluation of patient's condition and review of old charts   Care discussed with: admitting provider       Medications Ordered in ED Medications  ipratropium-albuterol (DUONEB) 0.5-2.5 (3) MG/3ML nebulizer solution 3 mL (has no administration in time range)  magnesium sulfate IVPB 2 g 50 mL (has no administration in time range)    ED Course/ Medical Decision Making/ A&P                             Medical Decision Making Amount and/or Complexity of Data Reviewed Labs: ordered.  Risk Prescription drug management.   67 yo M with a chief complaint of difficulty breathing.  This been going on for about 3 days now.  He is describing what sounds like orthopnea.  He denies any chest pain.  Lung exam much more consistent with COPD exacerbation.  On my record review the patient had been seen in urgent care center had an x-ray that was read as negative for pneumonia and had a DuoNeb and a albuterol treatment.  Will give 2 more DuoNebs here.  Blood work.  Reassess.  Patient does not feel like he has had any improvement status post 2 DuoNeb's.  Feels like he needs to stay in the hospital.  Patient still mildly tachypneic.  Still with wheezes diffusely.  Will start on a continuous.  Will discuss with medicine.  The patients results and plan were reviewed and discussed.   Any x-rays performed were independently reviewed by myself.   Differential diagnosis were considered with the presenting HPI.  Medications  ipratropium-albuterol (DUONEB) 0.5-2.5 (3) MG/3ML nebulizer solution 3 mL (3 mLs Nebulization Given 03/12/23 2012)  magnesium sulfate IVPB 2 g 50 mL (0 g Intravenous Stopped 03/12/23 2047)  albuterol (PROVENTIL) (2.5 MG/3ML)  0.083% nebulizer solution (10 mg/hr Nebulization Given 03/12/23 2154)    Vitals:   03/12/23 1935 03/12/23 2015 03/12/23 2030 03/12/23 2045  BP:  132/88 (!) 145/84 (!) 146/83  Pulse:  81 81 82  Resp:  15 20 19   Temp:      TempSrc:      SpO2:  94% 96% 96%  Weight: 81.6 kg     Height: 5\' 11"  (1.803 m)  Final diagnoses:  COPD exacerbation (HCC)    Admission/ observation were discussed with the admitting physician, patient and/or family and they are comfortable with the plan.          Final Clinical Impression(s) / ED Diagnoses Final diagnoses:  None    Rx / DC Orders ED Discharge Orders     None         Melene Plan, DO 03/12/23 2157

## 2023-03-12 NOTE — Assessment & Plan Note (Signed)
Started smoking again about 3 months ago, currently 0.5 PPD.  Smoking cessation advised.  Nicotine patch ordered.

## 2023-03-13 DIAGNOSIS — F32A Depression, unspecified: Secondary | ICD-10-CM | POA: Diagnosis not present

## 2023-03-13 DIAGNOSIS — E785 Hyperlipidemia, unspecified: Secondary | ICD-10-CM | POA: Diagnosis not present

## 2023-03-13 DIAGNOSIS — F17210 Nicotine dependence, cigarettes, uncomplicated: Secondary | ICD-10-CM | POA: Diagnosis not present

## 2023-03-13 DIAGNOSIS — Z9049 Acquired absence of other specified parts of digestive tract: Secondary | ICD-10-CM | POA: Diagnosis not present

## 2023-03-13 DIAGNOSIS — N400 Enlarged prostate without lower urinary tract symptoms: Secondary | ICD-10-CM | POA: Diagnosis not present

## 2023-03-13 DIAGNOSIS — J9601 Acute respiratory failure with hypoxia: Secondary | ICD-10-CM | POA: Diagnosis not present

## 2023-03-13 DIAGNOSIS — Z79899 Other long term (current) drug therapy: Secondary | ICD-10-CM | POA: Diagnosis not present

## 2023-03-13 DIAGNOSIS — F419 Anxiety disorder, unspecified: Secondary | ICD-10-CM | POA: Diagnosis not present

## 2023-03-13 DIAGNOSIS — Z8249 Family history of ischemic heart disease and other diseases of the circulatory system: Secondary | ICD-10-CM | POA: Diagnosis not present

## 2023-03-13 DIAGNOSIS — Z85038 Personal history of other malignant neoplasm of large intestine: Secondary | ICD-10-CM | POA: Diagnosis not present

## 2023-03-13 DIAGNOSIS — K5090 Crohn's disease, unspecified, without complications: Secondary | ICD-10-CM | POA: Diagnosis not present

## 2023-03-13 DIAGNOSIS — J441 Chronic obstructive pulmonary disease with (acute) exacerbation: Secondary | ICD-10-CM | POA: Diagnosis not present

## 2023-03-13 DIAGNOSIS — H538 Other visual disturbances: Secondary | ICD-10-CM | POA: Diagnosis not present

## 2023-03-13 LAB — BASIC METABOLIC PANEL
Anion gap: 8 (ref 5–15)
BUN: 23 mg/dL (ref 8–23)
CO2: 24 mmol/L (ref 22–32)
Calcium: 8.4 mg/dL — ABNORMAL LOW (ref 8.9–10.3)
Chloride: 103 mmol/L (ref 98–111)
Creatinine, Ser: 0.88 mg/dL (ref 0.61–1.24)
GFR, Estimated: >60 mL/min (ref >60)
Glucose, Bld: 196 mg/dL — ABNORMAL HIGH (ref 70–99)
Potassium: 4.2 mmol/L (ref 3.5–5.1)
Sodium: 135 mmol/L (ref 135–145)

## 2023-03-13 LAB — CBC
HCT: 45 % (ref 39.0–52.0)
Hemoglobin: 14.4 g/dL (ref 13.0–17.0)
MCH: 31.2 pg (ref 26.0–34.0)
MCHC: 32 g/dL (ref 30.0–36.0)
MCV: 97.6 fL (ref 80.0–100.0)
Platelets: 162 10*3/uL (ref 150–400)
RBC: 4.61 MIL/uL (ref 4.22–5.81)
RDW: 13.6 % (ref 11.5–15.5)
WBC: 4.3 10*3/uL (ref 4.0–10.5)
nRBC: 0 % (ref 0.0–0.2)

## 2023-03-13 LAB — HIV ANTIBODY (ROUTINE TESTING W REFLEX): HIV Screen 4th Generation wRfx: NONREACTIVE

## 2023-03-13 MED ORDER — LOPERAMIDE HCL 2 MG PO CAPS
4.0000 mg | ORAL_CAPSULE | ORAL | Status: DC | PRN
  Administered 2023-03-13: 4 mg via ORAL
  Filled 2023-03-13: qty 2

## 2023-03-13 MED ORDER — HYDROCOD POLI-CHLORPHE POLI ER 10-8 MG/5ML PO SUER
5.0000 mL | Freq: Two times a day (BID) | ORAL | Status: DC | PRN
  Administered 2023-03-13 – 2023-03-14 (×2): 5 mL via ORAL
  Filled 2023-03-13 (×2): qty 5

## 2023-03-13 MED ORDER — OXYCODONE-ACETAMINOPHEN 5-325 MG PO TABS
1.0000 | ORAL_TABLET | Freq: Three times a day (TID) | ORAL | Status: DC | PRN
  Administered 2023-03-13 – 2023-03-15 (×6): 1 via ORAL
  Filled 2023-03-13 (×6): qty 1

## 2023-03-13 MED ORDER — ALUM & MAG HYDROXIDE-SIMETH 200-200-20 MG/5ML PO SUSP
15.0000 mL | Freq: Four times a day (QID) | ORAL | Status: DC | PRN
  Administered 2023-03-13: 15 mL via ORAL
  Filled 2023-03-13: qty 30

## 2023-03-13 MED ORDER — PANTOPRAZOLE SODIUM 40 MG PO TBEC
40.0000 mg | DELAYED_RELEASE_TABLET | Freq: Every day | ORAL | Status: DC
  Administered 2023-03-13 – 2023-03-15 (×3): 40 mg via ORAL
  Filled 2023-03-13 (×3): qty 1

## 2023-03-13 NOTE — Progress Notes (Signed)
SATURATION QUALIFICATIONS: (This note is used to comply with regulatory documentation for home oxygen)  Patient Saturations on Room Air at Rest = 90%  Patient Saturations on Room Air while Ambulating = 87%  Patient Saturations on 2 Liters of oxygen while Ambulating = 92%  Please briefly explain why patient needs home oxygen: Pt would benefit from Oxygen because pt desats while ambulating.

## 2023-03-13 NOTE — Progress Notes (Signed)
PROGRESS NOTE    Justin [name]  NFA:[id number] DOB: 04-29-1956 DOA: 03/12/2023 PCP: Pcp, No   Brief Narrative:  This 67 yrs old Male with medical history significant for colon cancer s/p repair , sigmoid colectomy, depression / anxiety, tobacco use who presented to the ED for evaluation of shortness of breath.  Patient reports progressive shortness of breath for last 2 weeks which has worsened to the point when he coughs it causes rib pain he has been using his regular inhaler without any significant relief.  Patient also reports shortness of breath while lying down, even at rest.  He started back on his smoking 3 months ago, had quit smoking for many years.  He went to urgent care in age per, he was given IV Solu-Medrol and nebulization treatment.  Patient was discharged and while going home he has developed blurry vision lasting 20 seconds and now back to baseline.  Patient was also recently diagnosed with Crohn's disease and was started on steroids by his GI specialist.  Workup in the ED reveals chest x-ray negative for focal consolidation bronchitic changes noted.  Patient was given IV magnesium DuoNeb treatment and IV steroids.  Admitted for COPD exacerbation.  Assessment & Plan:   Principal Problem:   COPD with acute exacerbation (HCC) Active Problems:   Tobacco use  Acute hypoxic respiratory failure: COPD with acute exacerbation: Patient presented in the ED with worsening shortness of breath, found to have significant wheezing on exam. Suspect underlying undiagnosed COPD presented with signs and symptoms consistent with COPD exacerbation. Continue scheduled and as needed nebulized bronchodilators , Continue IV Solu-Medrol. Continue supplemental oxygen to keep saturation above 92%. Exacerbated by recent smoking. Chest x-ray negative for evidence of pneumonia. Continue IV Solu-Medrol 40 mg every 12 hours. Continue with scheduled and as needed nebulized bronchodilators. Continue  supplemental oxygen and wean as tolerated. Started on Brovana/Pulmicort twice daily.  Tobacco use. Patient started smoking again about 3 months ago. Smoking cessation advised.  Nicotine patch ordered.  DVT prophylaxis: Lovenox Code Status:Full code Family Communication: No family at bed side. Disposition Plan:    Status is: Observation The patient remains OBS appropriate and will d/c before 2 midnights.    Consultants:  None  Procedures: None Antimicrobials:  Anti-infectives (From admission, onward)    None      Subjective: Patient was seen and examined at bedside.  Overnight events noted.   Patient still reports having wheezing and shortness of breath , requiring 2 L of supplemental oxygen.  Objective: Vitals:   03/12/23 2230 03/12/23 2254 03/13/23 0221 03/13/23 0900  BP:   (!) 149/89   Pulse: 84  82   Resp: 19  20   Temp:  98.3 F (36.8 C) 97.9 F (36.6 C)   TempSrc:  Oral Oral   SpO2: 91%  93% 94%  Weight:      Height:        Intake/Output Summary (Last 24 hours) at 03/13/2023 1221 Last data filed at 03/13/2023 1022 Gross per 24 hour  Intake 290 ml  Output --  Net 290 ml   Filed Weights   03/12/23 1935  Weight: 81.6 kg    Examination:  General exam: Appears comfortable, not in any acute distress, deconditioned. Respiratory system: Wheezing bilaterally. Respiratory effort normal.  RR 16 Cardiovascular system: S1 & S2 heard, regular rate and rhythm, no murmur. Gastrointestinal system: Abdomen is soft, non tender, non distended, BS+ Central nervous system: Alert and oriented X 3. No focal neurological  deficits. Extremities: No edema, no cyanosis, no clubbing Skin: No rashes, lesions or ulcers Psychiatry: Judgement and insight appear normal. Mood & affect appropriate.   Data Reviewed: I have personally reviewed following labs and imaging studies  CBC: Recent Labs  Lab 03/12/23 2006 03/13/23 0548  WBC 6.7 4.3  NEUTROABS 6.0  --   HGB 15.8 14.4   HCT 48.4 45.0  MCV 97.4 97.6  PLT 183 162   Basic Metabolic Panel: Recent Labs  Lab 03/12/23 2006 03/13/23 0548  NA 135 135  K 4.1 4.2  CL 103 103  CO2 22 24  GLUCOSE 120* 196*  BUN 21 23  CREATININE 1.03 0.88  CALCIUM 8.6* 8.4*   GFR: Estimated Creatinine Clearance: 85.6 mL/min (by C-G formula based on SCr of 0.88 mg/dL). Liver Function Tests: No results for input(s): "AST", "ALT", "ALKPHOS", "BILITOT", "PROT", "ALBUMIN" in the last 168 hours. No results for input(s): "LIPASE", "AMYLASE" in the last 168 hours. No results for input(s): "AMMONIA" in the last 168 hours. Coagulation Profile: No results for input(s): "INR", "PROTIME" in the last 168 hours. Cardiac Enzymes: No results for input(s): "CKTOTAL", "CKMB", "CKMBINDEX", "TROPONINI" in the last 168 hours. BNP (last 3 results) No results for input(s): "PROBNP" in the last 8760 hours. HbA1C: No results for input(s): "HGBA1C" in the last 72 hours. CBG: No results for input(s): "GLUCAP" in the last 168 hours. Lipid Profile: No results for input(s): "CHOL", "HDL", "LDLCALC", "TRIG", "CHOLHDL", "LDLDIRECT" in the last 72 hours. Thyroid Function Tests: No results for input(s): "TSH", "T4TOTAL", "FREET4", "T3FREE", "THYROIDAB" in the last 72 hours. Anemia Panel: No results for input(s): "VITAMINB12", "FOLATE", "FERRITIN", "TIBC", "IRON", "RETICCTPCT" in the last 72 hours. Sepsis Labs: No results for input(s): "PROCALCITON", "LATICACIDVEN" in the last 168 hours.  No results found for this or any previous visit (from the past 240 hour(s)).    Radiology Studies: DG Chest 2 View  Result Date: 03/12/2023 CLINICAL DATA:  Cough congestion EXAM: CHEST - 2 VIEW COMPARISON:  06/29/2012 FINDINGS: Bronchitic changes at the bases. Chronic right CP angle scarring. No consolidation or pleural effusion. Stable cardiomediastinal silhouette. No pneumothorax IMPRESSION: No active cardiopulmonary disease. Bronchitic changes at the bases.  Electronically Signed   By: Jasmine Pang M.D.   On: 03/12/2023 21:58    Scheduled Meds:  arformoterol  15 mcg Nebulization BID   budesonide (PULMICORT) nebulizer solution  0.25 mg Nebulization BID   enoxaparin (LOVENOX) injection  40 mg Subcutaneous Q24H   gabapentin  300 mg Oral TID   guaiFENesin  600 mg Oral BID   methylPREDNISolone (SOLU-MEDROL) injection  40 mg Intravenous BID   nicotine  21 mg Transdermal Daily   pantoprazole  40 mg Oral Daily   Continuous Infusions:   LOS: 0 days    Time spent: 50 mins    Willeen Niece, MD [facility]   If 7PM-7AM, please contact night-coverage

## 2023-03-13 NOTE — TOC Progression Note (Signed)
Transition of Care [facility]) - Progression Note    Patient Details  Name: Justin [name] MRN: [id number] Date of Birth: 1956-01-03  Transition [facility] Care [facility]) CM/SW Contact  Beckie Busing, RN Phone Number:[phone]  03/13/2023, 3:54 PM  Clinical Narrative:     Transition of Care [facility]) Screening Note   Patient Details  Name: Justin [name] Date of Birth: 09-08-1956   Transition of Care [facility]) CM/SW Contact:    Beckie Busing, RN Phone Number: 03/13/2023, 3:54 PM    Transition of Care Department [facility]) has reviewed patient and no TOC needs have been identified at this time. We will continue to monitor patient advancement through interdisciplinary progression rounds. If new patient transition needs arise, please place a TOC consult.          Expected Discharge Plan and Services                                               Social Determinants of Health (SDOH) Interventions SDOH Screenings   Tobacco Use: Medium Risk (03/12/2023)    Readmission Risk Interventions     No data to display

## 2023-03-14 DIAGNOSIS — J441 Chronic obstructive pulmonary disease with (acute) exacerbation: Secondary | ICD-10-CM | POA: Diagnosis not present

## 2023-03-14 MED ORDER — ALBUTEROL SULFATE (2.5 MG/3ML) 0.083% IN NEBU
2.5000 mg | INHALATION_SOLUTION | Freq: Four times a day (QID) | RESPIRATORY_TRACT | Status: DC | PRN
  Administered 2023-03-15: 2.5 mg via RESPIRATORY_TRACT
  Filled 2023-03-14: qty 3

## 2023-03-14 MED ORDER — IPRATROPIUM-ALBUTEROL 0.5-2.5 (3) MG/3ML IN SOLN
3.0000 mL | Freq: Two times a day (BID) | RESPIRATORY_TRACT | Status: DC
  Administered 2023-03-14 – 2023-03-15 (×3): 3 mL via RESPIRATORY_TRACT
  Filled 2023-03-14 (×3): qty 3

## 2023-03-14 NOTE — Progress Notes (Signed)
  SATURATION QUALIFICATIONS: (This note is used to comply with regulatory documentation for home oxygen)   Patient Saturations on Room Air at Rest = 92%   Patient Saturations on Room Air while Ambulating = 87%   Patient Saturations on 2 Liters of oxygen while Ambulating = 92%   Please briefly explain why patient needs home oxygen: Pt would benefit from Oxygen because pt desats while ambulating to 87%.

## 2023-03-14 NOTE — Progress Notes (Signed)
PROGRESS NOTE    Justin [name]  EXB:[id number] DOB: Apr 05, 1956 DOA: 03/12/2023 PCP: Pcp, No   Brief Narrative:  This 67 yrs old Male with medical history significant for colon cancer s/p repair , sigmoid colectomy, depression / anxiety, tobacco use who presented to the ED for evaluation of shortness of breath.  Patient reports progressive shortness of breath for last 2 weeks which has worsened to the point when he coughs it causes rib pain he has been using his regular inhaler without any significant relief.  Patient also reports shortness of breath while lying down, even at rest.  He started back on his smoking 3 months ago, had quit smoking for many years.  He went to urgent care in age per, he was given IV Solu-Medrol and nebulization treatment.  Patient was discharged and while going home he has developed blurry vision lasting 20 seconds and now back to baseline.  Patient was also recently diagnosed with Crohn's disease and was started on steroids by his GI specialist.  Workup in the ED reveals chest x-ray negative for focal consolidation bronchitic changes noted.  Patient was given IV magnesium DuoNeb treatment and IV steroids.  Admitted for COPD exacerbation.  Assessment & Plan:   Principal Problem:   COPD with acute exacerbation (HCC) Active Problems:   Tobacco use  Acute hypoxic respiratory failure: COPD with acute exacerbation: Patient presented in the ED with worsening shortness of breath, found to have significant wheezing on exam. Suspect underlying undiagnosed COPD presented with signs and symptoms consistent with COPD exacerbation. Continue scheduled and as needed nebulized bronchodilators , Continue IV Solu-Medrol. Exacerbated by recent smoking. Chest x-ray negative for evidence of pneumonia. Continue IV Solu-Medrol 40 mg every 12 hours. Continue with scheduled and as needed nebulized bronchodilators. Continue supplemental oxygen and wean as tolerated. Started on  Brovana/Pulmicort twice daily.  Tobacco use. Patient started smoking again about 3 months ago. Smoking cessation advised.  Nicotine patch ordered.  DVT prophylaxis: Lovenox Code Status:Full code Family Communication: No family at bed side. Disposition Plan:  Status is: Inpatient Remains inpatient appropriate because: Admitted for COPD exacerbation.  Improving.    Consultants:  None  Procedures: None Antimicrobials:  Anti-infectives (From admission, onward)    None      Subjective: Patient was seen and examined at bedside.  Overnight events noted.   Patient still reports having wheezing and shortness of breath,  still requiring 2 L of supplemental oxygen.  Objective: Vitals:   03/14/23 0513 03/14/23 0757 03/14/23 0939 03/14/23 0940  BP: (!) 162/99     Pulse: 76     Resp: 18     Temp: 97.7 F (36.5 C)     TempSrc: Oral     SpO2: 92% 94% (!) 87% 93%  Weight:      Height:        Intake/Output Summary (Last 24 hours) at 03/14/2023 1107 Last data filed at 03/14/2023 0815 Gross per 24 hour  Intake 1200 ml  Output --  Net 1200 ml   Filed Weights   03/12/23 1935  Weight: 81.6 kg    Examination:  General exam: Appears comfortable, not in any acute distress, deconditioned. Respiratory system: Mild wheezing noted, respiratory effort normal, RR 16. Cardiovascular system: S1 & S2 heard, regular rate and rhythm, no murmur. Gastrointestinal system: Abdomen is soft, non tender, non distended, BS+ Central nervous system: Alert and oriented X 3. No focal neurological deficits. Extremities: No edema, no cyanosis, no clubbing Skin: No rashes, lesions  or ulcers Psychiatry: Judgement and insight appear normal. Mood & affect appropriate.   Data Reviewed: I have personally reviewed following labs and imaging studies  CBC: Recent Labs  Lab 03/12/23 2006 03/13/23 0548  WBC 6.7 4.3  NEUTROABS 6.0  --   HGB 15.8 14.4  HCT 48.4 45.0  MCV 97.4 97.6  PLT 183 162   Basic  Metabolic Panel: Recent Labs  Lab 03/12/23 2006 03/13/23 0548  NA 135 135  K 4.1 4.2  CL 103 103  CO2 22 24  GLUCOSE 120* 196*  BUN 21 23  CREATININE 1.03 0.88  CALCIUM 8.6* 8.4*   GFR: Estimated Creatinine Clearance: 85.6 mL/min (by C-G formula based on SCr of 0.88 mg/dL). Liver Function Tests: No results for input(s): "AST", "ALT", "ALKPHOS", "BILITOT", "PROT", "ALBUMIN" in the last 168 hours. No results for input(s): "LIPASE", "AMYLASE" in the last 168 hours. No results for input(s): "AMMONIA" in the last 168 hours. Coagulation Profile: No results for input(s): "INR", "PROTIME" in the last 168 hours. Cardiac Enzymes: No results for input(s): "CKTOTAL", "CKMB", "CKMBINDEX", "TROPONINI" in the last 168 hours. BNP (last 3 results) No results for input(s): "PROBNP" in the last 8760 hours. HbA1C: No results for input(s): "HGBA1C" in the last 72 hours. CBG: No results for input(s): "GLUCAP" in the last 168 hours. Lipid Profile: No results for input(s): "CHOL", "HDL", "LDLCALC", "TRIG", "CHOLHDL", "LDLDIRECT" in the last 72 hours. Thyroid Function Tests: No results for input(s): "TSH", "T4TOTAL", "FREET4", "T3FREE", "THYROIDAB" in the last 72 hours. Anemia Panel: No results for input(s): "VITAMINB12", "FOLATE", "FERRITIN", "TIBC", "IRON", "RETICCTPCT" in the last 72 hours. Sepsis Labs: No results for input(s): "PROCALCITON", "LATICACIDVEN" in the last 168 hours.  No results found for this or any previous visit (from the past 240 hour(s)).    Radiology Studies: DG Chest 2 View  Result Date: 03/12/2023 CLINICAL DATA:  Cough congestion EXAM: CHEST - 2 VIEW COMPARISON:  06/29/2012 FINDINGS: Bronchitic changes at the bases. Chronic right CP angle scarring. No consolidation or pleural effusion. Stable cardiomediastinal silhouette. No pneumothorax IMPRESSION: No active cardiopulmonary disease. Bronchitic changes at the bases. Electronically Signed   By: Jasmine Pang M.D.   On:  03/12/2023 21:58    Scheduled Meds:  arformoterol  15 mcg Nebulization BID   budesonide (PULMICORT) nebulizer solution  0.25 mg Nebulization BID   enoxaparin (LOVENOX) injection  40 mg Subcutaneous Q24H   gabapentin  300 mg Oral TID   guaiFENesin  600 mg Oral BID   ipratropium-albuterol  3 mL Nebulization BID   methylPREDNISolone (SOLU-MEDROL) injection  40 mg Intravenous BID   nicotine  21 mg Transdermal Daily   pantoprazole  40 mg Oral Daily   Continuous Infusions:   LOS: 1 day    Time spent: 35 mins    Willeen Niece, MD [facility]   If 7PM-7AM, please contact night-coverage

## 2023-03-15 DIAGNOSIS — J441 Chronic obstructive pulmonary disease with (acute) exacerbation: Secondary | ICD-10-CM | POA: Diagnosis not present

## 2023-03-15 MED ORDER — OXYCODONE-ACETAMINOPHEN 5-325 MG PO TABS: 1.0000 | ORAL_TABLET | Freq: Three times a day (TID) | ORAL | 0 refills | Status: AC | PRN

## 2023-03-15 MED ORDER — GUAIFENESIN ER 600 MG PO TB12: 600.0000 mg | ORAL_TABLET | Freq: Two times a day (BID) | ORAL | 0 refills | Status: AC

## 2023-03-15 MED ORDER — MONTELUKAST SODIUM 10 MG PO TABS: 10.0000 mg | ORAL_TABLET | Freq: Every day | ORAL | 1 refills | Status: AC

## 2023-03-15 MED ORDER — NICOTINE 21 MG/24HR TD PT24: 21.0000 mg | MEDICATED_PATCH | Freq: Every day | TRANSDERMAL | 0 refills | Status: DC

## 2023-03-15 MED ORDER — ALBUTEROL SULFATE HFA 108 (90 BASE) MCG/ACT IN AERS: 2.0000 | INHALATION_SPRAY | RESPIRATORY_TRACT | 1 refills | Status: DC | PRN

## 2023-03-15 MED ORDER — MONTELUKAST SODIUM 10 MG PO TABS: 10.0000 mg | ORAL_TABLET | Freq: Every day | ORAL | 1 refills | Status: DC

## 2023-03-15 MED ORDER — BACLOFEN 10 MG PO TABS: 10.0000 mg | ORAL_TABLET | Freq: Two times a day (BID) | ORAL | 0 refills | Status: DC | PRN

## 2023-03-15 MED ORDER — BUDESONIDE 3 MG PO CPEP: 9.0000 mg | ORAL_CAPSULE | Freq: Every day | ORAL | 1 refills | Status: AC

## 2023-03-15 MED ORDER — IPRATROPIUM-ALBUTEROL 0.5-2.5 (3) MG/3ML IN SOLN: 3.0000 mL | Freq: Two times a day (BID) | RESPIRATORY_TRACT | 1 refills | Status: DC

## 2023-03-15 MED ORDER — PREDNISONE 20 MG PO TABS: 40.0000 mg | ORAL_TABLET | Freq: Every day | ORAL | 0 refills | Status: AC

## 2023-03-15 MED ORDER — GUAIFENESIN ER 600 MG PO TB12: 600.0000 mg | ORAL_TABLET | Freq: Two times a day (BID) | ORAL | 0 refills | Status: DC

## 2023-03-15 MED ORDER — PREDNISONE 20 MG PO TABS: 40.0000 mg | ORAL_TABLET | Freq: Every day | ORAL | 0 refills | Status: DC

## 2023-03-15 MED ORDER — BUDESONIDE 3 MG PO CPEP: 9.0000 mg | ORAL_CAPSULE | Freq: Every day | ORAL | 1 refills | Status: DC

## 2023-03-15 MED ORDER — OXYCODONE-ACETAMINOPHEN 5-325 MG PO TABS: 1.0000 | ORAL_TABLET | Freq: Three times a day (TID) | ORAL | 0 refills | Status: DC | PRN

## 2023-03-15 NOTE — Discharge Instructions (Signed)
Advised to follow up PCP in one week. Advised to take prednisone 40 mg daily for three more days. Advised to use Duoneb nebulization as needed.  Patient is being discharged on home oxygen therapy.

## 2023-03-15 NOTE — TOC Transition Note (Signed)
Transition of Care [facility]) - CM/SW Discharge Note   Patient Details  Name: BAYARDO NASSIF MRN: [id number] Date of Birth: Feb 20, 1956  Transition of Care [facility]) CM/SW Contact:  Larrie Kass, LCSW Phone Number: 03/15/2023, 9:47 AM   Clinical Narrative:    CSW spoke with pt regarding recommendations for home O2 and neb machine, pt reports  no preference in DME company. CSW sent referral to [employer], O2 and Neb machine will be delivered to pt's room prior to d/c . Pt reports no need for transportation assistance. Pt reports he will call someone at time of d/c . No additional TOC needs, TOC sign off.   Final next level of care: Home/Self Care Barriers to Discharge: No Barriers Identified   Patient Goals and CMS Choice CMS Medicare.gov Compare Post Acute Care list provided to:: Patient Choice offered to / list presented to : Patient  Discharge Placement                      Patient and family notified of of transfer: 03/15/23  Discharge Plan and Services Additional resources added to the After Visit Summary for                  DME Arranged: Nebulizer/meds, Oxygen DME Agency: [employer] Date DME Agency Contacted: 03/15/23 Time DME Agency Contacted: [phone] Representative spoke with at DME Agency: jermaine            Social Determinants of Health (SDOH) Interventions SDOH Screenings   Tobacco Use: Medium Risk (03/12/2023)     Readmission Risk Interventions     No data to display

## 2023-03-15 NOTE — Discharge Summary (Addendum)
Physician Discharge Summary  ROLONDO SECREASE WJX:[id number] DOB: [birth year] DOA: 03/12/2023  PCP: Pcp, No  Admit date: 03/12/2023  Discharge date: 03/15/2023  Admitted From: Home.  Disposition:  Home.  Recommendations for Outpatient Follow-up:  Follow up with PCP in 1-2 weeks. Please obtain BMP/CBC in one week. Advised to take prednisone 40 mg daily for three more days. Advised to use Duoneb nebulization as needed.  Patient is being discharged on home oxygen therapy.  Home Health: None Equipment/Devices: Home oxygen therapy  Discharge Condition: Stable CODE STATUS:Full code Diet recommendation: Heart Healthy   Brief [facility] Course: This 67 yrs old Male with medical history significant for colon cancer s/p repair , sigmoid colectomy, depression / anxiety, tobacco use who presented to the ED for evaluation of shortness of breath.  Patient reports progressive shortness of breath for last 2 weeks which has worsened to the point when he coughs,  it causes rib pain,  he has been using his regular inhaler without any significant relief.  Patient also reports shortness of breath while lying down, even at rest.  He started back on his smoking 3 months ago, He had quit smoking for many years.  He went to urgent care in [place], he was given IV Solu-Medrol and nebulization treatment.  Patient was discharged and while going home,  he has developed blurry vision lasting 20 seconds and now back to baseline.  Patient was also recently diagnosed with Crohn's disease and was started on steroids by his GI specialist.  Workup in the ED reveals chest x-ray negative for focal consolidation,  bronchitic changes noted.  Patient was given IV magnesium,  DuoNeb treatment and IV steroids.  Admitted for COPD exacerbation.  Patient was continued on IV Solu-Medrol and IV supportive care.  He was also continued on supplemental oxygen.  Patient continued to require oxygen, he qualified for home oxygen therapy.   Patient feels better and  wants to be discharged.  Patient is being discharged home on prednisone for 3 more days.  Home oxygen therapy arranged.  Patient is being discharged home.   Discharge Diagnoses:  Principal Problem:   COPD with acute exacerbation (HCC) Active Problems:   Tobacco use  Acute hypoxic respiratory failure: COPD with acute exacerbation: Patient presented in the ED with worsening shortness of breath, found to have significant wheezing on exam. Suspect underlying undiagnosed COPD presented with signs and symptoms consistent with COPD exacerbation. Continue scheduled and as needed nebulized bronchodilators , Continue IV Solu-Medrol. Exacerbated by recent smoking. Chest x-ray negative for evidence of pneumonia. Continue IV Solu-Medrol 40 mg every 12 hours. Continue with scheduled and as needed nebulized bronchodilators. Continue supplemental oxygen and wean as tolerated. Started on Brovana/Pulmicort twice daily. Home oxygen arranged.   Tobacco use. Patient started smoking again about 3 months ago. Smoking cessation advised.  Nicotine patch ordered.  Discharge Instructions  Discharge Instructions     Call MD for:  difficulty breathing, headache or visual disturbances   Complete by: As directed    Call MD for:  persistant dizziness or light-headedness   Complete by: As directed    Call MD for:  persistant nausea and vomiting   Complete by: As directed    Diet - low sodium heart healthy   Complete by: As directed    Diet Carb Modified   Complete by: As directed    Discharge instructions   Complete by: As directed    Advised to follow up PCP in one week. Advised to take  prednisone 40 mg daily for three more days. Advised to use Duoneb nebulization as needed.  Patient is being discharged on home oxygen therapy.   For home use only DME Nebulizer machine   Complete by: As directed    Patient needs a nebulizer to treat with the following condition: COPD with  acute exacerbation (HCC)   Length of Need: Lifetime   For home use only DME oxygen   Complete by: As directed    Length of Need: Lifetime   Mode or (Route): Nasal cannula   Liters per Minute: 2   Frequency: Continuous (stationary and portable oxygen unit needed)   Oxygen conserving device: Yes   Oxygen delivery system: Gas   Increase activity slowly   Complete by: As directed       Allergies as of 03/15/2023   No Known Allergies      Medication List     STOP taking these medications    escitalopram 20 MG tablet Commonly known as: LEXAPRO   traZODone 150 MG tablet Commonly known as: DESYREL       TAKE these medications    albuterol 108 (90 Base) MCG/ACT inhaler Commonly known as: Ventolin HFA Inhale 2 puffs into the lungs every 4 (four) hours as needed for wheezing or shortness of breath.   baclofen 10 MG tablet Commonly known as: LIORESAL Take 1 tablet (10 mg total) by mouth every 12 (twelve) hours as needed for muscle spasms.   budesonide 3 MG 24 hr capsule Commonly known as: ENTOCORT EC Take 3 capsules (9 mg total) by mouth daily.   gabapentin 300 MG capsule Commonly known as: NEURONTIN TAKE 1 CAPSULE (300 MG TOTAL) BY MOUTH 2 (TWO) TIMES DAILY. What changed:  how much to take how to take this when to take this   guaiFENesin 600 MG 12 hr tablet Commonly known as: MUCINEX Take 1 tablet (600 mg total) by mouth 2 (two) times daily for 10 days.   ipratropium-albuterol 0.5-2.5 (3) MG/3ML Soln Commonly known as: DUONEB Take 3 mLs by nebulization 2 (two) times daily.   montelukast 10 MG tablet Commonly known as: SINGULAIR Take 1 tablet (10 mg total) by mouth at bedtime.   nicotine 21 mg/24hr patch Commonly known as: NICODERM CQ - dosed in mg/24 hours Place 1 patch (21 mg total) onto the skin daily. Start taking on: Mar 16, 2023   oxyCODONE-acetaminophen 5-325 MG tablet Commonly known as: PERCOCET/ROXICET Take 1 tablet by mouth every 8 (eight) hours  as needed for up to 3 days for severe pain.   predniSONE 20 MG tablet Commonly known as: DELTASONE Take 2 tablets (40 mg total) by mouth daily with breakfast for 3 days.   Vitamin D (Ergocalciferol) 1.25 MG (50000 UNIT) Caps capsule Commonly known as: DRISDOL Take 50,000 Units by mouth once a week.               Durable Medical Equipment  (From admission, onward)           Start     Ordered   03/15/23 0000  For home use only DME Nebulizer machine       Question Answer Comment  Patient needs a nebulizer to treat with the following condition COPD with acute exacerbation (HCC)   Length of Need Lifetime      03/15/23 0946   03/15/23 0000  For home use only DME oxygen       Question Answer Comment  Length of Need Lifetime   Mode  or (Route) Nasal cannula   Liters per Minute 2   Frequency Continuous (stationary and portable oxygen unit needed)   Oxygen conserving device Yes   Oxygen delivery system Gas      03/15/23 0946            Follow-up Information     [facility] at [facility] Follow up in 1 week(s).   Specialty: Family Medicine Contact information: [address] [place] [address] [phone]        [facility] Follow up.   Why: Home O2 and Neb machine company               No Known Allergies  Consultations: None   Procedures/Studies: DG Chest 2 View  Result Date: 03/12/2023 CLINICAL DATA:  Cough congestion EXAM: CHEST - 2 VIEW COMPARISON:  06/29/2012 FINDINGS: Bronchitic changes at the bases. Chronic right CP angle scarring. No consolidation or pleural effusion. Stable cardiomediastinal silhouette. No pneumothorax IMPRESSION: No active cardiopulmonary disease. Bronchitic changes at the bases. Electronically Signed   By: Jasmine Pang M.D.   On: 03/12/2023 21:58    Subjective: Patient was seen and examined at bedside.  Overnight events noted.   Patient  reports doing much better and wants to be discharged.  Home oxygen arranged.  Discharge Exam: Vitals:   03/15/23 0543 03/15/23 0739  BP: (!) 144/88   Pulse: 69   Resp: 18   Temp: 97.8 F (36.6 C)   SpO2: 98% 93%   Vitals:   03/14/23 1935 03/14/23 2140 03/15/23 0543 03/15/23 0739  BP:  (!) 159/95 (!) 144/88   Pulse:  69 69   Resp:  20 18   Temp:  98 F (36.7 C) 97.8 F (36.6 C)   TempSrc:  Oral Oral   SpO2: 98% 94% 98% 93%  Weight:      Height:        General: Pt is alert, awake, not in acute distress Cardiovascular: RRR, S1/S2 +, no rubs, no gallops Respiratory: CTA bilaterally, no wheezing, no rhonchi Abdominal: Soft, NT, ND, bowel sounds + Extremities: no edema, no cyanosis    The results of significant diagnostics from this hospitalization (including imaging, microbiology, ancillary and laboratory) are listed below for reference.     Microbiology: No results found for this or any previous visit (from the past 240 hour(s)).   Labs: BNP (last 3 results) Recent Labs    03/12/23 2006  BNP 24.7   Basic Metabolic Panel: Recent Labs  Lab 03/12/23 2006 03/13/23 0548  NA 135 135  K 4.1 4.2  CL 103 103  CO2 22 24  GLUCOSE 120* 196*  BUN 21 23  CREATININE 1.03 0.88  CALCIUM 8.6* 8.4*   Liver Function Tests: No results for input(s): "AST", "ALT", "ALKPHOS", "BILITOT", "PROT", "ALBUMIN" in the last 168 hours. No results for input(s): "LIPASE", "AMYLASE" in the last 168 hours. No results for input(s): "AMMONIA" in the last 168 hours. CBC: Recent Labs  Lab 03/12/23 2006 03/13/23 0548  WBC 6.7 4.3  NEUTROABS 6.0  --   HGB 15.8 14.4  HCT 48.4 45.0  MCV 97.4 97.6  PLT 183 162   Cardiac Enzymes: No results for input(s): "CKTOTAL", "CKMB", "CKMBINDEX", "TROPONINI" in the last 168 hours. BNP: Invalid input(s): "POCBNP" CBG: No results for input(s): "GLUCAP" in the last 168 hours. D-Dimer No results for input(s): "DDIMER" in the last 72 hours. Hgb  A1c No results for input(s): "  HGBA1C" in the last 72 hours. Lipid Profile No results for input(s): "CHOL", "HDL", "LDLCALC", "TRIG", "CHOLHDL", "LDLDIRECT" in the last 72 hours. Thyroid function studies No results for input(s): "TSH", "T4TOTAL", "T3FREE", "THYROIDAB" in the last 72 hours.  Invalid input(s): "FREET3" Anemia work up No results for input(s): "VITAMINB12", "FOLATE", "FERRITIN", "TIBC", "IRON", "RETICCTPCT" in the last 72 hours. Urinalysis    Component Value Date/Time   COLORURINE AMBER (A) 04/04/2016 2044   APPEARANCEUR CLOUDY (A) 04/04/2016 2044   LABSPEC 1.026 04/04/2016 2044   PHURINE 7.0 04/04/2016 2044   GLUCOSEU NEGATIVE 04/04/2016 2044   HGBUR NEGATIVE 04/04/2016 2044   HGBUR trace-intact 03/29/2010 1512   BILIRUBINUR SMALL (A) 04/04/2016 2044   BILIRUBINUR neg 12/07/2015 1657   KETONESUR >80 (A) 04/04/2016 2044   PROTEINUR 30 (A) 04/04/2016 2044   UROBILINOGEN negative 12/07/2015 1657   UROBILINOGEN 1 10/04/2013 0854   NITRITE NEGATIVE 04/04/2016 2044   LEUKOCYTESUR SMALL (A) 04/04/2016 2044   Sepsis Labs Recent Labs  Lab 03/12/23 2006 03/13/23 0548  WBC 6.7 4.3   Microbiology No results found for this or any previous visit (from the past 240 hour(s)).   Time coordinating discharge: Over 30 minutes  SIGNED:   Willeen Niece, MD  [facility] 03/15/2023, 2:43 PM Pager   If 7PM-7AM, please contact night-coverage

## 2023-03-15 NOTE — Plan of Care (Addendum)
Patient is alert and oriented X4, discharge paperwork printed and awaiting oxygen delivery. PIV removed, all medications explained and follow up visits, questions answered. Patient will be wheeled downstairs once oxygen tank is delivered. 1200: Oxygen & Nebulizer machine delivered, patients brother picking him up.  Problem: Education: Goal: Knowledge of disease or condition will improve Outcome: Adequate for Discharge Goal: Knowledge of the prescribed therapeutic regimen will improve Outcome: Adequate for Discharge Goal: Individualized Educational Video(s) Outcome: Adequate for Discharge   Problem: Activity: Goal: Ability to tolerate increased activity will improve Outcome: Adequate for Discharge Goal: Will verbalize the importance of balancing activity with adequate rest periods Outcome: Adequate for Discharge   Problem: Respiratory: Goal: Ability to maintain a clear airway will improve Outcome: Adequate for Discharge Goal: Levels of oxygenation will improve Outcome: Adequate for Discharge Goal: Ability to maintain adequate ventilation will improve Outcome: Adequate for Discharge   Problem: Education: Goal: Knowledge of General Education information will improve Description: Including pain rating scale, medication(s)/side effects and non-pharmacologic comfort measures Outcome: Adequate for Discharge   Problem: Health Behavior/Discharge Planning: Goal: Ability to manage health-related needs will improve Outcome: Adequate for Discharge   Problem: Clinical Measurements: Goal: Ability to maintain clinical measurements within normal limits will improve Outcome: Adequate for Discharge Goal: Will remain free from infection Outcome: Adequate for Discharge Goal: Diagnostic test results will improve Outcome: Adequate for Discharge Goal: Respiratory complications will improve Outcome: Adequate for Discharge Goal: Cardiovascular complication will be avoided Outcome: Adequate for  Discharge   Problem: Activity: Goal: Risk for activity intolerance will decrease Outcome: Adequate for Discharge   Problem: Nutrition: Goal: Adequate nutrition will be maintained Outcome: Adequate for Discharge   Problem: Coping: Goal: Level of anxiety will decrease Outcome: Adequate for Discharge   Problem: Elimination: Goal: Will not experience complications related to bowel motility Outcome: Adequate for Discharge Goal: Will not experience complications related to urinary retention Outcome: Adequate for Discharge   Problem: Pain Managment: Goal: General experience of comfort will improve Outcome: Adequate for Discharge   Problem: Safety: Goal: Ability to remain free from injury will improve Outcome: Adequate for Discharge   Problem: Skin Integrity: Goal: Risk for impaired skin integrity will decrease Outcome: Adequate for Discharge

## 2023-03-24 ENCOUNTER — Encounter (HOSPITAL_BASED_OUTPATIENT_CLINIC_OR_DEPARTMENT_OTHER): Payer: Self-pay | Admitting: Family Medicine

## 2023-03-24 ENCOUNTER — Ambulatory Visit (HOSPITAL_BASED_OUTPATIENT_CLINIC_OR_DEPARTMENT_OTHER): Payer: Medicare HMO | Admitting: Family Medicine

## 2023-03-24 VITALS — BP 115/81 | HR 72 | Temp 98.0°F | Ht 71.0 in | Wt 187.3 lb

## 2023-03-24 DIAGNOSIS — Z79899 Other long term (current) drug therapy: Secondary | ICD-10-CM | POA: Diagnosis not present

## 2023-03-24 DIAGNOSIS — M8588 Other specified disorders of bone density and structure, other site: Secondary | ICD-10-CM | POA: Diagnosis not present

## 2023-03-24 DIAGNOSIS — R053 Chronic cough: Secondary | ICD-10-CM

## 2023-03-24 DIAGNOSIS — J209 Acute bronchitis, unspecified: Secondary | ICD-10-CM

## 2023-03-24 DIAGNOSIS — M25559 Pain in unspecified hip: Secondary | ICD-10-CM | POA: Diagnosis not present

## 2023-03-24 DIAGNOSIS — G894 Chronic pain syndrome: Secondary | ICD-10-CM | POA: Diagnosis not present

## 2023-03-24 DIAGNOSIS — Z1389 Encounter for screening for other disorder: Secondary | ICD-10-CM | POA: Diagnosis not present

## 2023-03-24 DIAGNOSIS — R0781 Pleurodynia: Secondary | ICD-10-CM

## 2023-03-24 DIAGNOSIS — M25551 Pain in right hip: Secondary | ICD-10-CM | POA: Diagnosis not present

## 2023-03-24 DIAGNOSIS — M549 Dorsalgia, unspecified: Secondary | ICD-10-CM | POA: Diagnosis not present

## 2023-03-24 DIAGNOSIS — J440 Chronic obstructive pulmonary disease with acute lower respiratory infection: Secondary | ICD-10-CM | POA: Diagnosis not present

## 2023-03-24 DIAGNOSIS — M545 Low back pain, unspecified: Secondary | ICD-10-CM | POA: Diagnosis not present

## 2023-03-24 DIAGNOSIS — M4726 Other spondylosis with radiculopathy, lumbar region: Secondary | ICD-10-CM | POA: Diagnosis not present

## 2023-03-24 LAB — COMPREHENSIVE METABOLIC PANEL
ALT: 11 IU/L (ref 0–44)
AST: 15 IU/L (ref 0–40)
Albumin/Globulin Ratio: 1.2 (ref 1.2–2.2)
Albumin: 3.8 g/dL — ABNORMAL LOW (ref 3.9–4.9)
Alkaline Phosphatase: 95 IU/L (ref 44–121)
BUN/Creatinine Ratio: 15 (ref 10–24)
BUN: 15 mg/dL (ref 8–27)
Bilirubin Total: 0.7 mg/dL (ref 0.0–1.2)
CO2: 26 mmol/L (ref 20–29)
Calcium: 8.7 mg/dL (ref 8.6–10.2)
Chloride: 102 mmol/L (ref 96–106)
Creatinine, Ser: 0.98 mg/dL (ref 0.76–1.27)
Globulin, Total: 3.1 g/dL (ref 1.5–4.5)
Glucose: 90 mg/dL (ref 70–99)
Potassium: 4.7 mmol/L (ref 3.5–5.2)
Sodium: 140 mmol/L (ref 134–144)
Total Protein: 6.9 g/dL (ref 6.0–8.5)
eGFR: 84 mL/min/{1.73_m2} (ref >59)

## 2023-03-24 LAB — CBC WITH DIFFERENTIAL/PLATELET
Basophils Absolute: 0 10*3/uL (ref 0.0–0.2)
Basos: 0 %
EOS (ABSOLUTE): 0.2 10*3/uL (ref 0.0–0.4)
Eos: 2 %
Hematocrit: 47.1 % (ref 37.5–51.0)
Hemoglobin: 15.5 g/dL (ref 13.0–17.7)
Immature Grans (Abs): 0.1 10*3/uL (ref 0.0–0.1)
Immature Granulocytes: 1 %
Lymphocytes Absolute: 2.2 10*3/uL (ref 0.7–3.1)
Lymphs: 20 %
MCH: 31.3 pg (ref 26.6–33.0)
MCHC: 32.9 g/dL (ref 31.5–35.7)
MCV: 95 fL (ref 79–97)
Monocytes Absolute: 0.7 10*3/uL (ref 0.1–0.9)
Monocytes: 7 %
Neutrophils Absolute: 7.7 10*3/uL — ABNORMAL HIGH (ref 1.4–7.0)
Neutrophils: 70 %
Platelets: 233 10*3/uL (ref 150–450)
RBC: 4.96 x10E6/uL (ref 4.14–5.80)
RDW: 12.3 % (ref 11.6–15.4)
WBC: 10.9 10*3/uL — ABNORMAL HIGH (ref 3.4–10.8)

## 2023-03-24 MED ORDER — PROMETHAZINE-DM 6.25-15 MG/5ML PO SYRP
5.0000 mL | ORAL_SOLUTION | Freq: Four times a day (QID) | ORAL | 0 refills | Status: DC | PRN
Start: 2023-03-24 — End: 2023-04-08

## 2023-03-24 MED ORDER — OXYCODONE-ACETAMINOPHEN 5-325 MG PO TABS
1.0000 | ORAL_TABLET | Freq: Three times a day (TID) | ORAL | 0 refills | Status: AC | PRN
Start: 2023-03-24 — End: 2023-03-29

## 2023-03-24 NOTE — Progress Notes (Addendum)
New Patient Office Visit  Subjective    Patient ID: Justin [name], male    DOB: [birth year]  Age: 67 y.o. MRN: [id number]  CC:  Chief Complaint  Patient presents with   Establish Care   Hospitalization Follow-up   HPI Justin [name] is a 67 year-old male who presents to establish care. Has medical history significant for colon cancer s/p repair, sigmoid colectomy, depression / anxiety, and former tobacco use. He presented to the ED for evaluation of shortness of breath on 03/12/2023 and was admitted. He was subsequently discharged on 03/15/2023 on home oxygen. Patient reports he is on 2.5L normally and 3L with exertion. He is not wearing oxygen in the office.   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 Reports he experiences multiple coughing fits per day with significant pain.   Followed by [facility] for gastroenterology- he has a history of colon cancer and IBS  Taking budeonside 9mg  daily and prednisone 40mg  daily for Crohn's per patient report   Review of discharge summary- would like to repeat BMP/CBC  Outpatient Encounter Medications as of 03/24/2023  Medication Sig   albuterol (VENTOLIN HFA) 108 (90 Base) MCG/ACT inhaler Inhale 2 puffs into the lungs every 4 (four) hours as needed for wheezing or shortness of breath.   baclofen (LIORESAL) 10 MG tablet Take 1 tablet (10 mg total) by mouth every 12 (twelve) hours as needed for muscle spasms.   budesonide (ENTOCORT EC) 3 MG 24 hr capsule Take 3 capsules (9 mg total) by mouth daily.   gabapentin (NEURONTIN) 300 MG capsule TAKE 1 CAPSULE (300 MG TOTAL) BY MOUTH 2 (TWO) TIMES DAILY. (Patient taking differently: Take 300 mg by mouth 2  (two) times daily. TAKE 1 CAPSULE (300 MG TOTAL) BY MOUTH 2 (TWO) TIMES DAILY.)   guaiFENesin (MUCINEX) 600 MG 12 hr tablet Take 1 tablet (600 mg total) by mouth 2 (two) times daily for 10 days.   ipratropium-albuterol (DUONEB) 0.5-2.5 (3) MG/3ML SOLN Take 3 mLs by nebulization 2 (two) times daily.   montelukast (SINGULAIR) 10 MG tablet Take 1 tablet (10 mg total) by mouth at bedtime.   nicotine (NICODERM CQ - DOSED IN MG/24 HOURS) 21 mg/24hr patch Place 1 patch (21 mg total) onto the skin daily.   oxyCODONE-acetaminophen (PERCOCET/ROXICET) 5-325 MG tablet Take 1 tablet by mouth every 8 (eight) hours as needed for up to 5 days for severe pain.   predniSONE (DELTASONE) 20 MG tablet Take 20 mg by mouth daily with breakfast. Take 2 tablets (40mg  total) by mouth daily with breakfast for 3 days   promethazine-dextromethorphan (PROMETHAZINE-DM) 6.25-15 MG/5ML syrup Take 5 mLs by mouth 4 (four) times daily as needed for cough (Maximum dose: 30mL in 24 hours).   Vitamin D, Ergocalciferol, (DRISDOL) 1.25 MG (50000 UNIT) CAPS capsule Take 50,000 Units by mouth once a week.   No facility-administered encounter medications on file as of 03/24/2023.    Past Medical History:  Diagnosis Date   Anxiety    takes approximately 10 diazepam/month-contracts to no more than that amount   BPH (benign prostatic hypertrophy)    sees urology   Colon cancer [facility]) 2003   specialist due for repeat colonoscopy in  2012   Depression    History of kidney stones    Hyperlipidemia    Insomnia    Multiple fractures 08/2008   patient fell off a rood about 13 feet onto concrete-multiple fractures including vertrebral   Overactive bladder     Past Surgical History:  Procedure Laterality Date   COLECTOMY  02/2002   sigmoid colectomy   ELBOW SURGERY     HERNIA REPAIR     HIP FRACTURE SURGERY      Family History  Problem Relation Age of Onset   Coronary artery disease Father    Other Neg Hx        no FH od colon  cancer    Social History   Socioeconomic History   Marital status: Divorced    Spouse name: Not on file   Number of children: Not on file   Years of education: Not on file   Highest education level: Not on file  Occupational History   Not on file  Tobacco Use   Smoking status: Some Days    Types: Cigarettes   Smokeless tobacco: Never   Tobacco comments:    quit tobacco 2000 smoked for 20 years  Substance and Sexual Activity   Alcohol use: No   Drug use: No   Sexual activity: Not on file  Other Topics Concern   Not on file  Social History Narrative   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  Respiratory:  Positive for cough, sputum production and shortness of breath. Negative for hemoptysis and wheezing.   Cardiovascular:  Negative for chest pain and palpitations.  Gastrointestinal:  Negative for abdominal pain, nausea and vomiting.  Genitourinary:  Negative for frequency and urgency.  Musculoskeletal:  Negative for myalgias.  Neurological:  Negative for dizziness, weakness and headaches.  Psychiatric/Behavioral:  Negative for depression, substance abuse and suicidal ideas. The patient has insomnia. The patient is not nervous/anxious.     Objective    BP 115/81   Pulse 72   Temp 98 F (36.7 C) (Oral)   Ht 5\' 11"  (1.803 m)   Wt 187 lb 4.8 oz (85 kg)   SpO2 94%   BMI 26.12 kg/m   Physical Exam Constitutional:       Appearance: Normal appearance.  Cardiovascular:     Rate and Rhythm: Normal rate and regular rhythm.     Pulses: Normal pulses.     Heart sounds: Normal heart sounds, S1 normal and S2 normal.  Pulmonary:     Effort: Pulmonary effort is normal. No respiratory distress.     Breath sounds: Decreased air movement present. Examination of the right-lower field reveals decreased breath sounds. Examination of the left-lower field reveals decreased breath sounds. Decreased breath sounds present.  Musculoskeletal:     Right lower leg: No edema.     Left lower leg: No  edema.  Neurological:     Mental Status: He is alert.    Assessment & Plan:  1. Acute bronchitis with COPD [facility]) Patient presents today to establish care with new primary care provider and hospital follow-up. He was admitted to the hospital from 5/23-5/26 for acute COPD exacerbation. He is currently taking prednisone 40mg  daily continuous (for his Crohn's disease) and was also prescribed ipratropium-albuterol (DuoNeb) twice daily and albuterol inhaler as needed. Patient reports that his shortness of breath has drastically improved, but he is still coughing multiple times per day. Plan to follow-up in 2 weeks to ensure adequate control of COPD. May need to change inhaler regimen to long-acting muscarinic antagonist (LAMA) inhaler with PRN albuterol. Will determine at follow-up appointment in 2 weeks.   - CBC with Differential/Platelet - Comprehensive metabolic panel  2. Chronic cough Patient reports experiencing multiple coughing fits throughout the day. Patient reports that his cough is present throughout the day and he has a difficult time sleeping at night due to his coughing. Advised patient to continue to utilize Mucinex during the day to help expectorate sputum. Advised patient to use promethazine-DM for cough sparingly.  - promethazine-dextromethorphan (PROMETHAZINE-DM) 6.25-15 MG/5ML syrup; Take 5 mLs by mouth 4 (four) times daily as  needed for cough (Maximum dose: 30mL in 24 hours).  Dispense: 118 mL; Refill: 0  3. Rib pain Patient reports he is experiencing right sided rib pain from multiple coughing fits daily. Tenderness to palpation on right side of rib cage- most likely musculoskeletal. Advised patient to use Percocet sparingly. Advised him to take with Mucinex so that he can successfully cough up excess sputum. PDMP reviewed, no red flags present. Will provide patient minimal number of tablets- no refills. If he continues to experience muscle pain, will utilize muscle relaxer or over-the-counter medication.  - oxyCODONE-acetaminophen (PERCOCET/ROXICET) 5-325 MG tablet; Take 1 tablet by mouth every 8 (eight) hours as needed for up to 5 days for severe pain.  Dispense: 5 tablet; Refill: 0   Return in about 2 weeks (around 04/07/2023) for COPD follow-up.   Justin Reedy, FNP

## 2023-03-25 NOTE — Progress Notes (Signed)
Please ensure patient saw MyChart message regarding his labs and follow-up appt.

## 2023-03-31 DIAGNOSIS — N281 Cyst of kidney, acquired: Secondary | ICD-10-CM | POA: Diagnosis not present

## 2023-03-31 DIAGNOSIS — M549 Dorsalgia, unspecified: Secondary | ICD-10-CM | POA: Diagnosis not present

## 2023-03-31 DIAGNOSIS — M545 Low back pain, unspecified: Secondary | ICD-10-CM | POA: Diagnosis not present

## 2023-03-31 DIAGNOSIS — R3121 Asymptomatic microscopic hematuria: Secondary | ICD-10-CM | POA: Diagnosis not present

## 2023-03-31 DIAGNOSIS — R3129 Other microscopic hematuria: Secondary | ICD-10-CM | POA: Diagnosis not present

## 2023-03-31 DIAGNOSIS — I714 Abdominal aortic aneurysm, without rupture, unspecified: Secondary | ICD-10-CM | POA: Diagnosis not present

## 2023-03-31 DIAGNOSIS — N200 Calculus of kidney: Secondary | ICD-10-CM | POA: Diagnosis not present

## 2023-04-02 DIAGNOSIS — M545 Low back pain, unspecified: Secondary | ICD-10-CM | POA: Diagnosis not present

## 2023-04-02 DIAGNOSIS — J449 Chronic obstructive pulmonary disease, unspecified: Secondary | ICD-10-CM | POA: Diagnosis not present

## 2023-04-02 DIAGNOSIS — I714 Abdominal aortic aneurysm, without rupture, unspecified: Secondary | ICD-10-CM | POA: Diagnosis not present

## 2023-04-02 DIAGNOSIS — Z09 Encounter for follow-up examination after completed treatment for conditions other than malignant neoplasm: Secondary | ICD-10-CM | POA: Diagnosis not present

## 2023-04-07 DIAGNOSIS — D225 Melanocytic nevi of trunk: Secondary | ICD-10-CM | POA: Diagnosis not present

## 2023-04-07 DIAGNOSIS — L821 Other seborrheic keratosis: Secondary | ICD-10-CM | POA: Diagnosis not present

## 2023-04-07 DIAGNOSIS — L578 Other skin changes due to chronic exposure to nonionizing radiation: Secondary | ICD-10-CM | POA: Diagnosis not present

## 2023-04-07 DIAGNOSIS — L720 Epidermal cyst: Secondary | ICD-10-CM | POA: Diagnosis not present

## 2023-04-07 DIAGNOSIS — L814 Other melanin hyperpigmentation: Secondary | ICD-10-CM | POA: Diagnosis not present

## 2023-04-08 ENCOUNTER — Ambulatory Visit (INDEPENDENT_AMBULATORY_CARE_PROVIDER_SITE_OTHER): Payer: Medicare HMO | Admitting: Family Medicine

## 2023-04-08 ENCOUNTER — Encounter (HOSPITAL_BASED_OUTPATIENT_CLINIC_OR_DEPARTMENT_OTHER): Payer: Self-pay | Admitting: Family Medicine

## 2023-04-08 VITALS — BP 135/90 | HR 75 | Ht 71.0 in | Wt 192.9 lb

## 2023-04-08 DIAGNOSIS — J449 Chronic obstructive pulmonary disease, unspecified: Secondary | ICD-10-CM | POA: Diagnosis not present

## 2023-04-08 DIAGNOSIS — Z720 Tobacco use: Secondary | ICD-10-CM

## 2023-04-08 DIAGNOSIS — R051 Acute cough: Secondary | ICD-10-CM | POA: Diagnosis not present

## 2023-04-08 DIAGNOSIS — R030 Elevated blood-pressure reading, without diagnosis of hypertension: Secondary | ICD-10-CM

## 2023-04-08 DIAGNOSIS — J441 Chronic obstructive pulmonary disease with (acute) exacerbation: Secondary | ICD-10-CM

## 2023-04-08 DIAGNOSIS — M546 Pain in thoracic spine: Secondary | ICD-10-CM

## 2023-04-08 HISTORY — DX: Chronic obstructive pulmonary disease with (acute) exacerbation: J44.1

## 2023-04-08 HISTORY — DX: Elevated blood-pressure reading, without diagnosis of hypertension: R03.0

## 2023-04-08 LAB — CBC WITH DIFFERENTIAL/PLATELET
Basophils Absolute: 0 10*3/uL (ref 0.0–0.2)
Basos: 1 %
EOS (ABSOLUTE): 0.3 10*3/uL (ref 0.0–0.4)
Eos: 5 %
Hematocrit: 43.7 % (ref 37.5–51.0)
Hemoglobin: 14.5 g/dL (ref 13.0–17.7)
Immature Grans (Abs): 0 10*3/uL (ref 0.0–0.1)
Immature Granulocytes: 0 %
Lymphocytes Absolute: 1.7 10*3/uL (ref 0.7–3.1)
Lymphs: 23 %
MCH: 31.9 pg (ref 26.6–33.0)
MCHC: 33.2 g/dL (ref 31.5–35.7)
MCV: 96 fL (ref 79–97)
Monocytes Absolute: 0.6 10*3/uL (ref 0.1–0.9)
Monocytes: 8 %
Neutrophils Absolute: 4.7 10*3/uL (ref 1.4–7.0)
Neutrophils: 63 %
Platelets: 247 10*3/uL (ref 150–450)
RBC: 4.55 x10E6/uL (ref 4.14–5.80)
RDW: 12.9 % (ref 11.6–15.4)
WBC: 7.4 10*3/uL (ref 3.4–10.8)

## 2023-04-08 NOTE — Progress Notes (Signed)
Established Patient Office Visit  Subjective   Patient ID: Justin [name], male    DOB: 07-29-1956  Age: 67 y.o. MRN: [id number]  [name] is a 67 year-old male patient who presents today for COPD follow-up. Last visit, 03/24/2023, patient presented to establish care and was recently in the hospital from 5/23-5/26.  He reports using his albuterol PRN about once every couple of days. Reports compliance with DuoNeb twice a day. Reports he is still coughing but less short of breath from last visit. He has been weaning himself off of his supplemental oxygen at home- monitors SpO2 with finger probe. He reports he is still smoking about 10 cigarettes per day- not interested in quitting at this time.   He is currently taking budesonide & prednisone PO for his chronic Crohn's disease.    Review of Systems  Constitutional:  Negative for chills, fever, malaise/fatigue and weight loss.  Respiratory:  Positive for cough and sputum production. Negative for hemoptysis, shortness of breath and wheezing.   Cardiovascular:  Negative for chest pain, palpitations and leg swelling.  Gastrointestinal:  Negative for abdominal pain, nausea and vomiting.  Musculoskeletal:  Positive for back pain (upper left side). Negative for myalgias.  Neurological:  Negative for dizziness, weakness and headaches.  Psychiatric/Behavioral:  Negative for depression and suicidal ideas. The patient is not nervous/anxious.       Objective:     BP (!) 150/98   Pulse 75   Ht 5\' 11"  (1.803 m)   Wt 192 lb 14.4 oz (87.5 kg)   SpO2 98%   BMI 26.90 kg/m  BP Readings from Last 3 Encounters:  04/08/23 (!) 150/98  03/24/23 115/81  03/15/23 (!) 144/88     Physical Exam Constitutional:      Appearance: Normal appearance.  Cardiovascular:     Rate and Rhythm: Normal rate and regular rhythm.     Pulses: Normal pulses.     Heart sounds: Normal heart sounds.  Pulmonary:     Effort: Pulmonary effort is normal. No  accessory muscle usage or respiratory distress.     Breath sounds: Examination of the right-lower field reveals decreased breath sounds. Examination of the left-lower field reveals decreased breath sounds. Decreased breath sounds present.  Neurological:     Mental Status: He is alert.  Psychiatric:        Mood and Affect: Mood normal.        Behavior: Behavior normal.        Thought Content: Thought content normal.        Judgment: Judgment normal.      Assessment & Plan:   1. Chronic obstructive pulmonary disease, unspecified COPD type [facility]) Patient presents today for follow-up after recent COPD exacerbation. Patient reports he is feeling less short of breath with exertion, has been able to wean himself off supplemental oxygen at rest and activity, and is not coughing as often. Auscultation to lungs is clear, with diminished sounds posteriorly in lower lobes. Patient reports using DuoNeb twice daily and albuterol as needed. Patient is doing well on current medication regimen- plan to keep inhaler regimen the same at this time. Does not need refill today. Advised to return to office if breathing worsens, shortness of breath returns, or sputum production increases or any other acute symptoms present.    2. COPD, frequent exacerbations (HCC) White blood cell count elevated on 03/24/2023. Plan to reassess today to ensure slight leukocytosis has resolved.  - CBC with Differential/Platelet  3. Acute  left-sided thoracic back pain Patient presents with complaints about left upper thoracic back pain. Reports frequent coughing, which he thinks is contributing to this pain. Denies recent injury/trauma, fever/chills, weakness or numbness/tingling in upper or lower extremities, loss in bowel or bladder function, neck pain, headache. Counseled patient to utilize over-the-counter medication- including acetaminophen and ibuprofen for inflammation and pain relief, along with previously prescribed baclofen as  needed. Advised patient he can rotate heat/ice on the affected area.    4. Tobacco use Patient reports that he is currently smoking about 10 cigarettes daily and is not interested in quitting at this time. Patient verbalized an understanding about the negative impacts of smoking on his health. Counseled patient about cessation- including the use of medications, nicotine patches, and nicotine gum. He is not ready to quit at this time but is aware that we are here to provide assistance if/when he is interested in quitting.   5. Elevated blood pressure reading in office without diagnosis of hypertension Patient presents today with elevated blood pressure, recheck improved but still slightly elevated. Patient in no acute distress and is well-appearing. Denies chest pain, shortness of breath, lower extremity edema, vision changes, headaches. Cardiovascular exam with heart regular rate and rhythm. Normal heart sounds, no murmurs present. No lower extremity edema present. Lungs clear and diminished to auscultation in lower lobes bilaterally. Discussed smoking cessation. Discussed healthy diet, low sodium foods, and daily physical activity. Follow-up in 6 weeks.      Return in about 6 weeks (around 05/20/2023) for elevated BP & COPD .    Alyson Reedy, FNP

## 2023-04-13 DIAGNOSIS — H9313 Tinnitus, bilateral: Secondary | ICD-10-CM | POA: Diagnosis not present

## 2023-04-13 DIAGNOSIS — H524 Presbyopia: Secondary | ICD-10-CM | POA: Diagnosis not present

## 2023-04-13 DIAGNOSIS — H6123 Impacted cerumen, bilateral: Secondary | ICD-10-CM | POA: Diagnosis not present

## 2023-04-13 DIAGNOSIS — H903 Sensorineural hearing loss, bilateral: Secondary | ICD-10-CM | POA: Diagnosis not present

## 2023-04-13 DIAGNOSIS — H5213 Myopia, bilateral: Secondary | ICD-10-CM | POA: Diagnosis not present

## 2023-04-15 DIAGNOSIS — J441 Chronic obstructive pulmonary disease with (acute) exacerbation: Secondary | ICD-10-CM | POA: Diagnosis not present

## 2023-04-21 DIAGNOSIS — K52832 Lymphocytic colitis: Secondary | ICD-10-CM | POA: Diagnosis not present

## 2023-04-21 DIAGNOSIS — Z85038 Personal history of other malignant neoplasm of large intestine: Secondary | ICD-10-CM | POA: Diagnosis not present

## 2023-04-21 DIAGNOSIS — R140 Abdominal distension (gaseous): Secondary | ICD-10-CM | POA: Diagnosis not present

## 2023-04-27 DIAGNOSIS — M549 Dorsalgia, unspecified: Secondary | ICD-10-CM | POA: Diagnosis not present

## 2023-04-27 DIAGNOSIS — Z79899 Other long term (current) drug therapy: Secondary | ICD-10-CM | POA: Diagnosis not present

## 2023-04-27 DIAGNOSIS — Z1389 Encounter for screening for other disorder: Secondary | ICD-10-CM | POA: Diagnosis not present

## 2023-04-27 DIAGNOSIS — G894 Chronic pain syndrome: Secondary | ICD-10-CM | POA: Diagnosis not present

## 2023-04-27 DIAGNOSIS — M169 Osteoarthritis of hip, unspecified: Secondary | ICD-10-CM | POA: Diagnosis not present

## 2023-05-04 NOTE — Progress Notes (Unsigned)
[facility] [place]  ASSESSMENT / PLAN: Justin [name] is a 67 y.o. male with a infrarenal abdominal aortic aneurysm measuring 35mm.  The Joint Council of the American Association for Vascular Surgery and Society for Vascular Surgery estimates annual rupture risk based on abdominal aneurysm diameter. The patient's estimated risk is {aneurysmrupture:24841}  The patient is *** a candidate for elective repair of the aneurysm to prevent rupture.  I explained the risks / benefits / alternatives to different approaches to aortic reconstruction.   I explained the specific benefits of open repair including improved durability, limited requirement for surveillance, less need for secondary intervention. I explained the specific risks from open repair including higher physiologic stress from aortic cross clamping, higher risk of perioperative complication (including stroke, MI, pneumonia, renal insufficiency and failure, etc.), higher risk of abdominal wall complications, risk of anastomotic pseudoaneurysm.  I explained the specific benefits of endovascular repair including limited physiologic stress and less recovery time, lower risk of serious perioperative complication, zero risk of abdominal wall complication.  I explained the specific risks from endovascular repair including need for lifetime surveillance, risk of large-bore arterial access, risk of requiring secondary intervention to maintain seal or patency. I explained that not all patients are candidates for endovascular repair based on their unique anatomy.  After detailed discussion the patient and I agree that the best option for the patient is ***.   Recommend:  Abstinence from all tobacco products. Blood glucose control with goal A1c < 7%. Blood pressure control with goal blood pressure < 140/90 mmHg. Lipid reduction therapy with goal LDL-C <100 mg/dL.  Aspirin 81mg  PO QD.  *** Clopidogrel 75mg  PO QD. ***  Rivaroxaban 2.5mg  PO BID. *** Cilostozal 100mg  PO BID for intermittent claudication without evidence of heart failure. Atorvastatin 40-80mg  PO QD (or other "high intensity" statin therapy).    CHIEF COMPLAINT: ***  HISTORY OF PRESENT ILLNESS: Justin [name] is a 67 y.o. male ***  VASCULAR SURGICAL HISTORY: ***  VASCULAR RISK FACTORS: {FINDINGS; POSITIVE [phone]} history of stroke / transient ischemic attack. {FINDINGS; POSITIVE [phone]} history of coronary artery disease. *** history of PCI. *** history of CABG.  {FINDINGS; POSITIVE [phone]} history of diabetes mellitus. Last A1c ***. {FINDINGS; POSITIVE [phone]} history of smoking. *** actively smoking. {FINDINGS; POSITIVE [phone]} history of hypertension. *** drug regimen with *** control. {FINDINGS; POSITIVE [phone]} history of chronic kidney disease.  Last GFR ***. CKD {stage:30421363}. {FINDINGS; POSITIVE [phone]} history of chronic obstructive pulmonary disease, treated with ***.  FUNCTIONAL STATUS: ECOG performance status: {findings; ecog performance status:31780} Ambulatory status: {TNHAmbulation:25868}  CAREY 1 AND 3 YEAR INDEX Male (2pts) 75-79 or 80-84 (2pts) >84 (3pts) Dependence in toileting (1pt) Partial or full dependence in dressing (1pt) History of malignant neoplasm (2pts) CHF (3pts) COPD (1pts) CKD (3pts)  0-3 pts 6% 1 year mortality ; 21% 3 year mortality 4-5 pts 12% 1 year mortality ; 36% 3 year mortality >5 pts 21% 1 year mortality; 54% 3 year mortality   Past Medical History:  Diagnosis Date   Anxiety    takes approximately 10 diazepam/month-contracts to no more than that amount   BPH (benign prostatic hypertrophy)    sees urology   Colon cancer [facility]) 2003   specialist due for repeat colonoscopy in 2012   Depression    History of kidney stones    Hyperlipidemia    Insomnia    Multiple fractures 08/2008    patient fell off a rood about 13 feet onto concrete-multiple  fractures including vertrebral   Overactive bladder     Past Surgical History:  Procedure Laterality Date   COLECTOMY  02/2002   sigmoid colectomy   ELBOW SURGERY     HERNIA REPAIR     HIP FRACTURE SURGERY      Family History  Problem Relation Age of Onset   Coronary artery disease Father    Other Neg Hx        no FH od colon cancer    Social History   Socioeconomic History   Marital status: Divorced    Spouse name: Not on file   Number of children: Not on file   Years of education: Not on file   Highest education level: Not on file  Occupational History   Not on file  Tobacco Use   Smoking status: Some Days    Types: Cigarettes   Smokeless tobacco: Never   Tobacco comments:    quit tobacco 2000 smoked for 20 years  Substance and Sexual Activity   Alcohol use: No   Drug use: No   Sexual activity: Not on file  Other Topics Concern   Not on file  Social History Narrative   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   Divorced (but ex-wife is currently living with patient during the temp disability)   no children      quit tob 2000 - smoked for 20 yrs    Alcohol use-no  (former drinker)     Social Determinants of Corporate investment banker Strain: Not on file  Food Insecurity: Not on file  Transportation Needs: Not on file  Physical Activity: Not on file  Stress: Not on file  Social Connections: Not on file  Intimate Partner Violence: Not on file    No Known Allergies  Current Outpatient Medications  Medication Sig Dispense Refill   albuterol (VENTOLIN HFA) 108 (90 Base) MCG/ACT inhaler Inhale 2 puffs into the lungs every 4 (four) hours as needed for wheezing or shortness of breath. 8 g 1   baclofen (LIORESAL) 10 MG tablet Take 1 tablet (10 mg total) by mouth every 12 (twelve) hours as needed for muscle spasms. 30 each 0   budesonide (ENTOCORT EC) 3 MG 24 hr capsule  Take 3 capsules (9 mg total) by mouth daily. 90 capsule 1   gabapentin (NEURONTIN) 300 MG capsule TAKE 1 CAPSULE (300 MG TOTAL) BY MOUTH 2 (TWO) TIMES DAILY. (Patient taking differently: Take 300 mg by mouth 2 (two) times daily. TAKE 1 CAPSULE (300 MG TOTAL) BY MOUTH 2 (TWO) TIMES DAILY.) 60 capsule 2   ipratropium-albuterol (DUONEB) 0.5-2.5 (3) MG/3ML SOLN Take 3 mLs by nebulization 2 (two) times daily. 360 mL 1   montelukast (SINGULAIR) 10 MG tablet Take 1 tablet (10 mg total) by mouth at bedtime. 30 tablet 1   nicotine (NICODERM CQ - DOSED IN MG/24 HOURS) 21 mg/24hr patch Place 1 patch (21 mg total) onto the skin daily. 28 patch 0   predniSONE (DELTASONE) 20 MG tablet Take 20 mg by mouth daily with breakfast. Take 2 tablets (40mg  total) by mouth daily with breakfast for 3 days     Vitamin D, Ergocalciferol, (DRISDOL) 1.25 MG (50000 UNIT) CAPS capsule Take 50,000 Units by mouth once a week.     No current facility-administered medications for this visit.    PHYSICAL EXAM There were no vitals filed for this visit.  Constitutional: *** appearing. *** distress. Appears *** nourished.  Neurologic: CN ***. *** focal findings. *** sensory loss. Psychiatric: *** Mood and affect symmetric and appropriate. Eyes: *** No icterus. No conjunctival pallor. Ears, nose, throat: *** mucous membranes moist. Midline trachea.  Cardiac: *** rate and rhythm.  Respiratory: *** unlabored. Abdominal: *** soft, non-tender, non-distended.  Peripheral vascular: *** Extremity: *** edema. *** cyanosis. *** pallor.  Skin: *** gangrene. *** ulceration.  Lymphatic: *** Stemmer's sign. *** palpable lymphadenopathy.    PERTINENT LABORATORY AND RADIOLOGIC DATA  Most recent CBC    Latest Ref Rng & Units 04/08/2023    8:37 AM 03/24/2023   10:49 AM 03/13/2023    5:48 AM  CBC  WBC 3.4 - 10.8 x10E3/uL 7.4  10.9  4.3   Hemoglobin 13.0 - 17.7 g/dL 91.4  78.2  95.6   Hematocrit 37.5 - 51.0 % 43.7  47.1  45.0   Platelets  150 - 450 x10E3/uL 247  233  162      Most recent CMP    Latest Ref Rng & Units 03/24/2023   10:49 AM 03/13/2023    5:48 AM 03/12/2023    8:06 PM  CMP  Glucose 70 - 99 mg/dL 90  213  086   BUN 8 - 27 mg/dL 15  23  21    Creatinine 0.76 - 1.27 mg/dL 5.78  4.69  6.29   Sodium 134 - 144 mmol/L 140  135  135   Potassium 3.5 - 5.2 mmol/L 4.7  4.2  4.1   Chloride 96 - 106 mmol/L 102  103  103   CO2 20 - 29 mmol/L 26  24  22    Calcium 8.6 - 10.2 mg/dL 8.7  8.4  8.6   Total Protein 6.0 - 8.5 g/dL 6.9     Total Bilirubin 0.0 - 1.2 mg/dL 0.7     Alkaline Phos 44 - 121 IU/L 95     AST 0 - 40 IU/L 15     ALT 0 - 44 IU/L 11       Renal function CrCl cannot be calculated (Patient's most recent lab result is older than the maximum 21 days allowed.).  Hgb A1c MFr Bld (%)  Date Value  10/04/2013 5.4    LDL Cholesterol  Date Value Ref Range Status  01/07/2016 115 (H) 0 - 99 mg/dL Final     Vascular Imaging: ***  Justin Schetter N. Lenell Antu, MD FACS [facility] Phone Number: [phone] 05/04/2023 11:44 AM   Total time spent on preparing this encounter including chart review, data review, collecting history, examining the patient, coordinating care for this {tnhtimebilling:26202}  Portions of this report may have been transcribed using voice recognition software.  Every effort has been made to ensure accuracy; however, inadvertent computerized transcription errors may still be present.

## 2023-05-05 ENCOUNTER — Encounter: Payer: Self-pay | Admitting: Vascular Surgery

## 2023-05-05 ENCOUNTER — Ambulatory Visit: Payer: Medicare HMO | Admitting: Vascular Surgery

## 2023-05-05 VITALS — BP 124/75 | HR 79 | Temp 98.6°F | Resp 20 | Ht 71.0 in | Wt 194.0 lb

## 2023-05-05 DIAGNOSIS — I7143 Infrarenal abdominal aortic aneurysm, without rupture: Secondary | ICD-10-CM

## 2023-05-15 DIAGNOSIS — J441 Chronic obstructive pulmonary disease with (acute) exacerbation: Secondary | ICD-10-CM | POA: Diagnosis not present

## 2023-05-18 ENCOUNTER — Telehealth (HOSPITAL_BASED_OUTPATIENT_CLINIC_OR_DEPARTMENT_OTHER): Payer: Self-pay | Admitting: Family Medicine

## 2023-05-18 ENCOUNTER — Encounter (HOSPITAL_BASED_OUTPATIENT_CLINIC_OR_DEPARTMENT_OTHER): Payer: Self-pay | Admitting: Family Medicine

## 2023-05-18 NOTE — Telephone Encounter (Signed)
Patient unable to sleep and has tried everything and in past has taken 5mg  diazapan, Can you prescribe until he sees [place] and talk about at next appt. He rescheduled appt to 05/27/23. Please advise patient

## 2023-05-20 ENCOUNTER — Ambulatory Visit (HOSPITAL_BASED_OUTPATIENT_CLINIC_OR_DEPARTMENT_OTHER): Payer: Medicare HMO | Admitting: Family Medicine

## 2023-05-27 ENCOUNTER — Encounter (HOSPITAL_BASED_OUTPATIENT_CLINIC_OR_DEPARTMENT_OTHER): Payer: Self-pay | Admitting: Family Medicine

## 2023-05-27 ENCOUNTER — Ambulatory Visit (INDEPENDENT_AMBULATORY_CARE_PROVIDER_SITE_OTHER): Payer: Medicare HMO | Admitting: Family Medicine

## 2023-05-27 ENCOUNTER — Other Ambulatory Visit (HOSPITAL_BASED_OUTPATIENT_CLINIC_OR_DEPARTMENT_OTHER): Payer: Self-pay

## 2023-05-27 VITALS — BP 108/97 | HR 73 | Ht 71.0 in | Wt 190.0 lb

## 2023-05-27 DIAGNOSIS — R030 Elevated blood-pressure reading, without diagnosis of hypertension: Secondary | ICD-10-CM

## 2023-05-27 DIAGNOSIS — Z79899 Other long term (current) drug therapy: Secondary | ICD-10-CM | POA: Diagnosis not present

## 2023-05-27 DIAGNOSIS — G4700 Insomnia, unspecified: Secondary | ICD-10-CM

## 2023-05-27 DIAGNOSIS — J449 Chronic obstructive pulmonary disease, unspecified: Secondary | ICD-10-CM

## 2023-05-27 LAB — POCT URINE DRUG SCREEN
POC Amphetamine UR: NOT DETECTED
POC Marijuana UR: NOT DETECTED
POC Methadone UR: NOT DETECTED
POC Methamphetamine UR: NOT DETECTED
POC Opiate Ur: NOT DETECTED
URINE TEMPERATURE: 97 Degrees F (ref 90.0–100.0)

## 2023-05-27 MED ORDER — DIAZEPAM 2 MG PO TABS
2.0000 mg | ORAL_TABLET | Freq: Two times a day (BID) | ORAL | 0 refills | Status: DC | PRN
Start: 2023-05-27 — End: 2023-07-08
  Filled 2023-05-27: qty 30, 15d supply, fill #0

## 2023-05-27 MED ORDER — BUDESONIDE-FORMOTEROL FUMARATE 80-4.5 MCG/ACT IN AERO
2.0000 | INHALATION_SPRAY | Freq: Two times a day (BID) | RESPIRATORY_TRACT | 3 refills | Status: DC
Start: 2023-05-27 — End: 2024-01-26
  Filled 2023-05-27: qty 10.2, 30d supply, fill #0

## 2023-05-27 MED ORDER — DIAZEPAM 2 MG PO TABS
5.0000 mg | ORAL_TABLET | Freq: Two times a day (BID) | ORAL | 0 refills | Status: DC | PRN
  Filled 2023-05-27: qty 30, 6d supply, fill #0

## 2023-05-27 NOTE — Progress Notes (Addendum)
Established Patient Office Visit  Subjective   Patient ID: Justin [name], male    DOB: 1956-06-09  Age: 67 y.o. MRN: [id number]  Chief Complaint  Patient presents with   COPD    Breathing seems to be getting a lot better, just has trouble sleeping   Justin [name] is a 67 year-old male patient who presents today for the following chronic problems:   COPD He complains of cough. There is no chest tightness, difficulty breathing, shortness of breath, sputum production or wheezing. Primary symptoms comments: He reports he has used his albuterol very rarely, probably once in the last month. Reports he is not taking his DuoNeb solution twice daily. . This is a chronic problem. The current episode started more than 1 month ago. The problem occurs rarely. The problem has been gradually improving. The cough is non-productive. Associated symptoms include malaise/fatigue. Pertinent negatives include no chest pain, dyspnea on exertion, headaches, orthopnea or weight loss. His symptoms are aggravated by climbing stairs. His symptoms are alleviated by ipratropium and oral steroids. He reports significant improvement on treatment. Risk factors for lung disease include smoking/tobacco exposure. His past medical history is significant for COPD.  Insomnia Primary symptoms: difficulty falling asleep, somnolence, malaise/fatigue.   The onset quality is gradual. The problem occurs nightly. The problem has been gradually worsening since onset. The symptoms are aggravated by anxiety and work stress. How many beverages per day that contain caffeine: 2-3.  Types of beverages you drink: coffee. The symptoms are relieved by medication. Past treatments include medication and meditation. The treatment provided moderate (per patient report) relief. Typical bedtime:  10-11 P.M..  How long after going to bed to you fall asleep: over an hour.   Duration of naps:  Other (no daytime naps).  PMH includes: depression, family  stress or anxiety, work related stressors.  Prior diagnostic workup includes:  No prior workup.  Hypertension This is a new problem. The current episode started 1 to 4 weeks ago. The problem has been waxing and waning since onset. The problem is uncontrolled. Associated symptoms include malaise/fatigue. Pertinent negatives include no anxiety, blurred vision, chest pain, headaches, orthopnea, palpitations, peripheral edema or shortness of breath. Agents associated with hypertension include NSAIDs. Risk factors for coronary artery disease include smoking/tobacco exposure, stress and male gender. Past treatments include nothing. The current treatment provides mild improvement. Compliance problems include diet, exercise and medication cost.  There is no history of angina, kidney disease, CAD/MI, CVA, heart failure, left ventricular hypertrophy, PVD or retinopathy.   Review of Systems  Constitutional:  Positive for malaise/fatigue. Negative for weight loss.  Eyes:  Negative for blurred vision and double vision.  Respiratory:  Positive for cough. Negative for sputum production, shortness of breath and wheezing.   Cardiovascular:  Negative for chest pain, dyspnea on exertion, palpitations and orthopnea.  Gastrointestinal:  Negative for abdominal pain, nausea and vomiting.  Neurological:  Negative for dizziness, speech change, focal weakness and headaches.  Psychiatric/Behavioral:  Positive for depression. Negative for hallucinations, substance abuse and suicidal ideas. The patient is nervous/anxious and has insomnia.       Objective:    BP (!) 108/97   Pulse 73   Ht 5\' 11"  (1.803 m)   Wt 190 lb (86.2 kg)   SpO2 96%   BMI 26.50 kg/m  BP Readings from Last 3 Encounters:  05/27/23 (!) 108/97  05/05/23 124/75  04/08/23 (!) 135/90     Physical Exam Constitutional:  Appearance: Normal appearance.  Cardiovascular:     Rate and Rhythm: Normal rate and regular rhythm.     Pulses: Normal  pulses.     Heart sounds: Normal heart sounds.  Pulmonary:     Effort: Pulmonary effort is normal.     Breath sounds: Normal breath sounds.  Neurological:     Mental Status: He is alert.  Psychiatric:        Mood and Affect: Mood normal.        Behavior: Behavior normal.        Thought Content: Thought content normal.        Judgment: Judgment normal.     Assessment & Plan:    1. Chronic obstructive pulmonary disease, unspecified COPD type [facility]) Patient presents today for follow-up regarding his COPD. He was prescribed DuoNeb nebulizer twice daily and albuterol PRN. He reports he has not been using his nebulizer machine and has rarely needed his albuterol for acute exacerbations. Justin change DuoNeb to Symbicort for COPD maintenance. Advised patient to use Albuterol PRN. Patient verbalized an understanding of new inhaler.   - budesonide-formoterol (SYMBICORT) 80-4.5 MCG/ACT inhaler; Inhale 2 puffs into the lungs 2 (two) times daily.  Dispense: 10.2 g; Refill: 3  2. Elevated blood pressure reading in office without diagnosis of hypertension Patient presents today with labile blood pressure readings- initial elevated blood pressure, recheck systolic reading improved/diastolic still elevated. Patient in no acute distress and is well-appearing. Denies chest pain, shortness of breath, lower extremity edema, vision changes, headaches. Cardiovascular exam with heart regular rate and rhythm. Normal heart sounds, no murmurs present. No lower extremity edema present. Lungs clear to auscultation bilaterally. Patient is not currently taking any medications. Discussed importance of low sodium diet and daily aerobic activity for 30 minutes for 3-5 days per week. Follow-up in 4-6 weeks.     3. INSOMNIA, CHRONIC Patient reports he has been dealing with insomnia chronically, but more noticeable over the past month. He reports he has tried other medications in the past with minimal success in adequate sleep.  Discussed sleep hygiene routine and using PRN medication sparingly. Discussed risks associated with benzodiazepines and advised patient that we Justin use short-term. Patient agreeable to UDS and signed controlled substance agreement. PDMP reviewed, no red flags. POCT UDS negative. Justin monitor closely.  - diazepam (VALIUM) 2 MG tablet; Take 1 tablet (2 mg total) by mouth every 12 (twelve) hours as needed for anxiety.  Dispense: 30 tablet; Refill: 0 - POCT Urine Drug Screen  4. Controlled substance agreement signed See #3 - diazepam (VALIUM) 2 MG tablet; Take 1 tablet (2 mg total) by mouth every 12 (twelve) hours as needed for anxiety.  Dispense: 30 tablet; Refill: 0 - POCT Urine Drug Screen  Return in about 6 weeks (around 07/08/2023) for HTN follow-up.    Alyson Reedy, FNP

## 2023-05-28 DIAGNOSIS — H68003 Unspecified Eustachian salpingitis, bilateral: Secondary | ICD-10-CM | POA: Diagnosis not present

## 2023-05-28 DIAGNOSIS — G894 Chronic pain syndrome: Secondary | ICD-10-CM | POA: Diagnosis not present

## 2023-05-28 DIAGNOSIS — Z85038 Personal history of other malignant neoplasm of large intestine: Secondary | ICD-10-CM | POA: Diagnosis not present

## 2023-05-28 DIAGNOSIS — J309 Allergic rhinitis, unspecified: Secondary | ICD-10-CM | POA: Diagnosis not present

## 2023-05-28 DIAGNOSIS — N400 Enlarged prostate without lower urinary tract symptoms: Secondary | ICD-10-CM | POA: Diagnosis not present

## 2023-05-28 DIAGNOSIS — L989 Disorder of the skin and subcutaneous tissue, unspecified: Secondary | ICD-10-CM | POA: Diagnosis not present

## 2023-05-28 DIAGNOSIS — I10 Essential (primary) hypertension: Secondary | ICD-10-CM | POA: Diagnosis not present

## 2023-05-28 DIAGNOSIS — K529 Noninfective gastroenteritis and colitis, unspecified: Secondary | ICD-10-CM | POA: Diagnosis not present

## 2023-05-28 DIAGNOSIS — G4700 Insomnia, unspecified: Secondary | ICD-10-CM | POA: Diagnosis not present

## 2023-05-28 DIAGNOSIS — E785 Hyperlipidemia, unspecified: Secondary | ICD-10-CM | POA: Diagnosis not present

## 2023-06-09 DIAGNOSIS — J309 Allergic rhinitis, unspecified: Secondary | ICD-10-CM | POA: Diagnosis not present

## 2023-06-09 DIAGNOSIS — Z6827 Body mass index (BMI) 27.0-27.9, adult: Secondary | ICD-10-CM | POA: Diagnosis not present

## 2023-06-09 DIAGNOSIS — M25472 Effusion, left ankle: Secondary | ICD-10-CM | POA: Diagnosis not present

## 2023-06-09 DIAGNOSIS — M7989 Other specified soft tissue disorders: Secondary | ICD-10-CM | POA: Diagnosis not present

## 2023-06-09 DIAGNOSIS — M19071 Primary osteoarthritis, right ankle and foot: Secondary | ICD-10-CM | POA: Diagnosis not present

## 2023-06-09 DIAGNOSIS — M25572 Pain in left ankle and joints of left foot: Secondary | ICD-10-CM | POA: Diagnosis not present

## 2023-06-09 DIAGNOSIS — M7732 Calcaneal spur, left foot: Secondary | ICD-10-CM | POA: Diagnosis not present

## 2023-06-09 DIAGNOSIS — M25571 Pain in right ankle and joints of right foot: Secondary | ICD-10-CM | POA: Diagnosis not present

## 2023-06-09 DIAGNOSIS — M7731 Calcaneal spur, right foot: Secondary | ICD-10-CM | POA: Diagnosis not present

## 2023-06-15 DIAGNOSIS — J441 Chronic obstructive pulmonary disease with (acute) exacerbation: Secondary | ICD-10-CM | POA: Diagnosis not present

## 2023-06-24 DIAGNOSIS — Z1389 Encounter for screening for other disorder: Secondary | ICD-10-CM | POA: Diagnosis not present

## 2023-06-24 DIAGNOSIS — M549 Dorsalgia, unspecified: Secondary | ICD-10-CM | POA: Diagnosis not present

## 2023-06-24 DIAGNOSIS — M169 Osteoarthritis of hip, unspecified: Secondary | ICD-10-CM | POA: Diagnosis not present

## 2023-06-24 DIAGNOSIS — Z79899 Other long term (current) drug therapy: Secondary | ICD-10-CM | POA: Diagnosis not present

## 2023-06-24 DIAGNOSIS — S32000A Wedge compression fracture of unspecified lumbar vertebra, initial encounter for closed fracture: Secondary | ICD-10-CM | POA: Diagnosis not present

## 2023-06-24 DIAGNOSIS — G894 Chronic pain syndrome: Secondary | ICD-10-CM | POA: Diagnosis not present

## 2023-07-06 ENCOUNTER — Ambulatory Visit: Payer: Medicare HMO

## 2023-07-06 ENCOUNTER — Ambulatory Visit: Payer: Medicare HMO | Admitting: Podiatry

## 2023-07-06 DIAGNOSIS — M722 Plantar fascial fibromatosis: Secondary | ICD-10-CM

## 2023-07-06 DIAGNOSIS — M25472 Effusion, left ankle: Secondary | ICD-10-CM

## 2023-07-06 NOTE — Patient Instructions (Signed)

## 2023-07-06 NOTE — Addendum Note (Signed)
Addended by: Mindi Curling on: 07/06/2023 09:14 AM   Modules accepted: Orders

## 2023-07-06 NOTE — Progress Notes (Signed)
Subjective:  Patient ID: Justin [name], male    DOB: 1955/10/29,  MRN: [id number]  Chief Complaint  Patient presents with   Foot Pain    C/o pain to the left heel. He has a metal plate and screws to the heel.    Joint Swelling    67 y.o. male presents with the above complaint.  Patient presents with pain in his plantar aspect of his left heel.  Prior patient previously had surgery for heel fracture many years ago and has retained hardware on the outside of his heel.  Says his pain is primarily on the bottom of his heel when he walks.  Has been diagnosed with a heel spur in the past.   Review of Systems: Negative except as noted in the HPI. Denies N/V/F/Ch.   Objective:  There were no vitals filed for this visit. There is no height or weight on file to calculate BMI. Constitutional Well developed. Well nourished.  Vascular Dorsalis pedis pulses palpable bilaterally. Posterior tibial pulses palpable bilaterally. Capillary refill normal to all digits.  No cyanosis or clubbing noted. Pedal hair growth normal.  Neurologic Normal speech. Oriented to person, place, and time. Epicritic sensation to light touch grossly present bilaterally.  Dermatologic Nails well groomed and normal in appearance. No open wounds. No skin lesions.  Orthopedic: Normal joint ROM without pain or crepitus bilaterally. No visible deformities. Tender to palpation at the calcaneal tuber left. No pain with calcaneal squeeze left. Ankle ROM diminished range with pain left. Silfverskiold Test: positive left.   Radiographs: Taken at [facility], unable to view. Prior surgical changes to calcaneus with hardware intact per report, also with heel spur  Assessment:   1. Ankle swelling, left   2. Plantar fasciitis of left foot    Plan:  Patient was evaluated and treated and all questions answered.  Plantar Fasciitis, left - XR not able to be reviewed due to technical issues - Educated on icing  and stretching. Instructions given.  - Injection delivered to the plantar fascia as below. - DME: Deferred - Pharmacologic management: Ibuprofen PRN  Procedure: Injection Tendon/Ligament Location: Left plantar fascia at the glabrous junction; medial approach. Skin Prep: alcohol Injectate: 1 cc 0.5% marcaine plain, 1 cc kenalog 10. Disposition: Patient tolerated procedure well. Injection site dressed with a band-aid.  Return if symptoms worsen or fail to improve.

## 2023-07-08 ENCOUNTER — Ambulatory Visit (INDEPENDENT_AMBULATORY_CARE_PROVIDER_SITE_OTHER): Payer: Medicare HMO | Admitting: Family Medicine

## 2023-07-08 ENCOUNTER — Encounter (HOSPITAL_BASED_OUTPATIENT_CLINIC_OR_DEPARTMENT_OTHER): Payer: Self-pay | Admitting: Family Medicine

## 2023-07-08 VITALS — BP 142/80 | HR 71 | Ht 71.0 in | Wt 187.2 lb

## 2023-07-08 DIAGNOSIS — F1190 Opioid use, unspecified, uncomplicated: Secondary | ICD-10-CM

## 2023-07-08 DIAGNOSIS — R030 Elevated blood-pressure reading, without diagnosis of hypertension: Secondary | ICD-10-CM

## 2023-07-08 HISTORY — DX: Opioid use, unspecified, uncomplicated: F11.90

## 2023-07-08 NOTE — Assessment & Plan Note (Signed)
Patient reports that he sees Dr. Fredda Hammed at [facility] for chronic pain management. He did not disclose he was actively managed by pain clinic at any previous visits and did not disclose he was taking any controlled substances. POCT UDS negative on 05/27/2023. Discussed risks of respiratory depression/arrest, cardiac arrest, and even death taking valium with Norco. Advised him to not take these medications at the same time. PDMP reviewed and see that Norco has been prescribed recently. Requested records from current pain clinic. Counseled patient that we will not be giving sleeping medication as long as he is on chronic opioids.

## 2023-07-08 NOTE — Assessment & Plan Note (Signed)
Patient presents today with slightly elevated blood pressure; however, based on patient's age, recheck is within acceptable range. Patient in no acute distress and is well-appearing. Denies chest pain, shortness of breath, lower extremity edema, vision changes, headaches. Cardiovascular exam with heart regular rate and rhythm. Normal heart sounds, no murmurs present. No lower extremity edema present. Lungs clear to auscultation bilaterally. Patient is not currently taking anti-hypertensive medications. Advised patient to closely monitor blood pressure at home and return to office if blood pressure begins to increase greater than 140/90.

## 2023-07-08 NOTE — Progress Notes (Signed)
Established Patient Office Visit  Subjective   Patient ID: KOKI PILLADO, male    DOB: [birth year]  Age: 67 y.o. MRN: [id number]  HYPERTENSION: WILMOTH MANANSALA is a 67 year-old male patient who presents for the medical management of hypertension.  Patient's current hypertension medication regimen is: lasix 40mg  PRN-- does not take it every day. Only takes it when he notices swelling.  Patient is regularly keeping a check on BP at home. "About the same she had today" Adhering to low sodium diet: no Exercising Regularly: physically active for work  Denies headache, dizziness, CP, SHOB, vision changes.    BP Readings from Last 3 Encounters:  07/08/23 (!) 142/80  05/27/23 (!) 108/97  05/05/23 124/75   Review of Systems  Constitutional:  Negative for malaise/fatigue and weight loss.  Eyes:  Negative for blurred vision and double vision.  Respiratory:  Negative for cough and shortness of breath.   Cardiovascular:  Negative for chest pain, palpitations and leg swelling.  Gastrointestinal:  Negative for abdominal pain, nausea and vomiting.  Genitourinary:  Negative for frequency and urgency.  Musculoskeletal:  Positive for back pain (chronic). Negative for myalgias.  Neurological:  Negative for dizziness, weakness and headaches.  Psychiatric/Behavioral:  Negative for depression and suicidal ideas. The patient has insomnia. The patient is not nervous/anxious.     Objective:     BP (!) 142/80   Pulse 71   Ht 5\' 11"  (1.803 m)   Wt 187 lb 3.2 oz (84.9 kg)   SpO2 97%   BMI 26.11 kg/m  BP Readings from Last 3 Encounters:  07/08/23 (!) 142/80  05/27/23 (!) 108/97  05/05/23 124/75    Physical Exam Constitutional:      Appearance: Normal appearance.  Cardiovascular:     Rate and Rhythm: Normal rate and regular rhythm.     Pulses: Normal pulses.     Heart sounds: Normal heart sounds.  Pulmonary:     Effort: Pulmonary effort is normal.     Breath sounds: Normal breath  sounds.  Neurological:     Mental Status: He is alert.  Psychiatric:        Mood and Affect: Mood normal.        Behavior: Behavior normal.        Thought Content: Thought content normal.        Judgment: Judgment normal.     Assessment & Plan:  Elevated blood pressure reading in office without diagnosis of hypertension Assessment & Plan: Patient presents today with slightly elevated blood pressure; however, based on patient's age, recheck is within acceptable range. Patient in no acute distress and is well-appearing. Denies chest pain, shortness of breath, lower extremity edema, vision changes, headaches. Cardiovascular exam with heart regular rate and rhythm. Normal heart sounds, no murmurs present. No lower extremity edema present. Lungs clear to auscultation bilaterally. Patient is not currently taking anti-hypertensive medications. Advised patient to closely monitor blood pressure at home and return to office if blood pressure begins to increase greater than 140/90.     Chronic, continuous use of opioids Assessment & Plan: Patient reports that he sees Dr. Fredda Hammed at [facility] for chronic pain management. He did not disclose he was actively managed by pain clinic at any previous visits and did not disclose he was taking any controlled substances. POCT UDS negative on 05/27/2023. Discussed risks of respiratory depression/arrest, cardiac arrest, and even death taking valium with Norco. Advised him to not take these medications at  the same time. PDMP reviewed and see that Norco has been prescribed recently. Requested records from current pain clinic. Counseled patient that we will not be giving sleeping medication as long as he is on chronic opioids.       Return in about 3 months (around 10/07/2023) for HTN follow-up.    Alyson Reedy, FNP

## 2023-07-16 DIAGNOSIS — J441 Chronic obstructive pulmonary disease with (acute) exacerbation: Secondary | ICD-10-CM | POA: Diagnosis not present

## 2023-07-21 DIAGNOSIS — S32000A Wedge compression fracture of unspecified lumbar vertebra, initial encounter for closed fracture: Secondary | ICD-10-CM | POA: Diagnosis not present

## 2023-07-21 DIAGNOSIS — X58XXXA Exposure to other specified factors, initial encounter: Secondary | ICD-10-CM | POA: Diagnosis not present

## 2023-07-21 DIAGNOSIS — I700 Atherosclerosis of aorta: Secondary | ICD-10-CM | POA: Diagnosis not present

## 2023-07-21 DIAGNOSIS — M4856XA Collapsed vertebra, not elsewhere classified, lumbar region, initial encounter for fracture: Secondary | ICD-10-CM | POA: Diagnosis not present

## 2023-07-21 DIAGNOSIS — M47816 Spondylosis without myelopathy or radiculopathy, lumbar region: Secondary | ICD-10-CM | POA: Diagnosis not present

## 2023-07-21 DIAGNOSIS — M549 Dorsalgia, unspecified: Secondary | ICD-10-CM | POA: Diagnosis not present

## 2023-08-03 ENCOUNTER — Encounter (HOSPITAL_BASED_OUTPATIENT_CLINIC_OR_DEPARTMENT_OTHER): Payer: Self-pay | Admitting: Family Medicine

## 2023-08-13 DIAGNOSIS — G894 Chronic pain syndrome: Secondary | ICD-10-CM | POA: Diagnosis not present

## 2023-08-13 DIAGNOSIS — Z79899 Other long term (current) drug therapy: Secondary | ICD-10-CM | POA: Diagnosis not present

## 2023-08-13 DIAGNOSIS — S32000A Wedge compression fracture of unspecified lumbar vertebra, initial encounter for closed fracture: Secondary | ICD-10-CM | POA: Diagnosis not present

## 2023-08-13 DIAGNOSIS — Z1389 Encounter for screening for other disorder: Secondary | ICD-10-CM | POA: Diagnosis not present

## 2023-08-13 DIAGNOSIS — M169 Osteoarthritis of hip, unspecified: Secondary | ICD-10-CM | POA: Diagnosis not present

## 2023-08-13 DIAGNOSIS — M549 Dorsalgia, unspecified: Secondary | ICD-10-CM | POA: Diagnosis not present

## 2023-08-15 DIAGNOSIS — J441 Chronic obstructive pulmonary disease with (acute) exacerbation: Secondary | ICD-10-CM | POA: Diagnosis not present

## 2023-08-18 ENCOUNTER — Ambulatory Visit (HOSPITAL_BASED_OUTPATIENT_CLINIC_OR_DEPARTMENT_OTHER): Payer: Medicare HMO

## 2023-08-18 ENCOUNTER — Encounter (HOSPITAL_BASED_OUTPATIENT_CLINIC_OR_DEPARTMENT_OTHER): Payer: Self-pay | Admitting: *Deleted

## 2023-08-18 ENCOUNTER — Encounter (HOSPITAL_BASED_OUTPATIENT_CLINIC_OR_DEPARTMENT_OTHER): Payer: Self-pay | Admitting: Student

## 2023-08-18 ENCOUNTER — Ambulatory Visit (HOSPITAL_BASED_OUTPATIENT_CLINIC_OR_DEPARTMENT_OTHER): Payer: Medicare HMO | Admitting: Student

## 2023-08-18 VITALS — BP 137/89 | HR 72 | Temp 97.7°F | Ht 71.0 in | Wt 186.4 lb

## 2023-08-18 DIAGNOSIS — Z23 Encounter for immunization: Secondary | ICD-10-CM

## 2023-08-18 DIAGNOSIS — G8929 Other chronic pain: Secondary | ICD-10-CM

## 2023-08-18 DIAGNOSIS — M25572 Pain in left ankle and joints of left foot: Secondary | ICD-10-CM | POA: Diagnosis not present

## 2023-08-18 DIAGNOSIS — S92002D Unspecified fracture of left calcaneus, subsequent encounter for fracture with routine healing: Secondary | ICD-10-CM | POA: Diagnosis not present

## 2023-08-18 DIAGNOSIS — Z0000 Encounter for general adult medical examination without abnormal findings: Secondary | ICD-10-CM

## 2023-08-18 DIAGNOSIS — M25562 Pain in left knee: Secondary | ICD-10-CM | POA: Diagnosis not present

## 2023-08-18 MED ORDER — LIDOCAINE HCL 1 % IJ SOLN
4.0000 mL | INTRAMUSCULAR | Status: AC | PRN
Start: 2023-08-18 — End: 2023-08-18
  Administered 2023-08-18: 4 mL

## 2023-08-18 MED ORDER — TRIAMCINOLONE ACETONIDE 40 MG/ML IJ SUSP
2.0000 mL | INTRAMUSCULAR | Status: AC | PRN
  Administered 2023-08-18: 2 mL via INTRA_ARTICULAR

## 2023-08-18 MED ORDER — MELOXICAM 15 MG PO TABS: 15.0000 mg | ORAL_TABLET | Freq: Every day | ORAL | 0 refills | Status: AC

## 2023-08-18 MED ORDER — FLUTICASONE PROPIONATE 50 MCG/ACT NA SUSP: 2.0000 | Freq: Every day | NASAL | 3 refills | Status: DC

## 2023-08-18 NOTE — Addendum Note (Signed)
Addended by: Wyvonne Lenz on: 08/18/2023 09:07 AM   Modules accepted: Orders

## 2023-08-18 NOTE — Progress Notes (Signed)
Chief Complaint: Left knee and ankle pain     History of Present Illness:    Justin [name] is a 67 y.o. male presenting today with pain in his left knee and ankle.  Knee pain began about 6 months ago with no known injury.  This is bothering him the most today.  Pain is located in the medial knee and is moderate in severity on average.  Denies any previous knee pain or injuries.  Has tried Ztildo patches with no relief.  Also reports pain and swelling in his ankle today that is been chronic for many years.  He has injured this ankle many times in the past while participating in motocross.  He is followed by pain management and is prescribed hydrocodone.  Denies any numbness or tingling.  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   Surgical History:   Heel fracture surgery - 11 years ago  PMH/PSH/Family History/Social History/Meds/Allergies:    Past Medical History:  Diagnosis Date   Anxiety    takes approximately 10 diazepam/month-contracts to no more than that amount   BPH (benign prostatic hypertrophy)    sees urology   Colon cancer [facility]) 2003   specialist due for repeat colonoscopy in 2012   Depression    History of kidney stones    Hyperlipidemia    Insomnia    Multiple fractures 08/2008   patient fell off a rood about 13 feet onto concrete-multiple fractures including vertrebral   Overactive bladder    Past Surgical History:  Procedure Laterality Date   COLECTOMY  02/2002   sigmoid colectomy   ELBOW SURGERY     HERNIA REPAIR     HIP FRACTURE SURGERY     Social History   Socioeconomic History   Marital status: Divorced    Spouse name: Not on file   Number of children: Not on file   Years of education: Not on file   Highest education level: Not on file  Occupational History   Not on file  Tobacco Use   Smoking status: Some Days    Current packs/day: 0.25    Types: Cigarettes    Smokeless tobacco: Never   Tobacco comments:    quit tobacco 2000 smoked for 20 years  Substance and Sexual Activity   Alcohol use: No   Drug use: No   Sexual activity: Not on file  Other Topics Concern   Not on file  Social History Narrative   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   Divorced (but ex-wife is currently living with patient during the temp disability)   no children      quit tob 2000 - smoked for 20 yrs    Alcohol use-no  (former drinker)     Social Determinants of Health   Financial Resource Strain: Low Risk  (08/18/2023)   Overall Financial Resource Strain (CARDIA)    Difficulty of Paying Living Expenses: Not hard at all  Food Insecurity: No Food Insecurity (08/18/2023)   Hunger Vital Sign    Worried About Running Out of Food in the Last Year: Never true    Ran Out of Food in the Last Year: Never true  Transportation Needs: No Transportation Needs (08/18/2023)   PRAPARE - Transportation  Lack of Transportation (Medical): No    Lack of Transportation (Non-Medical): No  Physical Activity: Sufficiently Active (08/18/2023)   Exercise Vital Sign    Days of Exercise per Week: 7 days    Minutes of Exercise per Session: 40 min  Stress: Stress Concern Present (08/18/2023)   [employer] of Occupational Health - Occupational Stress Questionnaire    Feeling of Stress : Very much  Social Connections: Socially Isolated (08/18/2023)   Social Connection and Isolation Panel [NHANES]    Frequency of Communication with Friends and Family: More than three times a week    Frequency of Social Gatherings with Friends and Family: Twice a week    Attends Religious Services: Never    Database administrator or Organizations: No    Attends Engineer, structural: Never    Marital Status: Divorced   Family History  Problem Relation Age of Onset   Coronary artery disease Father    Other Neg Hx        no FH od colon cancer   No Known  Allergies Current Outpatient Medications  Medication Sig Dispense Refill   albuterol (VENTOLIN HFA) 108 (90 Base) MCG/ACT inhaler Inhale 2 puffs into the lungs every 4 (four) hours as needed for wheezing or shortness of breath. 8 g 1   budesonide-formoterol (SYMBICORT) 80-4.5 MCG/ACT inhaler Inhale 2 puffs into the lungs 2 (two) times daily. 10.2 g 3   fluticasone (FLONASE) 50 MCG/ACT nasal spray Place 2 sprays into both nostrils daily. 16 g 3   furosemide (LASIX) 40 MG tablet PLEASE SEE ATTACHED FOR DETAILED DIRECTIONS     gabapentin (NEURONTIN) 300 MG capsule TAKE 1 CAPSULE (300 MG TOTAL) BY MOUTH 2 (TWO) TIMES DAILY. (Patient taking differently: Take 300 mg by mouth 2 (two) times daily. TAKE 1 CAPSULE (300 MG TOTAL) BY MOUTH 2 (TWO) TIMES DAILY.) 60 capsule 2   loratadine (CLARITIN) 10 MG tablet Take 10 mg by mouth daily.     montelukast (SINGULAIR) 10 MG tablet Take 1 tablet (10 mg total) by mouth at bedtime. 30 tablet 1   nicotine (NICODERM CQ - DOSED IN MG/24 HOURS) 21 mg/24hr patch Place 1 patch (21 mg total) onto the skin daily. 28 patch 0   potassium chloride (MICRO-K) 10 MEQ CR capsule SMARTSIG:1 Capsule(s) By Mouth     predniSONE (DELTASONE) 20 MG tablet Take 20 mg by mouth daily with breakfast. Take 2 tablets (40mg  total) by mouth daily with breakfast for 3 days     tamsulosin (FLOMAX) 0.4 MG CAPS capsule Take 0.4 mg by mouth daily.     tiZANidine (ZANAFLEX) 4 MG tablet Take 4 mg by mouth at bedtime.     Vitamin D, Ergocalciferol, (DRISDOL) 1.25 MG (50000 UNIT) CAPS capsule Take 50,000 Units by mouth once a week.     ZTLIDO 1.8 % PTCH      No current facility-administered medications for this visit.   No results found.  Review of Systems:   A ROS was performed including pertinent positives and negatives as documented in the HPI.  Physical Exam :   Constitutional: NAD and appears stated age Neurological: Alert and oriented Psych: Appropriate affect and cooperative There were  no vitals taken for this visit.   Comprehensive Musculoskeletal Exam:    Active range of motion of the left knee from 0 to 130 degrees.  Positive medial joint line tenderness.  No laxity with varus or valgus stress.  Mild effusion present.  No tenderness over  the medial or lateral malleolus with mild swelling noted in the left ankle.  Active dorsiflexion to 10 degrees and plantarflexion to 30 degrees.  Negative anterior drawer.  Neurosensory exam intact to the left lower extremity.  Imaging:   Xray (left ankle 3 views): Mild degenerative changes of the ankle but no acute abnormality present.  Well appearing calcaneal fixation hardware without evidence loosening.  Xray (left knee 4 views): Mild medial compartment narrowing and small patellofemoral osteophytes indicating mild osteoarthritis.  I personally reviewed and interpreted the radiographs.   Assessment:   67 y.o. male with chronic left knee and ankle pain.  Denies any previous treatment to the knee.  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  For this reason I have recommended use of a cortisone injection for symptom relief.  Patient is agreeable and this was performed today without complication.  He does have chronic left ankle issues stemming from multiple injuries as well as previous surgery of the calcaneus.  I will plan to send him a week of meloxicam to help with pain and to reduce inflammation.  Will plan to see him back as needed.  Plan :    -Cortisone injection performed of the left knee today -Start meloxicam 15 mg for 1 week -Return to clinic as needed    Procedure Note  Patient: ISAI PETRICCA             Date of Birth: Jun 11, 1956           MRN: [id number]             Visit Date: 08/18/2023  Procedures: Visit Diagnoses:  1. Chronic pain of left knee   2. Pain in left ankle and joints of left foot     Large Joint Inj: L knee on 08/18/2023 12:07 PM Indications: pain Details: 22 G 1.5 in needle,  anterolateral approach Medications: 4 mL lidocaine 1 %; 2 mL triamcinolone acetonide 40 MG/ML Outcome: tolerated well, no immediate complications Procedure, treatment alternatives, risks and benefits explained, specific risks discussed. Consent was given by the patient. Immediately prior to procedure a time out was called to verify the correct patient, procedure, equipment, support staff and site/side marked as required. Patient was prepped and draped in the usual sterile fashion.      I personally saw and evaluated the patient, and participated in the management and treatment plan.  Hazle Nordmann, PA-C Orthopedics

## 2023-08-18 NOTE — Progress Notes (Signed)
Subjective:   Justin [name] is a 67 y.o. male who presents for Medicare Annual/Subsequent preventive examination.  Visit Complete: In person  Patient Medicare AWV questionnaire was completed by the patient on 08/18/2023; I have confirmed that all information answered by patient is correct and no changes since this date.  Cardiac Risk Factors include: advanced age (>67men, >28 women);family history of premature cardiovascular disease;smoking/ tobacco exposure;male gender;Other (see comment), Risk factor comments: borderline hypertension     Objective:    Today's Vitals   08/18/23 0820 08/18/23 0858  BP: (!) 139/91 137/89  Pulse: 72   Temp: 97.7 F (36.5 C)   TempSrc: Oral   SpO2: 96%   Weight: 186 lb 6.4 oz (84.6 kg)   Height: 5\' 11"  (1.803 m)   PainSc: 9     Body mass index is 26 kg/m.     08/18/2023    8:34 AM 03/12/2023    8:51 PM 04/05/2016    1:43 AM 04/04/2016    7:49 PM 03/17/2016    7:49 PM  Advanced Directives  Does Patient Have a Medical Advance Directive? No No No No No  Would patient like information on creating a medical advance directive? No - Patient declined No - Patient declined No - patient declined information No - patient declined information No - patient declined information    Current Medications (verified) Outpatient Encounter Medications as of 08/18/2023  Medication Sig   albuterol (VENTOLIN HFA) 108 (90 Base) MCG/ACT inhaler Inhale 2 puffs into the lungs every 4 (four) hours as needed for wheezing or shortness of breath.   budesonide-formoterol (SYMBICORT) 80-4.5 MCG/ACT inhaler Inhale 2 puffs into the lungs 2 (two) times daily.   fluticasone (FLONASE) 50 MCG/ACT nasal spray Place 2 sprays into both nostrils daily.   furosemide (LASIX) 40 MG tablet PLEASE SEE ATTACHED FOR DETAILED DIRECTIONS   gabapentin (NEURONTIN) 300 MG capsule TAKE 1 CAPSULE (300 MG TOTAL) BY MOUTH 2 (TWO) TIMES DAILY. (Patient taking differently: Take 300 mg by mouth 2  (two) times daily. TAKE 1 CAPSULE (300 MG TOTAL) BY MOUTH 2 (TWO) TIMES DAILY.)   loratadine (CLARITIN) 10 MG tablet Take 10 mg by mouth daily.   montelukast (SINGULAIR) 10 MG tablet Take 1 tablet (10 mg total) by mouth at bedtime.   nicotine (NICODERM CQ - DOSED IN MG/24 HOURS) 21 mg/24hr patch Place 1 patch (21 mg total) onto the skin daily.   potassium chloride (MICRO-K) 10 MEQ CR capsule SMARTSIG:1 Capsule(s) By Mouth   predniSONE (DELTASONE) 20 MG tablet Take 20 mg by mouth daily with breakfast. Take 2 tablets (40mg  total) by mouth daily with breakfast for 3 days   tamsulosin (FLOMAX) 0.4 MG CAPS capsule Take 0.4 mg by mouth daily.   tiZANidine (ZANAFLEX) 4 MG tablet Take 4 mg by mouth at bedtime.   Vitamin D, Ergocalciferol, (DRISDOL) 1.25 MG (50000 UNIT) CAPS capsule Take 50,000 Units by mouth once a week.   ZTLIDO 1.8 % PTCH    No facility-administered encounter medications on file as of 08/18/2023.    Allergies (verified) Patient has no known allergies.   History: Past Medical History:  Diagnosis Date   Anxiety    takes approximately 10 diazepam/month-contracts to no more than that amount   BPH (benign prostatic hypertrophy)    sees urology   Colon cancer [facility]) 2003   specialist due for repeat colonoscopy in 2012   Depression    History of kidney stones    Hyperlipidemia  Insomnia    Multiple fractures 08/2008   patient fell off a rood about 13 feet onto concrete-multiple fractures including vertrebral   Overactive bladder    Past Surgical History:  Procedure Laterality Date   COLECTOMY  02/2002   sigmoid colectomy   ELBOW SURGERY     HERNIA REPAIR     HIP FRACTURE SURGERY     Family History  Problem Relation Age of Onset   Coronary artery disease Father    Other Neg Hx        no FH od colon cancer   Social History   Socioeconomic History   Marital status: Divorced    Spouse name: Not on file   Number of children: Not on file   Years of education: Not  on file   Highest education level: Not on file  Occupational History   Not on file  Tobacco Use   Smoking status: Some Days    Current packs/day: 0.25    Types: Cigarettes   Smokeless tobacco: Never   Tobacco comments:    quit tobacco 2000 smoked for 20 years  Substance and Sexual Activity   Alcohol use: No   Drug use: No   Sexual activity: Not on file  Other Topics Concern   Not on file  Social History Narrative   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   Divorced (but ex-wife is currently living with patient during the temp disability)   no children      quit tob 2000 - smoked for 20 yrs    Alcohol use-no  (former drinker)     Social Determinants of Health   Financial Resource Strain: Low Risk  (08/18/2023)   Overall Financial Resource Strain (CARDIA)    Difficulty of Paying Living Expenses: Not hard at all  Food Insecurity: No Food Insecurity (08/18/2023)   Hunger Vital Sign    Worried About Running Out of Food in the Last Year: Never true    Ran Out of Food in the Last Year: Never true  Transportation Needs: No Transportation Needs (08/18/2023)   PRAPARE - Administrator, Civil Service (Medical): No    Lack of Transportation (Non-Medical): No  Physical Activity: Sufficiently Active (08/18/2023)   Exercise Vital Sign    Days of Exercise per Week: 7 days    Minutes of Exercise per Session: 40 min  Stress: Stress Concern Present (08/18/2023)   [employer] of Occupational Health - Occupational Stress Questionnaire    Feeling of Stress : Very much  Social Connections: Socially Isolated (08/18/2023)   Social Connection and Isolation Panel [NHANES]    Frequency of Communication with Friends and Family: More than three times a week    Frequency of Social Gatherings with Friends and Family: Twice a week    Attends Religious Services: Never    Database administrator or Organizations: No    Attends Hospital doctor: Never    Marital Status: Divorced    Tobacco Counseling Ready to quit: Not Answered Counseling given: Not Answered Tobacco comments: quit tobacco 2000 smoked for 20 years   Clinical Intake:  Pre-visit preparation completed: Yes  Pain : 0-10 Pain Score: 9  Pain Type: Chronic pain Pain Location: Other (Comment) (pain in multiple places of body due to arthritis) Pain Descriptors / Indicators: Constant, Aching, Throbbing Pain Onset: Other (comment) (chronic pain due to arthritis that has been going on for awhile) Pain Frequency:  Constant Pain Relieving Factors: hydrocodone pain medication Effect of Pain on Daily Activities: slows him down some when trying to do daily activities  Pain Relieving Factors: hydrocodone pain medication  BMI - recorded: 26 Nutritional Status: BMI 25 -29 Overweight Nutritional Risks: None Diabetes: No  How often do you need to have someone help you when you read instructions, pamphlets, or other written materials from your doctor or pharmacy?: 1 - Never What is the last grade level you completed in school?: 12th grade  Interpreter Needed?: No  Information entered by :: Cristy Hilts, CMA   Activities of Daily Living    08/18/2023    8:31 AM  In your present state of health, do you have any difficulty performing the following activities:  Hearing? 0  Vision? 1  Difficulty concentrating or making decisions? 0  Walking or climbing stairs? 1  Dressing or bathing? 0  Doing errands, shopping? 0  Preparing Food and eating ? N  Using the Toilet? N  In the past six months, have you accidently leaked urine? Y  Do you have problems with loss of bowel control? N  Managing your Medications? N  Managing your Finances? N  Housekeeping or managing your Housekeeping? N    Patient Care Team: Alyson Reedy, FNP as PCP - General (Family Medicine) Lucianne Lei, MD as Referring Physician (Internal Medicine)  Indicate any recent Medical  Services you may have received from other than [facility] providers in the past year (date may be approximate).     Assessment:   This is a routine wellness examination for Justin [name].  Hearing/Vision screen No results found.   Goals Addressed   None   Depression Screen    08/18/2023    8:36 AM 07/08/2023    8:56 AM 03/24/2023   10:17 AM  PHQ 2/9 Scores  PHQ - 2 Score 0 0 1  PHQ- 9 Score   8    Fall Risk    08/18/2023    8:34 AM  Fall Risk   Falls in the past year? 1  Number falls in past yr: 0  Injury with Fall? 1  Risk for fall due to : No Fall Risks  Follow up Falls evaluation completed    MEDICARE RISK AT HOME: Medicare Risk at Home Any stairs in or around the home?: No If so, are there any without handrails?: No Home free of loose throw rugs in walkways, pet beds, electrical cords, etc?: Yes Adequate lighting in your home to reduce risk of falls?: Yes Life alert?: No Use of a cane, walker or w/c?: No Grab bars in the bathroom?: No Shower chair or bench in shower?: No Elevated toilet seat or a handicapped toilet?: No    Cognitive Function:    08/18/2023    8:36 AM  MMSE - Mini Mental State Exam  Orientation to time 5  Orientation to Place 5  Registration 3  Attention/ Calculation 5  Recall 3  Language- name 2 objects 2  Language- repeat 1  Language- follow 3 step command 3  Language- read & follow direction 1  Write a sentence 1  Copy design 1  Total score 30        08/18/2023    8:37 AM  6CIT Screen  What Year? 0 points  What month? 0 points  What time? 0 points  Count back from 20 0 points  Months in reverse 4 points  Repeat phrase 0 points  Total Score 4 points  Immunizations Immunization History  Administered Date(s) Administered   Fluzone Influenza virus vaccine,trivalent (IIV3), split virus 06/12/2010, 07/23/2010   Influenza Split 07/14/2011, 07/13/2012   Influenza Whole 06/12/2010, 07/23/2010   Influenza,inj,Quad PF,6+ Mos  10/04/2013, 07/04/2014, 07/21/2015   Influenza-Unspecified 10/04/2013, 07/04/2014, 07/21/2015   Td 09/14/2009   Zoster, Live 05/14/2016    TDAP status: Completed at today's visit  Flu Vaccine status: Completed at today's visit  Pneumococcal vaccine status: Declined,  Education has been provided regarding the importance of this vaccine but patient still declined. Advised may receive this vaccine at local pharmacy or Health Dept. Aware to provide a copy of the vaccination record if obtained from local pharmacy or Health Dept. Verbalized acceptance and understanding.   Covid-19 vaccine status: Declined, Education has been provided regarding the importance of this vaccine but patient still declined. Advised may receive this vaccine at local pharmacy or Health Dept.or vaccine clinic. Aware to provide a copy of the vaccination record if obtained from local pharmacy or Health Dept. Verbalized acceptance and understanding.  Qualifies for Shingles Vaccine? Yes   Zostavax completed No   Shingrix Completed?: No.    Education has been provided regarding the importance of this vaccine. Patient has been advised to call insurance company to determine out of pocket expense if they have not yet received this vaccine. Advised may also receive vaccine at local pharmacy or Health Dept. Verbalized acceptance and understanding.  Screening Tests Health Maintenance  Topic Date Due   COVID-19 Vaccine (1) Never done   Pneumonia Vaccine 25+ Years old (1 of 2 - PCV) Never done   Zoster Vaccines- Shingrix (1 of 2) 10/18/1973   DTaP/Tdap/Td (2 - Tdap) 09/15/2019   INFLUENZA VACCINE  05/21/2023   Medicare Annual Wellness (AWV)  08/17/2024   Colonoscopy  02/23/2033   Hepatitis C Screening  Completed   HPV VACCINES  Aged Out   Lung Cancer Screening  Discontinued    Health Maintenance  Health Maintenance Due  Topic Date Due   COVID-19 Vaccine (1) Never done   Pneumonia Vaccine 55+ Years old (1 of 2 - PCV) Never  done   Zoster Vaccines- Shingrix (1 of 2) 10/18/1973   DTaP/Tdap/Td (2 - Tdap) 09/15/2019   INFLUENZA VACCINE  05/21/2023    Colorectal cancer screening: No longer required.   Lung Cancer Screening: (Low Dose CT Chest recommended if Age 3-80 years, 20 pack-year currently smoking OR have quit w/in 15years.) does not qualify.    Additional Screening:  Hepatitis C Screening: does not qualify; Completed   Vision Screening: Recommended annual ophthalmology exams for early detection of glaucoma and other disorders of the eye. Is the patient up to date with their annual eye exam?  Yes  Who is the provider or what is the name of the office in which the patient attends annual eye exams? [facility]	[place] [facility] If pt is not established with a provider, would they like to be referred to a provider to establish care? No .   Dental Screening: Recommended annual dental exams for proper oral hygiene  Community Resource Referral / Chronic Care Management: CRR required this visit?  No   CCM required this visit?  No     Plan:     I have personally reviewed and noted the following in the patient's chart:   Medical and social history Use of alcohol, tobacco or illicit drugs  Current medications and supplements including opioid prescriptions.  Functional ability and status Nutritional status Physical activity Advanced directives  List of other physicians Hospitalizations, surgeries, and ER visits in previous 12 months Vitals Screenings to include cognitive, depression, and falls Referrals and appointments  In addition, I have reviewed and discussed with patient certain preventive protocols, quality metrics, and best practice recommendations. A written personalized care plan for preventive services as well as general preventive health recommendations were provided to patient.     Nayquan Evinger, Farley Ly, CMA   08/18/2023   After Visit Summary: (In Person-Printed) AVS printed and given to the  patient  Nurse Notes: Tdap and influenza vaccines administered during the visit.

## 2023-08-19 DIAGNOSIS — N529 Male erectile dysfunction, unspecified: Secondary | ICD-10-CM | POA: Diagnosis not present

## 2023-09-10 ENCOUNTER — Encounter (HOSPITAL_BASED_OUTPATIENT_CLINIC_OR_DEPARTMENT_OTHER): Payer: Self-pay | Admitting: Family Medicine

## 2023-09-15 DIAGNOSIS — G894 Chronic pain syndrome: Secondary | ICD-10-CM | POA: Diagnosis not present

## 2023-09-15 DIAGNOSIS — Z1389 Encounter for screening for other disorder: Secondary | ICD-10-CM | POA: Diagnosis not present

## 2023-09-15 DIAGNOSIS — M549 Dorsalgia, unspecified: Secondary | ICD-10-CM | POA: Diagnosis not present

## 2023-09-15 DIAGNOSIS — S32000A Wedge compression fracture of unspecified lumbar vertebra, initial encounter for closed fracture: Secondary | ICD-10-CM | POA: Diagnosis not present

## 2023-09-15 DIAGNOSIS — M169 Osteoarthritis of hip, unspecified: Secondary | ICD-10-CM | POA: Diagnosis not present

## 2023-09-15 DIAGNOSIS — J441 Chronic obstructive pulmonary disease with (acute) exacerbation: Secondary | ICD-10-CM | POA: Diagnosis not present

## 2023-09-15 DIAGNOSIS — Z79899 Other long term (current) drug therapy: Secondary | ICD-10-CM | POA: Diagnosis not present

## 2023-09-23 DIAGNOSIS — J309 Allergic rhinitis, unspecified: Secondary | ICD-10-CM | POA: Diagnosis not present

## 2023-09-23 DIAGNOSIS — Z6827 Body mass index (BMI) 27.0-27.9, adult: Secondary | ICD-10-CM | POA: Diagnosis not present

## 2023-09-23 DIAGNOSIS — E785 Hyperlipidemia, unspecified: Secondary | ICD-10-CM | POA: Diagnosis not present

## 2023-09-23 DIAGNOSIS — Z85038 Personal history of other malignant neoplasm of large intestine: Secondary | ICD-10-CM | POA: Diagnosis not present

## 2023-09-23 DIAGNOSIS — R0781 Pleurodynia: Secondary | ICD-10-CM | POA: Diagnosis not present

## 2023-09-23 DIAGNOSIS — K529 Noninfective gastroenteritis and colitis, unspecified: Secondary | ICD-10-CM | POA: Diagnosis not present

## 2023-09-23 DIAGNOSIS — G894 Chronic pain syndrome: Secondary | ICD-10-CM | POA: Diagnosis not present

## 2023-09-23 DIAGNOSIS — M7989 Other specified soft tissue disorders: Secondary | ICD-10-CM | POA: Diagnosis not present

## 2023-09-23 DIAGNOSIS — J449 Chronic obstructive pulmonary disease, unspecified: Secondary | ICD-10-CM | POA: Diagnosis not present

## 2023-09-23 DIAGNOSIS — I10 Essential (primary) hypertension: Secondary | ICD-10-CM | POA: Diagnosis not present

## 2023-09-24 DIAGNOSIS — K449 Diaphragmatic hernia without obstruction or gangrene: Secondary | ICD-10-CM | POA: Diagnosis not present

## 2023-09-24 DIAGNOSIS — J90 Pleural effusion, not elsewhere classified: Secondary | ICD-10-CM | POA: Diagnosis not present

## 2023-09-24 DIAGNOSIS — R0781 Pleurodynia: Secondary | ICD-10-CM | POA: Diagnosis not present

## 2023-09-25 DIAGNOSIS — S20211A Contusion of right front wall of thorax, initial encounter: Secondary | ICD-10-CM | POA: Diagnosis not present

## 2023-09-25 DIAGNOSIS — R0781 Pleurodynia: Secondary | ICD-10-CM | POA: Diagnosis not present

## 2023-09-25 DIAGNOSIS — Y9289 Other specified places as the place of occurrence of the external cause: Secondary | ICD-10-CM | POA: Diagnosis not present

## 2023-09-25 DIAGNOSIS — F17200 Nicotine dependence, unspecified, uncomplicated: Secondary | ICD-10-CM | POA: Diagnosis not present

## 2023-09-25 DIAGNOSIS — G8929 Other chronic pain: Secondary | ICD-10-CM | POA: Diagnosis not present

## 2023-09-25 DIAGNOSIS — Z79899 Other long term (current) drug therapy: Secondary | ICD-10-CM | POA: Diagnosis not present

## 2023-09-25 DIAGNOSIS — Y990 Civilian activity done for income or pay: Secondary | ICD-10-CM | POA: Diagnosis not present

## 2023-09-25 DIAGNOSIS — I10 Essential (primary) hypertension: Secondary | ICD-10-CM | POA: Diagnosis not present

## 2023-09-25 DIAGNOSIS — Y9389 Activity, other specified: Secondary | ICD-10-CM | POA: Diagnosis not present

## 2023-09-25 DIAGNOSIS — X500XXA Overexertion from strenuous movement or load, initial encounter: Secondary | ICD-10-CM | POA: Diagnosis not present

## 2023-09-25 DIAGNOSIS — F1120 Opioid dependence, uncomplicated: Secondary | ICD-10-CM | POA: Diagnosis not present

## 2023-10-07 ENCOUNTER — Encounter (HOSPITAL_BASED_OUTPATIENT_CLINIC_OR_DEPARTMENT_OTHER): Payer: Self-pay | Admitting: Family Medicine

## 2023-10-07 ENCOUNTER — Ambulatory Visit (INDEPENDENT_AMBULATORY_CARE_PROVIDER_SITE_OTHER): Payer: Medicare HMO | Admitting: Family Medicine

## 2023-10-07 VITALS — BP 141/87 | HR 69 | Ht 71.0 in | Wt 179.7 lb

## 2023-10-07 DIAGNOSIS — I10 Essential (primary) hypertension: Secondary | ICD-10-CM | POA: Diagnosis not present

## 2023-10-07 DIAGNOSIS — Z8639 Personal history of other endocrine, nutritional and metabolic disease: Secondary | ICD-10-CM

## 2023-10-07 DIAGNOSIS — N529 Male erectile dysfunction, unspecified: Secondary | ICD-10-CM

## 2023-10-07 DIAGNOSIS — I7143 Infrarenal abdominal aortic aneurysm, without rupture: Secondary | ICD-10-CM | POA: Diagnosis not present

## 2023-10-07 DIAGNOSIS — Z720 Tobacco use: Secondary | ICD-10-CM | POA: Diagnosis not present

## 2023-10-07 MED ORDER — TADALAFIL 5 MG PO TABS
5.0000 mg | ORAL_TABLET | Freq: Every day | ORAL | 1 refills | Status: DC
Start: 2023-10-07 — End: 2024-04-18

## 2023-10-07 MED ORDER — NICOTINE 21 MG/24HR TD PT24: 21.0000 mg | MEDICATED_PATCH | Freq: Every day | TRANSDERMAL | 0 refills | Status: DC

## 2023-10-07 NOTE — Progress Notes (Signed)
Established Patient Office Visit  Subjective  Patient ID: Justin [name], male    DOB: May 16, 1956  Age: 67 y.o. MRN: [id number]  Chief Complaint  Patient presents with   Erectile Dysfunction    Patient wants to discuss Viagra or Cialis    Hypertension   Justin [name] is a 67 year old male patient who would like to discuss erectile dysfunction. He has no additional concerns today. Patient denies "heart issues." Does report elevated cholesterol in the past but is not on any lipid lowering medication. Denies chest pain, previous heart attack, heart failure, shortness of breath, lightheadedness/dizziness, changes to vision during physical activity, or one-sided weakness. Patient reports he has not been checking his BP at home.   Review of Dr. Verita Lamb note (vascular) from 05/05/2023: incidental infrarenal AAA without rupture, measuring 35mm. Repeat in 3 years with repeat aortic duplex.     Review of Systems  Constitutional:  Negative for malaise/fatigue.  Eyes:  Negative for blurred vision and double vision.  Respiratory:  Negative for cough and shortness of breath.   Cardiovascular:  Negative for chest pain, palpitations, orthopnea, claudication, leg swelling and PND.  Neurological:  Negative for dizziness, sensory change, speech change and headaches.  Psychiatric/Behavioral:  Negative for depression and suicidal ideas.       Objective:     BP (!) 141/87   Pulse 69   Ht 5\' 11"  (1.803 m)   Wt 179 lb 11.2 oz (81.5 kg)   SpO2 99%   BMI 25.06 kg/m  BP Readings from Last 3 Encounters:  10/07/23 (!) 141/87  08/18/23 137/89  07/08/23 (!) 142/80    Physical Exam Vitals reviewed.  Constitutional:      Appearance: Normal appearance.  Cardiovascular:     Rate and Rhythm: Normal rate and regular rhythm.     Pulses: Normal pulses.     Heart sounds: Normal heart sounds.  Pulmonary:     Effort: Pulmonary effort is normal.     Breath sounds: Normal breath sounds.   Musculoskeletal:     Right lower leg: No edema.     Left lower leg: No edema.  Neurological:     Mental Status: He is alert.  Psychiatric:        Mood and Affect: Mood normal.        Behavior: Behavior normal.       Assessment & Plan:   1. Primary hypertension (Primary) Patient presents today with slightly elevated blood pressure, repeat with improved diastolic reading. BP is borderline and continues to be. Patient in no acute distress and is well-appearing. Denies chest pain, shortness of breath, lower extremity edema, vision changes, headaches. Cardiovascular exam with heart regular rate and rhythm. Normal heart sounds, no murmurs present. No lower extremity edema present. Lungs clear to auscultation bilaterally. Patient is not currently on pharmacotherapy. Discussed lifestyle modifications- including low sodium diet and daily exercise. Advised patient to closely monitor blood pressure at home. Return to office sooner if blood pressure begins to increase greater than 140/90.  - Basic Metabolic Panel (BMET)  2. Erectile dysfunction, unspecified erectile dysfunction type Patient has concerns about erectile dysfunction. He reports he has a difficult time getting and maintaining an erection. He reports he has been on Cialis in the past and has tolerated the medication well. Recent EKG reviewed, with no contraindications. Discussed adverse side effects with medication, reactions with certain medications/substances, and when to stop taking medication. Emergency precautions reviewed with patient. Patient verbalized understanding and does not have  any questions at this time.  - Lipid Profile - Hemoglobin A1c - TSH Rfx on Abnormal to Free T4 - Ambulatory referral to Cardiology - Basic Metabolic Panel (BMET) - tadalafil (CIALIS) 5 MG tablet; Take 1 tablet (5 mg total) by mouth daily.  Dispense: 30 tablet; Refill: 1  3. Infrarenal abdominal aortic aneurysm (AAA) without rupture (HCC) Review of  chart- patient has a history of an AAA without rupture measuring 35mm in size. He continues to remain asymptomatic. Dr. Verita Lamb note stated the following recommendations: abstain from tobacco products, A1c <7%, BP <140/90, LDL-C <100 mg/dL, and preventative aspirin 81mg  every day and atorvastatin 40-80mg  every day. Patient needs repeat aortic duplex in 3 years (04/2026) per Dr. Verita Lamb note. Patient does not have a cardiologist. Feel patient would benefit from additional management and evaluation from cardiac specialist. Non-urgent referral placed today.   - Lipid Profile - Hemoglobin A1c - TSH Rfx on Abnormal to Free T4 - Ambulatory referral to Cardiology - Basic Metabolic Panel (BMET)  4. History of hyperlipidemia Patient reports history of elevated cholesterol. Recent lipid panel not in chart. Plan to obtain fasting lipid profile today and treat accordingly.   - Lipid Profile  5. Tobacco use Patient would ike nicoderm patch refill. Order placed today.  - nicotine (NICODERM CQ - DOSED IN MG/24 HOURS) 21 mg/24hr patch; Place 1 patch (21 mg total) onto the skin daily.  Dispense: 28 patch; Refill: 0  Return in about 3 months (around 01/05/2024) for HTN follow-up.   Spent 60 minutes on this patient encounter, including preparation, chart review, face-to-face counseling with patient and coordination of care, and documentation of encounter.   Alyson Reedy, FNP

## 2023-10-07 NOTE — Patient Instructions (Signed)
*  Go to the emergency room if you have an erection lasting more than 4 hours *Do not take medication with alcohol or nitrates, as this can cause dangerously low blood pressure *If you start to develop any chest pain at any time, please stop the medication and schedule an appointment.  *I still replaced a cardiology referral so that they can follow your AAA.

## 2023-10-08 LAB — BASIC METABOLIC PANEL
BUN/Creatinine Ratio: 12 (ref 10–24)
BUN: 11 mg/dL (ref 8–27)
CO2: 25 mmol/L (ref 20–29)
Calcium: 9.3 mg/dL (ref 8.6–10.2)
Chloride: 103 mmol/L (ref 96–106)
Creatinine, Ser: 0.95 mg/dL (ref 0.76–1.27)
Glucose: 89 mg/dL (ref 70–99)
Potassium: 4.5 mmol/L (ref 3.5–5.2)
Sodium: 142 mmol/L (ref 134–144)
eGFR: 87 mL/min/{1.73_m2} (ref >59)

## 2023-10-08 LAB — LIPID PANEL
Chol/HDL Ratio: 3.8 {ratio} (ref 0.0–5.0)
Cholesterol, Total: 153 mg/dL (ref 100–199)
HDL: 40 mg/dL (ref >39)
LDL Chol Calc (NIH): 99 mg/dL (ref 0–99)
Triglycerides: 73 mg/dL (ref 0–149)
VLDL Cholesterol Cal: 14 mg/dL (ref 5–40)

## 2023-10-08 LAB — HEMOGLOBIN A1C
Est. average glucose Bld gHb Est-mCnc: 117 mg/dL
Hgb A1c MFr Bld: 5.7 % — ABNORMAL HIGH (ref 4.8–5.6)

## 2023-10-08 LAB — TSH RFX ON ABNORMAL TO FREE T4: TSH: 0.532 u[IU]/mL (ref 0.450–4.500)

## 2023-10-15 DIAGNOSIS — J441 Chronic obstructive pulmonary disease with (acute) exacerbation: Secondary | ICD-10-CM | POA: Diagnosis not present

## 2023-10-28 ENCOUNTER — Ambulatory Visit: Payer: Medicare HMO | Attending: Cardiology | Admitting: Cardiology

## 2023-10-28 ENCOUNTER — Encounter: Payer: Self-pay | Admitting: Cardiology

## 2023-10-28 VITALS — BP 130/72 | HR 64 | Ht 71.0 in | Wt 185.6 lb

## 2023-10-28 DIAGNOSIS — M940 Chondrocostal junction syndrome [Tietze]: Secondary | ICD-10-CM

## 2023-10-28 DIAGNOSIS — R0609 Other forms of dyspnea: Secondary | ICD-10-CM | POA: Diagnosis not present

## 2023-10-28 DIAGNOSIS — M169 Osteoarthritis of hip, unspecified: Secondary | ICD-10-CM | POA: Diagnosis not present

## 2023-10-28 DIAGNOSIS — F17200 Nicotine dependence, unspecified, uncomplicated: Secondary | ICD-10-CM | POA: Diagnosis not present

## 2023-10-28 DIAGNOSIS — J449 Chronic obstructive pulmonary disease, unspecified: Secondary | ICD-10-CM | POA: Diagnosis not present

## 2023-10-28 DIAGNOSIS — Z79899 Other long term (current) drug therapy: Secondary | ICD-10-CM | POA: Diagnosis not present

## 2023-10-28 DIAGNOSIS — Z1389 Encounter for screening for other disorder: Secondary | ICD-10-CM | POA: Diagnosis not present

## 2023-10-28 DIAGNOSIS — I10 Essential (primary) hypertension: Secondary | ICD-10-CM

## 2023-10-28 DIAGNOSIS — R030 Elevated blood-pressure reading, without diagnosis of hypertension: Secondary | ICD-10-CM

## 2023-10-28 DIAGNOSIS — E785 Hyperlipidemia, unspecified: Secondary | ICD-10-CM

## 2023-10-28 DIAGNOSIS — S32000A Wedge compression fracture of unspecified lumbar vertebra, initial encounter for closed fracture: Secondary | ICD-10-CM | POA: Diagnosis not present

## 2023-10-28 DIAGNOSIS — S20219A Contusion of unspecified front wall of thorax, initial encounter: Secondary | ICD-10-CM

## 2023-10-28 DIAGNOSIS — G894 Chronic pain syndrome: Secondary | ICD-10-CM | POA: Diagnosis not present

## 2023-10-28 DIAGNOSIS — M549 Dorsalgia, unspecified: Secondary | ICD-10-CM | POA: Diagnosis not present

## 2023-10-28 HISTORY — DX: Chondrocostal junction syndrome (tietze): M94.0

## 2023-10-28 HISTORY — DX: Contusion of unspecified front wall of thorax, initial encounter: S20.219A

## 2023-10-28 HISTORY — DX: Essential (primary) hypertension: I10

## 2023-10-28 NOTE — Progress Notes (Signed)
 Cardiology Consultation:    Date:  10/28/2023   ID:  Justin [name], DOB [birth year], MRN [id number]  PCP:  Towana Small, FNP  Cardiologist:  Lamar Fitch, MD   Referring MD: Towana Small, FNP   Chief Complaint  Patient presents with   Medication Management    BP meds initiation    History of Present Illness:    Justin [name] is a 68 y.o. male who is being seen today for the evaluation of hypertension at the request of Towana Small, FNP.  Past medical history significant for essential hypertension, history of colon cancer, depression, he is a chronic smoker he was referred to us  for evaluation of his high blood pressure.  He said he has been struggling with blood pressure for years however never been taking any medications.  He was given Lasix but he does not know why.  He works as a corporate investment banker and he said he is busy he can walk climb stairs have no difficulty doing it.  He smokes he smokes all his life with some breaks the time when he was in prison.  Smokes about 1 pack/day.  When I will ask him about how he is doing this year compared to last year he is much better because last year he was in prison now when he is out he is able to work walk and exercise and doing well.  He does have exertional shortness of breath.  He does have family history of premature coronary artery disease his brother, he does not exercise in any structured fashion, he is not on any special diet.  He does have blood pressure monitor at home but does not check his blood pressure on the regular basis.  He had difficulty sleeping all his life he is an anxious person.  Never been checked for sleep apnea  Past Medical History:  Diagnosis Date   Anxiety    takes approximately 10 diazepam /month-contracts to no more than that amount   BPH (benign prostatic hypertrophy)    sees urology   Colon cancer [facility]) 2003   specialist due for repeat colonoscopy in 2012   Depression    History of  kidney stones    Hyperlipidemia    Insomnia    Multiple fractures 08/2008   patient fell off a rood about 13 feet onto concrete-multiple fractures including vertrebral   Overactive bladder     Past Surgical History:  Procedure Laterality Date   COLECTOMY  02/2002   sigmoid colectomy   ELBOW SURGERY     HERNIA REPAIR     HIP FRACTURE SURGERY      Current Medications: Current Meds  Medication Sig   albuterol  (VENTOLIN  HFA) 108 (90 Base) MCG/ACT inhaler Inhale 2 puffs into the lungs every 4 (four) hours as needed for wheezing or shortness of breath.   budesonide -formoterol  (SYMBICORT ) 80-4.5 MCG/ACT inhaler Inhale 2 puffs into the lungs 2 (two) times daily.   fluticasone  (FLONASE ) 50 MCG/ACT nasal spray Place 2 sprays into both nostrils daily.   furosemide (LASIX) 40 MG tablet Take 40 mg by mouth daily.   gabapentin  (NEURONTIN ) 300 MG capsule TAKE 1 CAPSULE (300 MG TOTAL) BY MOUTH 2 (TWO) TIMES DAILY. (Patient taking differently: Take 300 mg by mouth 2 (two) times daily. TAKE 1 CAPSULE (300 MG TOTAL) BY MOUTH 2 (TWO) TIMES DAILY.)   HYDROcodone -acetaminophen  (NORCO) 10-325 MG tablet Take 0.5-1 tablets by mouth 3 (three) times daily.   loratadine (CLARITIN) 10 MG tablet Take 10  mg by mouth daily.   montelukast  (SINGULAIR ) 10 MG tablet Take 1 tablet (10 mg total) by mouth at bedtime.   nicotine  (NICODERM CQ  - DOSED IN MG/24 HOURS) 21 mg/24hr patch Place 1 patch (21 mg total) onto the skin daily.   rosuvastatin (CRESTOR) 10 MG tablet Take 10 mg by mouth at bedtime.   tadalafil  (CIALIS ) 5 MG tablet Take 1 tablet (5 mg total) by mouth daily.   tamsulosin (FLOMAX) 0.4 MG CAPS capsule Take 0.4 mg by mouth daily.   tiZANidine (ZANAFLEX) 4 MG tablet Take 4 mg by mouth at bedtime.   Vitamin D, Ergocalciferol, (DRISDOL) 1.25 MG (50000 UNIT) CAPS capsule Take 50,000 Units by mouth once a week.   ZTLIDO  1.8 % PTCH Apply 1 patch topically every 3 (three) days.   [DISCONTINUED] potassium chloride   (MICRO-K ) 10 MEQ CR capsule Take 10 mEq by mouth daily.   [DISCONTINUED] predniSONE  (DELTASONE ) 20 MG tablet Take 20 mg by mouth daily with breakfast. Take 2 tablets (40mg  total) by mouth daily with breakfast for 3 days     Allergies:   Patient has no known allergies.   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   Divorced (but ex-wife is currently living with patient during the temp disability)   no children      quit tob 2000 - smoked for 20 yrs    Alcohol use-no  (former drinker)     Social Drivers of Corporate Investment Banker Strain: Low Risk  (08/18/2023)   Overall Financial Resource Strain (CARDIA)    Difficulty of Paying Living Expenses: Not hard at all  Food Insecurity: No Food Insecurity (08/18/2023)   Hunger Vital Sign    Worried About Running Out of Food in the Last Year: Never true    Ran Out of Food in the Last Year: Never true  Transportation Needs: No Transportation Needs (08/18/2023)   PRAPARE - Administrator, Civil Service (Medical): No    Lack of Transportation (Non-Medical): No  Physical Activity: Sufficiently Active (08/18/2023)   Exercise Vital Sign    Days of Exercise per Week: 7 days    Minutes of Exercise per Session: 40 min  Stress: Stress Concern Present (08/18/2023)   [employer] of Occupational Health - Occupational Stress  Questionnaire    Feeling of Stress : Very much  Social Connections: Socially Isolated (08/18/2023)   Social Connection and Isolation Panel [NHANES]    Frequency of Communication with Friends and Family: More than three times a week    Frequency of Social Gatherings with Friends and Family: Twice a week    Attends Religious Services: Never    Database Administrator or Organizations: No  Attends Banker Meetings: Never    Marital Status: Divorced     Family History: The patient's family history includes Coronary artery disease in his father. There is no history of Other. ROS:   Please see the history of present illness.    All 14 point review of systems negative except as described per history of present illness.  EKGs/Labs/Other Studies Reviewed:    The following studies were reviewed today:   EKG:       Recent Labs: 03/12/2023: B Natriuretic Peptide 24.7 03/24/2023: ALT 11 04/08/2023: Hemoglobin 14.5; Platelets 247 10/07/2023: BUN 11; Creatinine, Ser 0.95; Potassium 4.5; Sodium 142; TSH 0.532  Recent Lipid Panel    Component Value Date/Time   CHOL 153 10/07/2023 0906   TRIG 73 10/07/2023 0906   HDL 40 10/07/2023 0906   CHOLHDL 3.8 10/07/2023 0906   CHOLHDL 4 01/07/2016 1130   VLDL 13.6 01/07/2016 1130   LDLCALC 99 10/07/2023 0906    Physical Exam:    VS:  BP 130/72 (BP Location: Left Arm, Patient Position: Sitting, Cuff Size: Normal)   Pulse 64   Ht 5' 11 (1.803 m)   Wt 185 lb 9.6 oz (84.2 kg)   SpO2 93%   BMI 25.89 kg/m     Wt Readings from Last 3 Encounters:  10/28/23 185 lb 9.6 oz (84.2 kg)  10/07/23 179 lb 11.2 oz (81.5 kg)  08/18/23 186 lb 6.4 oz (84.6 kg)     GEN:  Well nourished, well developed in no acute distress HEENT: Normal NECK: No JVD; No carotid bruits LYMPHATICS: No lymphadenopathy CARDIAC: RRR, no murmurs, no rubs, no gallops RESPIRATORY:  Clear to auscultation without rales, wheezing or rhonchi  ABDOMEN: Soft, non-tender,  non-distended MUSCULOSKELETAL:  No edema; No deformity  SKIN: Warm and dry NEUROLOGIC:  Alert and oriented x 3 PSYCHIATRIC:  Normal affect   ASSESSMENT:    1. Elevated blood pressure reading without diagnosis of hypertension   2. Essential hypertension   3. Smoking   4. Dyslipidemia   5. Chronic obstructive pulmonary disease, unspecified COPD type (HCC)    PLAN:    In order of problems listed above:  Elevated blood pressure however today 130/72 no indication to treat yet however that you Monitoring.  Ask him to check blood pressure every single day or maybe every other day different days different time of the day.  I gave the instruction that him to sit down relax before he check his blood pressure.  I asked him to bring results to so we can check and see if he need to have any treatment. Dyspnea on exertion: I will schedule him to have echocardiogram done the purpose of it will be to check left ventricle ejection fraction as well as look for potential evidence of left ventricle hypertrophy. Dyslipidemia I did review K PN which show me data from October 07, 2023 with LDL of 99 HDL 40.  I will ask him to have calcium score and based on that we decide if we need to initiate any therapy for his coronary disease. Smoking we spent at least 5 minutes talking about this he tells me right away that he is not interested in quitting I explained to him what the risk of smoking but I do not think honestly I was able to convince him. COPD already present because of smoking.   Medication Adjustments/Labs and Tests Ordered: Current medicines are reviewed at length with the patient today.  Concerns regarding medicines are outlined  above.  Orders Placed This Encounter  Procedures   EKG 12-Lead   No orders of the defined types were placed in this encounter.   Signed, Lamar DOROTHA Fitch, MD, [facility]. 10/28/2023 10:10 AM    [facility]

## 2023-10-28 NOTE — Patient Instructions (Signed)
 Medication Instructions:  Your physician recommends that you continue on your current medications as directed. Please refer to the Current Medication list given to you today.  *If you need a refill on your cardiac medications before your next appointment, please call your pharmacy*   Lab Work: None Ordered If you have labs (blood work) drawn today and your tests are completely normal, you will receive your results only by: MyChart Message (if you have MyChart) OR A paper copy in the mail If you have any lab test that is abnormal or we need to change your treatment, we will call you to review the results.   Testing/Procedures: We will order CT coronary calcium score. It will cost $99.00 and iis due at time of scan.  Please call to schedule.    [facility]   Echocardiogram An echocardiogram is a test that uses sound waves (ultrasound) to produce images of the heart. Images from an echocardiogram can provide important information about: Heart size and shape. The size and thickness and movement of your heart's walls. Heart muscle function and strength. Heart valve function or if you have stenosis. Stenosis is when the heart valves are too narrow. If blood is flowing backward through the heart valves (regurgitation). A tumor or infectious growth around the heart valves. Areas of heart muscle that are not working well because of poor blood flow or injury from a heart attack. Aneurysm detection. An aneurysm is a weak or damaged part of an artery wall. The wall bulges out from the normal force of blood pumping through the body. Tell a health care provider about: Any allergies you have. All medicines you are taking, including vitamins, herbs, eye drops, creams, and over-the-counter medicines. Any blood disorders you have. Any surgeries you have had. Any medical conditions you have. Whether you are pregnant or may be pregnant. What are the risks? Generally, this is a safe test.  However, problems may occur, including an allergic reaction to dye (contrast) that may be used during the test. What happens before the test? No specific preparation is needed. You may eat and drink normally. What happens during the test?  You will take off your clothes from the waist up and put on a hospital gown. Electrodes or electrocardiogram (ECG)patches may be placed on your chest. The electrodes or patches are then connected to a device that monitors your heart rate and rhythm. You will lie down on a table for an ultrasound exam. A gel will be applied to your chest to help sound waves pass through your skin. A handheld device, called a transducer, will be pressed against your chest and moved over your heart. The transducer produces sound waves that travel to your heart and bounce back (or echo back) to the transducer. These sound waves will be captured in real-time and changed into images of your heart that can be viewed on a video monitor. The images will be recorded on a computer and reviewed by your health care provider. You may be asked to change positions or hold your breath for a short time. This makes it easier to get different views or better views of your heart. In some cases, you may receive contrast through an IV in one of your veins. This can improve the quality of the pictures from your heart. The procedure may vary among health care providers and hospitals. What can I expect after the test? You may return to your normal, everyday life, including diet, activities, and medicines, unless your health  care provider tells you not to do that. Follow these instructions at home: It is up to you to get the results of your test. Ask your health care provider, or the department that is doing the test, when your results will be ready. Keep all follow-up visits. This is important. Summary An echocardiogram is a test that uses sound waves (ultrasound) to produce images of the heart. Images  from an echocardiogram can provide important information about the size and shape of your heart, heart muscle function, heart valve function, and other possible heart problems. You do not need to do anything to prepare before this test. You may eat and drink normally. After the echocardiogram is completed, you may return to your normal, everyday life, unless your health care provider tells you not to do that. This information is not intended to replace advice given to you by your health care provider. Make sure you discuss any questions you have with your health care provider. Document Revised: 06/19/2021 Document Reviewed: 05/29/2020 Elsevier Patient Education  2023 Elsevier Inc.      Follow-Up: At [facility], you and your health needs are our priority.  As part of our continuing mission to provide you with exceptional heart care, we have created designated Provider Care Teams.  These Care Teams include your primary Cardiologist (physician) and Advanced Practice Providers (APPs -  Physician Assistants and Nurse Practitioners) who all work together to provide you with the care you need, when you need it.  We recommend signing up for the patient portal called MyChart.  Sign up information is provided on this After Visit Summary.  MyChart is used to connect with patients for Virtual Visits (Telemedicine).  Patients are able to view lab/test results, encounter notes, upcoming appointments, etc.  Non-urgent messages can be sent to your provider as well.   To learn more about what you can do with MyChart, go to [email or web address].    Your next appointment:   2 month follow up

## 2023-10-29 DIAGNOSIS — R351 Nocturia: Secondary | ICD-10-CM | POA: Diagnosis not present

## 2023-10-29 DIAGNOSIS — N529 Male erectile dysfunction, unspecified: Secondary | ICD-10-CM | POA: Diagnosis not present

## 2023-10-29 DIAGNOSIS — Z125 Encounter for screening for malignant neoplasm of prostate: Secondary | ICD-10-CM | POA: Diagnosis not present

## 2023-10-29 DIAGNOSIS — N401 Enlarged prostate with lower urinary tract symptoms: Secondary | ICD-10-CM | POA: Diagnosis not present

## 2023-11-05 ENCOUNTER — Ambulatory Visit (HOSPITAL_BASED_OUTPATIENT_CLINIC_OR_DEPARTMENT_OTHER)
Admission: RE | Admit: 2023-11-05 | Discharge: 2023-11-05 | Disposition: A | Discharge status: Home / Self Care | Payer: Self-pay | Source: Ambulatory Visit | Attending: Cardiology | Admitting: Cardiology

## 2023-11-05 DIAGNOSIS — E785 Hyperlipidemia, unspecified: Secondary | ICD-10-CM | POA: Insufficient documentation

## 2023-11-12 ENCOUNTER — Ambulatory Visit: Payer: Medicare HMO | Attending: Cardiology

## 2023-11-12 DIAGNOSIS — R0609 Other forms of dyspnea: Secondary | ICD-10-CM

## 2023-11-12 LAB — ECHOCARDIOGRAM COMPLETE: S' Lateral: 2.9 cm

## 2023-11-13 ENCOUNTER — Telehealth: Payer: Self-pay

## 2023-11-13 NOTE — Telephone Encounter (Signed)
-----   Message from Gypsy Balsam sent at 11/13/2023  2:28 PM EST ----- Echocardiogram showed normal left ventricle ejection fraction, moderate concentric LVH, mild mitral valve regurgitation mild dilatation of the ascending aorta but overall looks fine

## 2023-11-13 NOTE — Telephone Encounter (Signed)
Patient notified of results and verbalized understanding.

## 2023-11-15 DIAGNOSIS — J441 Chronic obstructive pulmonary disease with (acute) exacerbation: Secondary | ICD-10-CM | POA: Diagnosis not present

## 2023-11-16 DIAGNOSIS — H25043 Posterior subcapsular polar age-related cataract, bilateral: Secondary | ICD-10-CM | POA: Diagnosis not present

## 2023-11-16 DIAGNOSIS — H2512 Age-related nuclear cataract, left eye: Secondary | ICD-10-CM | POA: Diagnosis not present

## 2023-11-16 DIAGNOSIS — H2513 Age-related nuclear cataract, bilateral: Secondary | ICD-10-CM | POA: Diagnosis not present

## 2023-11-16 DIAGNOSIS — H25013 Cortical age-related cataract, bilateral: Secondary | ICD-10-CM | POA: Diagnosis not present

## 2023-11-26 ENCOUNTER — Telehealth: Payer: Self-pay

## 2023-11-26 DIAGNOSIS — G894 Chronic pain syndrome: Secondary | ICD-10-CM | POA: Diagnosis not present

## 2023-11-26 DIAGNOSIS — M549 Dorsalgia, unspecified: Secondary | ICD-10-CM | POA: Diagnosis not present

## 2023-11-26 DIAGNOSIS — M169 Osteoarthritis of hip, unspecified: Secondary | ICD-10-CM | POA: Diagnosis not present

## 2023-11-26 DIAGNOSIS — S32000A Wedge compression fracture of unspecified lumbar vertebra, initial encounter for closed fracture: Secondary | ICD-10-CM | POA: Diagnosis not present

## 2023-11-26 DIAGNOSIS — Z79899 Other long term (current) drug therapy: Secondary | ICD-10-CM | POA: Diagnosis not present

## 2023-11-26 DIAGNOSIS — Z1389 Encounter for screening for other disorder: Secondary | ICD-10-CM | POA: Diagnosis not present

## 2023-11-26 NOTE — Telephone Encounter (Signed)
 Pt viewed Ca Score results on My Chart per Dr. Vanetta Shawl note. Routed to PCP.

## 2023-12-07 ENCOUNTER — Other Ambulatory Visit (HOSPITAL_BASED_OUTPATIENT_CLINIC_OR_DEPARTMENT_OTHER): Payer: Self-pay | Admitting: Family Medicine

## 2023-12-07 DIAGNOSIS — N529 Male erectile dysfunction, unspecified: Secondary | ICD-10-CM

## 2023-12-16 DIAGNOSIS — J441 Chronic obstructive pulmonary disease with (acute) exacerbation: Secondary | ICD-10-CM | POA: Diagnosis not present

## 2023-12-17 DIAGNOSIS — R0781 Pleurodynia: Secondary | ICD-10-CM | POA: Diagnosis not present

## 2023-12-17 DIAGNOSIS — J432 Centrilobular emphysema: Secondary | ICD-10-CM | POA: Diagnosis not present

## 2023-12-23 DIAGNOSIS — Z1389 Encounter for screening for other disorder: Secondary | ICD-10-CM | POA: Diagnosis not present

## 2023-12-23 DIAGNOSIS — G894 Chronic pain syndrome: Secondary | ICD-10-CM | POA: Diagnosis not present

## 2023-12-23 DIAGNOSIS — Z79899 Other long term (current) drug therapy: Secondary | ICD-10-CM | POA: Diagnosis not present

## 2023-12-23 DIAGNOSIS — S32000A Wedge compression fracture of unspecified lumbar vertebra, initial encounter for closed fracture: Secondary | ICD-10-CM | POA: Diagnosis not present

## 2023-12-23 DIAGNOSIS — M549 Dorsalgia, unspecified: Secondary | ICD-10-CM | POA: Diagnosis not present

## 2023-12-23 DIAGNOSIS — M169 Osteoarthritis of hip, unspecified: Secondary | ICD-10-CM | POA: Diagnosis not present

## 2023-12-28 ENCOUNTER — Ambulatory Visit: Payer: Medicare HMO | Admitting: Cardiology

## 2024-01-13 DIAGNOSIS — J441 Chronic obstructive pulmonary disease with (acute) exacerbation: Secondary | ICD-10-CM | POA: Diagnosis not present

## 2024-01-20 DIAGNOSIS — S32000A Wedge compression fracture of unspecified lumbar vertebra, initial encounter for closed fracture: Secondary | ICD-10-CM | POA: Diagnosis not present

## 2024-01-20 DIAGNOSIS — Z1389 Encounter for screening for other disorder: Secondary | ICD-10-CM | POA: Diagnosis not present

## 2024-01-20 DIAGNOSIS — M549 Dorsalgia, unspecified: Secondary | ICD-10-CM | POA: Diagnosis not present

## 2024-01-20 DIAGNOSIS — M169 Osteoarthritis of hip, unspecified: Secondary | ICD-10-CM | POA: Diagnosis not present

## 2024-01-22 ENCOUNTER — Encounter: Payer: Self-pay | Admitting: Cardiology

## 2024-01-22 NOTE — Progress Notes (Deleted)
 Cardiology Office Note:  .   Date:  01/23/2024  ID:  Justin [name], DOB May 12, 1956, MRN [id number] PCP: Alyson Reedy, FNP  [facility] Providers Cardiologist:  Justin Balsam, MD { Click to update primary MD,subspecialty MD or APP then REFRESH:1}   History of Present Illness: [name] Kitchen   Justin [name] is a 68 y.o. male with a past medical history of hypertension, COPD, history of colon cancer, IBS, BPH, dyslipidemia, history of tobacco abuse.  11/12/2023 echo EF 60 to 65%, moderate concentric LVH, grade 1 DD, mild MR, aortic valve sclerosis is present without stenosis, mild dilatation of the ascending aorta 39 mm 11/05/2023 calcium score 1410, 91st percentile  He established care with Dr. Bing Matter on 10/28/2023 at the behest of his PCP for evaluation of hypertension, he had been struggling for some time trying to control it.  He had some DOE that was felt to be multifactorial related to history of smoking, an echo was arranged which revealed a mild dilatation of the ascending aorta but overall preserved EF.  A calcium score which is range which was elevated at 1410.  He stays active as a Architect.  Start on aspirin, LDL 99 12/19, repeat CMET, FLP and LPA  ROS: ROS   Studies Reviewed: .        Cardiac Studies & Procedures   ______________________________________________________________________________________________   STRESS TESTS  MYOCARDIAL PERFUSION IMAGING 01/15/2016  Narrative  Nuclear stress EF: 62%.  There was no ST segment deviation noted during stress.  The study is normal.  This is a low risk study.  The left ventricular ejection fraction is normal (55-65%).  There was a slight perfusion defect of the inferior wall both at rest and stress. Given the normal systolic function and excellent exercise capacity, this is likely due to diaphragmatic attenuation.   ECHOCARDIOGRAM  ECHOCARDIOGRAM COMPLETE 11/12/2023  Narrative ECHOCARDIOGRAM  REPORT    Patient Name:   Justin [name] Date of Exam: 11/12/2023 Medical Rec #:  [id number]         Height:       71.0 in Accession #:    [id number]        Weight:       185.6 lb Date of Birth:  1956/09/08        BSA:          2.043 m Patient Age:    50 years          BP:           130/72 mmHg Patient Gender: M                 HR:           70 bpm. Exam Location:  [facility]  Procedure: 2D Echo, Cardiac Doppler, Color Doppler and Strain Analysis  Indications:    DOE (dyspnea on exertion) [R06.09 (ICD-10-CM)]  History:        Patient has no prior history of Echocardiogram examinations. COPD; Risk Factors:Hypertension, Smoking and Dyslipidemia.  Sonographer:    Louie Boston RDCS Referring Phys: [phone] Marveen Reeks KRASOWSKI  IMPRESSIONS   1. Left ventricular ejection fraction, by estimation, is 60 to 65%. The left ventricle has normal function. The left ventricle has no regional wall motion abnormalities. There is moderate concentric left ventricular hypertrophy. Left ventricular diastolic parameters are consistent with Grade I diastolic dysfunction (impaired relaxation). The average left ventricular global longitudinal strain is 16.8 %. The global longitudinal strain is abnormal. 2. Right ventricular  systolic function is normal. The right ventricular size is normal. There is normal pulmonary artery systolic pressure. 3. The mitral valve is degenerative. Mild mitral valve regurgitation. No evidence of mitral stenosis. 4. The aortic valve is tricuspid. Aortic valve regurgitation is not visualized. Aortic valve sclerosis is present, with no evidence of aortic valve stenosis. 5. There is mild dilatation of the ascending aorta, measuring 39 mm. 6. The inferior vena cava is dilated in size with >50% respiratory variability, suggesting right atrial pressure of 8 mmHg.  FINDINGS Left Ventricle: Left ventricular ejection fraction, by estimation, is 60 to 65%. The left ventricle has normal  function. The left ventricle has no regional wall motion abnormalities. The average left ventricular global longitudinal strain is 16.8 %. The global longitudinal strain is abnormal. The left ventricular internal cavity size was normal in size. There is moderate concentric left ventricular hypertrophy. Left ventricular diastolic parameters are consistent with Grade I diastolic dysfunction (impaired relaxation). Normal left ventricular filling pressure.  Right Ventricle: The right ventricular size is normal. No increase in right ventricular wall thickness. Right ventricular systolic function is normal. There is normal pulmonary artery systolic pressure. The tricuspid regurgitant velocity is 1.68 m/s, and with an assumed right atrial pressure of 8 mmHg, the estimated right ventricular systolic pressure is 19.3 mmHg.  Left Atrium: Left atrial size was normal in size.  Right Atrium: Right atrial size was normal in size.  Pericardium: There is no evidence of pericardial effusion.  Mitral Valve: The mitral valve is degenerative in appearance. Mild mitral annular calcification. Mild mitral valve regurgitation. No evidence of mitral valve stenosis.  Tricuspid Valve: The tricuspid valve is normal in structure. Tricuspid valve regurgitation is trivial. No evidence of tricuspid stenosis.  Aortic Valve: The aortic valve is tricuspid. Aortic valve regurgitation is not visualized. Aortic valve sclerosis is present, with no evidence of aortic valve stenosis.  Pulmonic Valve: The pulmonic valve was normal in structure. Pulmonic valve regurgitation is mild. No evidence of pulmonic stenosis.  Aorta: The aortic arch was not well visualized and the aortic root is normal in size and structure. There is mild dilatation of the ascending aorta, measuring 39 mm.  Venous: The inferior vena cava is dilated in size with greater than 50% respiratory variability, suggesting right atrial pressure of 8 mmHg.  IAS/Shunts:  No atrial level shunt detected by color flow Doppler.   LEFT VENTRICLE PLAX 2D LVIDd:         4.40 cm   Diastology LVIDs:         2.90 cm   LV e' medial:    5.87 cm/s LV PW:         1.40 cm   LV E/e' medial:  7.8 LV IVS:        1.50 cm   LV e' lateral:   13.20 cm/s LVOT diam:     2.30 cm   LV E/e' lateral: 3.5 LV SV:         79 LV SV Index:   39        2D Longitudinal Strain LVOT Area:     4.15 cm  2D Strain GLS Avg:     16.8 %   RIGHT VENTRICLE             IVC RV Basal diam:  3.30 cm     IVC diam: 2.10 cm RV S prime:     19.40 cm/s TAPSE (M-mode): 2.7 cm  LEFT ATRIUM  Index        RIGHT ATRIUM           Index LA diam:        3.30 cm 1.62 cm/m   RA Area:     15.30 cm LA Vol (A2C):   71.5 ml 35.00 ml/m  RA Volume:   40.30 ml  19.73 ml/m LA Vol (A4C):   55.0 ml 26.93 ml/m LA Biplane Vol: 65.2 ml 31.92 ml/m AORTIC VALVE LVOT Vmax:   102.30 cm/s LVOT Vmean:  63.650 cm/s LVOT VTI:    0.190 m  AORTA Ao Root diam: 3.40 cm Ao Asc diam:  3.85 cm Ao Desc diam: 2.90 cm  MV E velocity: 45.60 cm/s  TRICUSPID VALVE MV A velocity: 66.00 cm/s  TR Peak grad:   11.3 mmHg MV E/A ratio:  0.69        TR Vmax:        168.00 cm/s  SHUNTS Systemic VTI:  0.19 m Systemic Diam: 2.30 cm  Norman Herrlich MD Electronically signed by Norman Herrlich MD Signature Date/Time: 11/12/2023/5:28:01 PM    Final      CT SCANS  CT CARDIAC SCORING (SELF PAY ONLY) 11/05/2023  Addendum 11/18/2023 12:19 AM ADDENDUM REPORT: 11/18/2023 00:16  EXAM: OVER-READ INTERPRETATION  CT CHEST  The following report is an over-read performed by radiologist Dr. Alcide Clever of [facility] on 11/18/2023. This over-read does not include interpretation of cardiac or coronary anatomy or pathology. The coronary calcium score interpretation by the cardiologist is attached.  COMPARISON:  None.  FINDINGS: Cardiovascular: Mild atherosclerotic calcifications of the aorta  are noted.  Mediastinum/Nodes: There are no enlarged lymph nodes within the visualized mediastinum.  Lungs/Pleura: There is no pleural effusion. Lungs are well aerated bilaterally. Diffuse emphysematous changes are seen. No acute abnormality is seen.  Upper abdomen: Large hiatal hernia is noted. Right renal cyst is seen. No follow-up is recommended.  Musculoskeletal/Chest wall: No chest wall mass or suspicious osseous findings within the visualized chest.  IMPRESSION: Large hiatal hernia.  Aortic Atherosclerosis (ICD10-I70.0) and Emphysema (ICD10-J43.9).   Electronically Signed By: Alcide Clever M.D. On: 11/18/2023 00:16  Narrative : CLINICAL DATA:  Cardiovascular Disease Risk stratification  EXAM:  Coronary Calcium Score  TECHNIQUE:  A gated, non-contrast computed tomography scan of the heart was  performed using 3mm slice thickness. Axial images were analyzed on a  dedicated workstation. Calcium scoring of the coronary arteries was  performed using the Agatston method.  FINDINGS:  Coronary Calcium Score:  Left main: 2.29  Left anterior descending artery: 660  Left circumflex artery: 61  Right coronary artery: 687  Total: 1410  Percentile: 91  Pericardium: Normal.  Ascending Aorta: Normal caliber.  Minimal calcifications of the aortic valve noted.  Pulmonary artery: Normal caliber  Non-cardiac: See separate report from [facility].  IMPRESSION:  Coronary calcium score of 1410. This was 1 percentile for age-, race-,  and sex-matched controls.  RECOMMENDATIONS:  Coronary artery calcium (CAC) score is a strong predictor of  incident coronary heart disease (CHD) and provides predictive  information beyond traditional risk factors. CAC scoring is  reasonable to use in the decision to withhold, postpone, or initiate  statin therapy in intermediate-risk or selected borderline-risk  asymptomatic adults (age 7-75 years and  LDL-C >=70 to <190 mg/dL)  who do not have diabetes or established atherosclerotic  cardiovascular disease (ASCVD).* In intermediate-risk (10-year ASCVD  risk >=7.5% to <20%) adults or selected borderline-risk (10-year  ASCVD  risk >=5% to <7.5%) adults in whom a CAC score is measured for  the purpose of making a treatment decision the following  recommendations have been made:  If CAC=0, it is reasonable to withhold statin therapy and reassess  in 5 to 10 years, as long as higher risk conditions are absent  (diabetes mellitus, family history of premature CHD in first degree  relatives (males <55 years; females <65 years), cigarette smoking,  or LDL >=190 mg/dL).  If CAC is 1 to 99, it is reasonable to initiate statin therapy for  patients >=44 years of age.  If CAC is >=100 or >=75th percentile, it is reasonable to initiate  statin therapy at any age.  Cardiology referral should be considered for patients with CAC  scores >=400 or >=75th percentile.  *2018 AHA/ACC/AACVPR/AAPA/ABC/ACPM/ADA/AGS/APhA/ASPC/NLA/PCNA  Guideline on the Management of Blood Cholesterol: A Report of the  American College of Cardiology/American Heart Association Task Force  on Clinical Practice Guidelines. J Am Coll Cardiol.  2019;73(24):3168-3209.  Electronically Signed: By: Justin [name] M.D. On: 11/11/2023 11:36     ______________________________________________________________________________________________      Risk Assessment/Calculations:     No BP recorded.  {Refresh Note OR Click here to enter BP  :1}***       Physical Exam:   VS:  There were no vitals taken for this visit.   Wt Readings from Last 3 Encounters:  10/28/23 185 lb 9.6 oz (84.2 kg)  10/07/23 179 lb 11.2 oz (81.5 kg)  08/18/23 186 lb 6.4 oz (84.6 kg)    GEN: Well nourished, well developed in no acute distress NECK: No JVD; No carotid bruits CARDIAC: ***RRR, no murmurs, rubs, gallops RESPIRATORY:   Clear to auscultation without rales, wheezing or rhonchi  ABDOMEN: Soft, non-tender, non-distended EXTREMITIES:  No edema; No deformity   ASSESSMENT AND PLAN: .   CAD - calcium score 1410, 91st percentile HTN -  Tobacco abuse -  Dyslipidemia -  continue Crestor 10 mg daily,     {Are you ordering a CV Procedure (e.g. stress test, cath, DCCV, TEE, etc)?   Press F2        :[id number]}  Dispo: ***  Signed, Flossie Dibble, NP

## 2024-01-25 ENCOUNTER — Encounter (HOSPITAL_BASED_OUTPATIENT_CLINIC_OR_DEPARTMENT_OTHER): Payer: Self-pay | Admitting: Student

## 2024-01-25 ENCOUNTER — Ambulatory Visit: Attending: Cardiology | Admitting: Cardiology

## 2024-01-25 ENCOUNTER — Ambulatory Visit (HOSPITAL_BASED_OUTPATIENT_CLINIC_OR_DEPARTMENT_OTHER): Admitting: Student

## 2024-01-25 DIAGNOSIS — Z720 Tobacco use: Secondary | ICD-10-CM

## 2024-01-25 DIAGNOSIS — G8929 Other chronic pain: Secondary | ICD-10-CM | POA: Diagnosis not present

## 2024-01-25 DIAGNOSIS — I2510 Atherosclerotic heart disease of native coronary artery without angina pectoris: Secondary | ICD-10-CM

## 2024-01-25 DIAGNOSIS — M25562 Pain in left knee: Secondary | ICD-10-CM

## 2024-01-25 DIAGNOSIS — I10 Essential (primary) hypertension: Secondary | ICD-10-CM

## 2024-01-25 DIAGNOSIS — E782 Mixed hyperlipidemia: Secondary | ICD-10-CM

## 2024-01-25 MED ORDER — TRIAMCINOLONE ACETONIDE 40 MG/ML IJ SUSP
2.0000 mL | INTRAMUSCULAR | Status: AC | PRN
  Administered 2024-01-25: 2 mL via INTRA_ARTICULAR

## 2024-01-25 MED ORDER — LIDOCAINE HCL 1 % IJ SOLN
4.0000 mL | INTRAMUSCULAR | Status: AC | PRN
  Administered 2024-01-25: 4 mL

## 2024-01-25 NOTE — Progress Notes (Signed)
 Chief Complaint: Left knee pain     History of Present Illness:   01/25/24: Patient presents to clinic today for follow-up of left knee pain.  States that cortisone injection performed in October 2024 did give him some relief, however he has been working a Paediatric nurse houses and this has brought his knee pain back.  His knee does feel swollen and pain is over the medial aspect.  Denies any locking, catching, popping, or buckling.  He has tried lidocaine patches but denies any other treatments.  No recent injuries.   08/18/23: Justin [name] is a 68 y.o. male presenting today with pain in his left knee and ankle.  Knee pain began about 6 months ago with no known injury.  This is bothering him the most today.  Pain is located in the medial knee and is moderate in severity on average.  Denies any previous knee pain or injuries.  Has tried Ztildo patches with no relief.  Also reports pain and swelling in his ankle today that is been chronic for many years.  He has injured this ankle many times in the past while participating in motocross.  He is followed by pain management and is prescribed hydrocodone.  Denies any numbness or tingling.  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   Surgical History:   Heel fracture surgery - 11 years ago  PMH/PSH/Family History/Social History/Meds/Allergies:    Past Medical History:  Diagnosis Date   Anxiety    takes approximately 10 diazepam/month-contracts to no more than that amount   BPH (benign prostatic hypertrophy)    sees urology   CAD (coronary artery disease)    calcium score 1410   Colon cancer (HCC) 10/20/2001   specialist due for repeat colonoscopy in 2012   Depression    History of kidney stones    Hyperlipidemia    Insomnia    Multiple fractures 08/20/2008   patient fell off a rood about 13 feet onto concrete-multiple fractures including vertrebral    Overactive bladder    Past Surgical History:  Procedure Laterality Date   COLECTOMY  02/2002   sigmoid colectomy   ELBOW SURGERY     HERNIA REPAIR     HIP FRACTURE SURGERY     Social History   Socioeconomic History   Marital status: Divorced    Spouse name: Not on file   Number of children: Not on file   Years of education: Not on file   Highest education level: Not on file  Occupational History   Not on file  Tobacco Use   Smoking status: Some Days    Current packs/day: 0.25    Types: Cigarettes   Smokeless tobacco: Never   Tobacco comments:    quit tobacco 2000 smoked for 20 years  Substance and Sexual Activity   Alcohol use: No   Drug use: No   Sexual activity: Not on file  Other Topics Concern   Not on file  Social History Narrative   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   Divorced (but ex-wife is currently living with patient during the temp disability)   no children      quit tob 2000 - smoked for 20 yrs    Alcohol use-no  (  former drinker)     Social Drivers of Corporate investment banker Strain: Low Risk  (08/18/2023)   Overall Financial Resource Strain (CARDIA)    Difficulty of Paying Living Expenses: Not hard at all  Food Insecurity: No Food Insecurity (08/18/2023)   Hunger Vital Sign    Worried About Running Out of Food in the Last Year: Never true    Ran Out of Food in the Last Year: Never true  Transportation Needs: No Transportation Needs (08/18/2023)   PRAPARE - Administrator, Civil Service (Medical): No    Lack of Transportation (Non-Medical): No  Physical Activity: Sufficiently Active (08/18/2023)   Exercise Vital Sign    Days of Exercise per Week: 7 days    Minutes of Exercise per Session: 40 min  Stress: Stress Concern Present (08/18/2023)   [employer] of Occupational Health - Occupational Stress Questionnaire    Feeling of Stress : Very much  Social Connections: Socially Isolated  (08/18/2023)   Social Connection and Isolation Panel [NHANES]    Frequency of Communication with Friends and Family: More than three times a week    Frequency of Social Gatherings with Friends and Family: Twice a week    Attends Religious Services: Never    Database administrator or Organizations: No    Attends Engineer, structural: Never    Marital Status: Divorced   Family History  Problem Relation Age of Onset   Coronary artery disease Father    Other Neg Hx        no FH od colon cancer   No Known Allergies Current Outpatient Medications  Medication Sig Dispense Refill   albuterol (VENTOLIN HFA) 108 (90 Base) MCG/ACT inhaler Inhale 2 puffs into the lungs every 4 (four) hours as needed for wheezing or shortness of breath. 8 g 1   budesonide-formoterol (SYMBICORT) 80-4.5 MCG/ACT inhaler Inhale 2 puffs into the lungs 2 (two) times daily. 10.2 g 3   fluticasone (FLONASE) 50 MCG/ACT nasal spray Place 2 sprays into both nostrils daily. 16 g 3   furosemide (LASIX) 40 MG tablet Take 40 mg by mouth daily.     gabapentin (NEURONTIN) 300 MG capsule TAKE 1 CAPSULE (300 MG TOTAL) BY MOUTH 2 (TWO) TIMES DAILY. (Patient taking differently: Take 300 mg by mouth 2 (two) times daily. TAKE 1 CAPSULE (300 MG TOTAL) BY MOUTH 2 (TWO) TIMES DAILY.) 60 capsule 2   HYDROcodone-acetaminophen (NORCO) 10-325 MG tablet Take 0.5-1 tablets by mouth 3 (three) times daily.     loratadine (CLARITIN) 10 MG tablet Take 10 mg by mouth daily.     montelukast (SINGULAIR) 10 MG tablet Take 1 tablet (10 mg total) by mouth at bedtime. 30 tablet 1   nicotine (NICODERM CQ - DOSED IN MG/24 HOURS) 21 mg/24hr patch Place 1 patch (21 mg total) onto the skin daily. 28 patch 0   rosuvastatin (CRESTOR) 10 MG tablet Take 10 mg by mouth at bedtime.     tadalafil (CIALIS) 5 MG tablet Take 1 tablet (5 mg total) by mouth daily. 30 tablet 1   tamsulosin (FLOMAX) 0.4 MG CAPS capsule Take 0.4 mg by mouth daily.     tiZANidine  (ZANAFLEX) 4 MG tablet Take 4 mg by mouth at bedtime.     Vitamin D, Ergocalciferol, (DRISDOL) 1.25 MG (50000 UNIT) CAPS capsule Take 50,000 Units by mouth once a week.     ZTLIDO 1.8 % PTCH Apply 1 patch topically every 3 (three) days.  No current facility-administered medications for this visit.   No results found.  Review of Systems:   A ROS was performed including pertinent positives and negatives as documented in the HPI.  Physical Exam :   Constitutional: NAD and appears stated age Neurological: Alert and oriented Psych: Appropriate affect and cooperative There were no vitals taken for this visit.   Comprehensive Musculoskeletal Exam:    Left knee exam demonstrates tenderness palpation over the medial joint line.  Active range of motion from 0 to 130 degrees with palpable crepitus.  Mild effusion present without overlying erythema or warmth.  No instability with varus or valgus stress.  Imaging:    Assessment:   68 y.o. male with chronic left knee pain.  Previous x-rays from October 2024 appear to only show some mild degenerative changes most notable in the patellofemoral compartment.  The majority of his pain does continue to be medial and there is an effusion present on today's exam.  He did get 4 to 5 months of relief from cortisone injection at last visit, so I have offered to repeat this today.  Injection was performed of the left knee and he tolerated the procedure well.  Will continue to monitor for ongoing symptoms and discussed we could consider further workup with MRI or gel injections should symptoms return.  Plan :    -Cortisone injection performed of the left knee today -Start meloxicam 15 mg for 1 week -Return to clinic as needed    Procedure Note  Patient: Justin [name]             Date of Birth: 1956-09-05           MRN: [id number]             Visit Date: 01/25/2024  Procedures: Visit Diagnoses:  1. Chronic pain of left knee      Large Joint  Inj: L knee on 01/25/2024 3:47 PM Indications: pain Details: 22 G 1.5 in needle, anterolateral approach Medications: 4 mL lidocaine 1 %; 2 mL triamcinolone acetonide 40 MG/ML Outcome: tolerated well, no immediate complications Procedure, treatment alternatives, risks and benefits explained, specific risks discussed. Consent was given by the patient. Immediately prior to procedure a time out was called to verify the correct patient, procedure, equipment, support staff and site/side marked as required. Patient was prepped and draped in the usual sterile fashion.      I personally saw and evaluated the patient, and participated in the management and treatment plan.  Hazle Nordmann, PA-C Orthopedics

## 2024-01-26 ENCOUNTER — Encounter (HOSPITAL_BASED_OUTPATIENT_CLINIC_OR_DEPARTMENT_OTHER): Payer: Self-pay | Admitting: Student

## 2024-01-26 ENCOUNTER — Ambulatory Visit (HOSPITAL_BASED_OUTPATIENT_CLINIC_OR_DEPARTMENT_OTHER): Admitting: Student

## 2024-01-26 ENCOUNTER — Other Ambulatory Visit (HOSPITAL_BASED_OUTPATIENT_CLINIC_OR_DEPARTMENT_OTHER): Payer: Self-pay

## 2024-01-26 VITALS — BP 153/92 | HR 62 | Temp 97.9°F | Resp 16 | Ht 68.9 in | Wt 182.4 lb

## 2024-01-26 DIAGNOSIS — K219 Gastro-esophageal reflux disease without esophagitis: Secondary | ICD-10-CM | POA: Diagnosis not present

## 2024-01-26 DIAGNOSIS — J441 Chronic obstructive pulmonary disease with (acute) exacerbation: Secondary | ICD-10-CM

## 2024-01-26 DIAGNOSIS — K449 Diaphragmatic hernia without obstruction or gangrene: Secondary | ICD-10-CM | POA: Insufficient documentation

## 2024-01-26 DIAGNOSIS — Z91199 Patient's noncompliance with other medical treatment and regimen due to unspecified reason: Secondary | ICD-10-CM

## 2024-01-26 DIAGNOSIS — G479 Sleep disorder, unspecified: Secondary | ICD-10-CM | POA: Diagnosis not present

## 2024-01-26 DIAGNOSIS — Z13220 Encounter for screening for lipoid disorders: Secondary | ICD-10-CM

## 2024-01-26 DIAGNOSIS — Z131 Encounter for screening for diabetes mellitus: Secondary | ICD-10-CM

## 2024-01-26 DIAGNOSIS — Z720 Tobacco use: Secondary | ICD-10-CM

## 2024-01-26 DIAGNOSIS — R931 Abnormal findings on diagnostic imaging of heart and coronary circulation: Secondary | ICD-10-CM | POA: Diagnosis not present

## 2024-01-26 DIAGNOSIS — Z7689 Persons encountering health services in other specified circumstances: Secondary | ICD-10-CM | POA: Diagnosis not present

## 2024-01-26 DIAGNOSIS — I700 Atherosclerosis of aorta: Secondary | ICD-10-CM | POA: Diagnosis not present

## 2024-01-26 DIAGNOSIS — Z91148 Patient's other noncompliance with medication regimen for other reason: Secondary | ICD-10-CM | POA: Insufficient documentation

## 2024-01-26 DIAGNOSIS — Z136 Encounter for screening for cardiovascular disorders: Secondary | ICD-10-CM | POA: Diagnosis not present

## 2024-01-26 HISTORY — DX: Gastro-esophageal reflux disease without esophagitis: K21.9

## 2024-01-26 HISTORY — DX: Diaphragmatic hernia without obstruction or gangrene: K44.9

## 2024-01-26 HISTORY — DX: Patient's noncompliance with other medical treatment and regimen due to unspecified reason: Z91.199

## 2024-01-26 HISTORY — DX: Abnormal findings on diagnostic imaging of heart and coronary circulation: R93.1

## 2024-01-26 HISTORY — DX: Sleep disorder, unspecified: G47.9

## 2024-01-26 HISTORY — DX: Atherosclerosis of aorta: I70.0

## 2024-01-26 MED ORDER — ALBUTEROL SULFATE HFA 108 (90 BASE) MCG/ACT IN AERS
2.0000 | INHALATION_SPRAY | RESPIRATORY_TRACT | 1 refills | Status: DC | PRN
  Filled 2024-01-26: qty 6.7, 20d supply, fill #0

## 2024-01-26 MED ORDER — PANTOPRAZOLE SODIUM 40 MG PO TBEC
40.0000 mg | DELAYED_RELEASE_TABLET | Freq: Every day | ORAL | 3 refills | Status: AC
  Filled 2024-01-26: qty 30, 30d supply, fill #0

## 2024-01-26 MED ORDER — BREZTRI AEROSPHERE 160-9-4.8 MCG/ACT IN AERO
2.0000 | INHALATION_SPRAY | Freq: Two times a day (BID) | RESPIRATORY_TRACT | 11 refills | Status: DC
  Filled 2024-01-26 (×2): qty 10.7, 30d supply, fill #0

## 2024-01-26 NOTE — Assessment & Plan Note (Signed)
 Smoking history and interested in nicotine patches. No recent annual low-dose CT scan for lung cancer screening despite smoking history. Discussed potential use of nicotine patches for smoking cessation. - Consider initiating annual low-dose CT scan for lung cancer screening in the future

## 2024-01-26 NOTE — Assessment & Plan Note (Signed)
 Aortic atherosclerosis with calcifications on imaging. Coronary calcium score >90th percentile for age, race, and gender, indicating significant coronary artery disease risk. Family history of heart disease present. Risk of cardiac events discussed. - Refer to cardiologist for further evaluation and management - Ensure he is taking Crestor for cholesterol management

## 2024-01-26 NOTE — Assessment & Plan Note (Signed)
 Large hiatal hernia on CT coronary calcium scan, likely contributing to heartburn symptoms. No current acid-reducing medication. Discussed potential for esophageal cancer if untreated. - Prescribe Protonix for acid reduction - Consider referral to general surgeon if symptoms do not improve with medication

## 2024-01-26 NOTE — Assessment & Plan Note (Signed)
 Not taking prescribed medications consistently, including statins and inhalers. Expressed willingness to improve compliance. Discussed risks of non-compliance, including increased risk for esophageal cancer and cardiac events. - Encourage use of pill organizer and setting alarms for medication reminders - Consider bubble pack medication packaging if compliance does not improve

## 2024-01-26 NOTE — Patient Instructions (Addendum)
 It was nice to see you today!  As we discussed in clinic:  Crestor is your cholesterol medication- PLEASE TAKE THIS DAILY.  No caffeine or nicotine after 4pm. Begin magnesium glycinate 400mg  nightly- 30 minutes before bed to help your sleep. Please bring your meds to your next visit.  Please take your blood pressure at home twice daily for 2 weeks.  Please write down the number for the cardiology office here:  Your cialis is a PDE5 inhibitor. You are currently on or have been started on a medication called a PDE5 inhibitor. These are typically used for obtaining/maintaining an erection, but they may also be used for pulmonary hypertension. Please take this medication as needed before sexual activity. This should not be taken more than once in a 24 hour period. - Please let me know if you are taking nitrates (medications for chest pain) such as isosorbide dinitrate (Isordil), isosorbide mononitrate (Monoket), and nitroglycerin (Nitromist, Nitro-Dur, Nitrostat). Please also abstain form alcohol on this medication. Either of these can cause dangerously low blood pressure. - Please go to the emergency room if you have an erection lasting more than 4 hours. - If you start to develop any chest pain at any time, please stop the medication and contact us or go be seen at the ER.  If you have any problems before your next visit feel free to message me via MyChart (minor issues or questions) or call the office, otherwise you may reach out to schedule an office visit.  Thank you! Gerilyn Pilgrim Ashlee Bewley, PA-C

## 2024-01-26 NOTE — Assessment & Plan Note (Signed)
 Difficulty staying asleep, waking early. History of using Valium for sleep, but discontinued by pain doctor. High caffeine intake throughout the day. Discussed potential impact of caffeine and nicotine on sleep. - Recommend magnesium glycinate for sleep improvement - Advise him to avoid caffeine after 4 PM - Discuss potential impact of nicotine on sleep

## 2024-01-26 NOTE — Assessment & Plan Note (Signed)
 Signs of emphysema on imaging. Reports occasional dyspnea and mucus production. Not using prescribed inhalers or oxygen. Discussed keeping oxygen available at home and potential need for pulmonary function tests. - Prescribe daily inhaler (alternative to Symbicort) - Advise him to keep oxygen available at home for use if needed - Consider pulmonary function tests in the future if symptoms worsen

## 2024-01-26 NOTE — Progress Notes (Addendum)
 New Patient Office Visit  Subjective    Patient ID: Justin [name], male    DOB: November 29, 1955  Age: 68 y.o. MRN: [id number]  CC:  Chief Complaint  Patient presents with   Establish Care    Here to establish care. Just got steroid shot on left knee yesterday.    Medication Refill    Needs refills for nasal spray, inhalers, and nicotine patch.    Rib Injury    Has right rib that keeps bothering him. They did CT on abdomen/pelvis instead of the chest.    Insomnia    Needs something for insmonia. Tried all the rx and valium helped. Getting 2-3 hours of sleep. Working 10 hrs a day.      Discussed the use of AI scribe software for clinical note transcription with the patient, who gave verbal consent to proceed.  History of Present Illness   Justin [name] is a 68 year old male with hypertension and coronary artery disease who presents with concerns about his heart health and medication management.  He has concerns about his heart health and medication management. His blood pressure has been borderline at home, and there is uncertainty about starting antihypertensive medications. He has undergone extensive cardiac workup, including a CT coronary calcium scoring, which revealed a large hiatal hernia and aortic atherosclerosis. He experiences heartburn and a burning sensation in his chest but denies a bad taste in his mouth in the morning. He is not currently on any acid-reducing medication.  He has a history of coronary artery disease with a high coronary calcium score, placing him in the 90th percentile for his age, race, and gender. He has not experienced a heart attack, but his brother and father have a history of heart attacks. He missed a recent cardiology appointment due to work commitments and has been non-compliant with his medications, including a statin.  He has been diagnosed with emphysema and reports occasional shortness of breath, especially when working in high altitudes. He  has not seen a pulmonologist or had pulmonary function tests. He is active, working long hours remodeling houses, and does not use his prescribed home oxygen. He has a history of COPD and uses hydrocodone and gabapentin for pain management. He reports having a lot of mucus and coughing up phlegm regularly. He does not currently have a rescue inhaler and is not on a daily inhaler like Symbicort.  He experiences sleep disturbances, waking up frequently at night, and has a history of using Valium for sleep, which was discontinued by his pain doctor. He consumes caffeine throughout the day and has a history of nicotine use, which he is considering addressing with nicotine patches.  He has a history of knee pain and swelling, for which he recently received a cortisone shot. He has a steel plate in his leg, which he does not believe contributes to the swelling.     Infrarenal AAA without rupture- 35mm in size since. Sees Dr. Butch Penny. Supposed to be on apsirin and atorvastatin.  Reviewed Ortho note from 01/25/24 and fam med note from 10/07/23.  Screenings:  Colon Cancer: next time. Lung Cancer: next time. Diabetes: indicated HLD: indicated  The 10-year ASCVD risk score (Arnett DK, et al., 2019) is: 30.5%  Outpatient Encounter Medications as of 01/26/2024  Medication Sig   budeson-glycopyrrolate-formoterol (BREZTRI AEROSPHERE) 160-9-4.8 MCG/ACT AERO inhaler Inhale 2 puffs into the lungs 2 (two) times daily.   fluticasone (FLONASE) 50 MCG/ACT nasal spray Place 2 sprays into both  nostrils daily.   furosemide (LASIX) 40 MG tablet Take 40 mg by mouth daily.   gabapentin (NEURONTIN) 300 MG capsule TAKE 1 CAPSULE (300 MG TOTAL) BY MOUTH 2 (TWO) TIMES DAILY. (Patient taking differently: Take 300 mg by mouth 2 (two) times daily. TAKE 1 CAPSULE (300 MG TOTAL) BY MOUTH 2 (TWO) TIMES DAILY.)   gatifloxacin (ZYMAXID) 0.5 % SOLN Place 1 drop into the left eye 4 (four) times daily.   HYDROcodone-acetaminophen (NORCO)  10-325 MG tablet Take 1 tablet by mouth in the morning, at noon, in the evening, and at bedtime.   ketorolac (ACULAR) 0.5 % ophthalmic solution Place 1 drop into the left eye 4 (four) times daily.   loratadine (CLARITIN) 10 MG tablet Take 10 mg by mouth daily.   montelukast (SINGULAIR) 10 MG tablet Take 1 tablet (10 mg total) by mouth at bedtime.   nicotine (NICODERM CQ - DOSED IN MG/24 HOURS) 21 mg/24hr patch Place 1 patch (21 mg total) onto the skin daily.   pantoprazole (PROTONIX) 40 MG tablet Take 1 tablet (40 mg total) by mouth daily.   potassium chloride (MICRO-K) 10 MEQ CR capsule Take 10 mEq by mouth 2 (two) times daily.   prednisoLONE acetate (PRED FORTE) 1 % ophthalmic suspension Place 1 drop into the left eye 4 (four) times daily.   rosuvastatin (CRESTOR) 10 MG tablet Take 10 mg by mouth at bedtime.   tadalafil (CIALIS) 5 MG tablet Take 1 tablet (5 mg total) by mouth daily.   tamsulosin (FLOMAX) 0.4 MG CAPS capsule Take 0.4 mg by mouth daily.   Vitamin D, Ergocalciferol, (DRISDOL) 1.25 MG (50000 UNIT) CAPS capsule Take 50,000 Units by mouth once a week.   ZTLIDO 1.8 % PTCH Apply 1 patch topically every 3 (three) days.   [DISCONTINUED] albuterol (VENTOLIN HFA) 108 (90 Base) MCG/ACT inhaler Inhale 2 puffs into the lungs every 4 (four) hours as needed for wheezing or shortness of breath.   [DISCONTINUED] budesonide-formoterol (SYMBICORT) 80-4.5 MCG/ACT inhaler Inhale 2 puffs into the lungs 2 (two) times daily.   albuterol (VENTOLIN HFA) 108 (90 Base) MCG/ACT inhaler Inhale 2 puffs into the lungs every 4 (four) hours as needed for wheezing or shortness of breath.   tiZANidine (ZANAFLEX) 4 MG tablet Take 4 mg by mouth at bedtime. (Patient not taking: Reported on 01/26/2024)   No facility-administered encounter medications on file as of 01/26/2024.    Past Medical History:  Diagnosis Date   Anxiety    takes approximately 10 diazepam/month-contracts to no more than that amount   BPH (benign  prostatic hypertrophy)    sees urology   CAD (coronary artery disease)    calcium score 1410   Colon cancer (HCC) 10/20/2001   specialist due for repeat colonoscopy in 2012   Depression    History of kidney stones    Hyperlipidemia    Insomnia    Multiple fractures 08/20/2008   patient fell off a rood about 13 feet onto concrete-multiple fractures including vertrebral   Overactive bladder     Past Surgical History:  Procedure Laterality Date   COLECTOMY  02/2002   sigmoid colectomy   ELBOW SURGERY     HERNIA REPAIR     HIP FRACTURE SURGERY      Family History  Problem Relation Age of Onset   Coronary artery disease Father    Other Neg Hx        no FH od colon cancer    Social History   Socioeconomic  History   Marital status: Divorced    Spouse name: Not on file   Number of children: 0   Years of education: Not on file   Highest education level: Not on file  Occupational History   Not on file  Tobacco Use   Smoking status: Some Days    Current packs/day: 0.25    Average packs/day: 0.2 packs/day for 45.3 years (11.3 ttl pk-yrs)    Types: Cigarettes    Start date: 1980    Passive exposure: Current   Smokeless tobacco: Never   Tobacco comments:    Quits, goes back   Vaping Use   Vaping status: Never Used  Substance and Sexual Activity   Alcohol use: No   Drug use: No   Sexual activity: Not on file  Other Topics Concern   Not on file  Social History Narrative   RETIRED   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Divorced (but ex-wife is currently living with patient during the temp disability)no children   quit tob 2000 - smoked for 20 yrs Alcohol use-no  (former drinker)     Social Drivers of Corporate investment banker Strain: Low Risk  (08/18/2023)   Overall Financial Resource Strain (CARDIA)    Difficulty of Paying Living Expenses: Not hard at all  Food Insecurity: No Food Insecurity (01/26/2024)   Hunger Vital Sign     Worried About Running Out of Food in the Last Year: Never true    Ran Out of Food in the Last Year: Never true  Transportation Needs: No Transportation Needs (01/26/2024)   PRAPARE - Administrator, Civil Service (Medical): No    Lack of Transportation (Non-Medical): No  Physical Activity: Sufficiently Active (08/18/2023)   Exercise Vital Sign    Days of Exercise per Week: 7 days    Minutes of Exercise per Session: 40 min  Stress: Stress Concern Present (08/18/2023)   [employer] of Occupational Health - Occupational Stress Questionnaire    Feeling of Stress : Very much  Social Connections: Socially Isolated (08/18/2023)   Social Connection and Isolation Panel [NHANES]    Frequency of Communication with Friends and Family: More than three times a week    Frequency of Social Gatherings with Friends and Family: Twice a week    Attends Religious Services: Never    Database administrator or Organizations: No    Attends Banker Meetings: Never    Marital Status: Divorced  Catering manager Violence: Not At Risk (01/26/2024)   Humiliation, Afraid, Rape, and Kick questionnaire    Fear of Current or Ex-Partner: No    Emotionally Abused: No    Physically Abused: No    Sexually Abused: No    ROS  Per HPI      Objective    BP (!) 153/92   Pulse 62   Temp 97.9 F (36.6 C) (Oral)   Resp 16   Ht 5' 8.9" (1.75 m)   Wt 182 lb 6.4 oz (82.7 kg)   SpO2 94%   BMI 27.02 kg/m   Physical Exam Constitutional:      General: He is not in acute distress.    Appearance: Normal appearance. He is not ill-appearing.  HENT:     Head: Normocephalic and atraumatic.     Right Ear: External ear normal.     Left Ear: External ear normal.     Nose: Nose normal.  Mouth/Throat:     Mouth: Mucous membranes are moist.     Pharynx: Oropharynx is clear.  Eyes:     General: No scleral icterus.    Extraocular Movements: Extraocular movements intact.      Conjunctiva/sclera: Conjunctivae normal.     Pupils: Pupils are equal, round, and reactive to light.  Neck:     Vascular: No carotid bruit.  Cardiovascular:     Rate and Rhythm: Normal rate and regular rhythm.     Pulses: Normal pulses.     Heart sounds: Normal heart sounds. No murmur heard.    No friction rub.  Pulmonary:     Effort: Pulmonary effort is normal. No respiratory distress.     Breath sounds: Rhonchi (RLL) present. No wheezing or rales.  Musculoskeletal:        General: Normal range of motion.     Cervical back: Neck supple.  Skin:    General: Skin is warm and dry.     Coloration: Skin is not jaundiced or pale.  Neurological:     General: No focal deficit present.     Mental Status: He is alert.  Psychiatric:        Mood and Affect: Mood normal.        Behavior: Behavior normal.         Assessment & Plan:   Encounter to establish care  Hiatal hernia Assessment & Plan: Large hiatal hernia on CT coronary calcium scan, likely contributing to heartburn symptoms. No current acid-reducing medication. Discussed potential for esophageal cancer if untreated. - Prescribe Protonix for acid reduction - Consider referral to general surgeon if symptoms do not improve with medication  Orders: -     Pantoprazole Sodium; Take 1 tablet (40 mg total) by mouth daily.  Dispense: 30 tablet; Refill: 3  Gastroesophageal reflux disease without esophagitis Assessment & Plan: Large hiatal hernia on CT coronary calcium scan, likely contributing to heartburn symptoms. No current acid-reducing medication. Discussed potential for esophageal cancer if untreated. - Prescribe Protonix for acid reduction - Consider referral to general surgeon if symptoms do not improve with medication  Orders: -     Pantoprazole Sodium; Take 1 tablet (40 mg total) by mouth daily.  Dispense: 30 tablet; Refill: 3 -     CBC with Differential/Platelet -     Comprehensive metabolic panel with GFR  COPD,  frequent exacerbations (HCC) Assessment & Plan: Signs of emphysema on imaging. Reports occasional dyspnea and mucus production. Not using prescribed inhalers or oxygen. Discussed keeping oxygen available at home and potential need for pulmonary function tests. - Prescribe daily inhaler (alternative to Symbicort) - Advise him to keep oxygen available at home for use if needed - Consider pulmonary function tests in the future if symptoms worsen  Orders: -     Breztri Aerosphere; Inhale 2 puffs into the lungs 2 (two) times daily.  Dispense: 10.7 g; Refill: 11 -     Albuterol Sulfate HFA; Inhale 2 puffs into the lungs every 4 (four) hours as needed for wheezing or shortness of breath.  Dispense: 6.7 g; Refill: 1 -     CBC with Differential/Platelet  Sleep disturbances Assessment & Plan: Difficulty staying asleep, waking early. History of using Valium for sleep, but discontinued by pain doctor. High caffeine intake throughout the day. Discussed potential impact of caffeine and nicotine on sleep. - Recommend magnesium glycinate for sleep improvement - Advise him to avoid caffeine after 4 PM - Discuss potential impact of nicotine on  sleep   Screening for diabetes mellitus -     Hemoglobin A1c  Encounter for lipid screening for cardiovascular disease -     Lipid panel  Aortic atherosclerosis (HCC) Assessment & Plan: Aortic atherosclerosis with calcifications on imaging. Coronary calcium score >90th percentile for age, race, and gender, indicating significant coronary artery disease risk. Family history of heart disease present. Risk of cardiac events discussed. - Refer to cardiologist for further evaluation and management - Ensure he is taking Crestor for cholesterol management   Noncompliance with medication regimen Assessment & Plan: Not taking prescribed medications consistently, including statins and inhalers. Expressed willingness to improve compliance. Discussed risks of  non-compliance, including increased risk for esophageal cancer and cardiac events. - Encourage use of pill organizer and setting alarms for medication reminders - Consider bubble pack medication packaging if compliance does not improve   Tobacco use Assessment & Plan: Smoking history and interested in nicotine patches. No recent annual low-dose CT scan for lung cancer screening despite smoking history. Discussed potential use of nicotine patches for smoking cessation. - Consider initiating annual low-dose CT scan for lung cancer screening in the future   Elevated coronary artery calcium score Assessment & Plan: >90 % . Patient provided with cardiology number to make sure that he calls to reschedule appointment.  Discussed results and risks at continuing with current lifestyle.   Follow-up Missed previous cardiology appointment. Plans to reschedule and follow up with cardiologist. Short-term follow-up with primary care planned to address multiple health issues. - Instruct him to reschedule cardiology appointment - Schedule follow-up appointment in four weeks with primary care     Return in about 4 weeks (around 02/23/2024) for Chronic Followup.   Teryl Lucy Clarence Cogswell, PA-C

## 2024-01-26 NOTE — Assessment & Plan Note (Signed)
>  90 % . Patient provided with cardiology number to make sure that he calls to reschedule appointment.  Discussed results and risks at continuing with current lifestyle.

## 2024-01-27 LAB — COMPREHENSIVE METABOLIC PANEL WITH GFR
ALT: 14 IU/L (ref 0–44)
AST: 21 IU/L (ref 0–40)
Albumin: 4.3 g/dL (ref 3.9–4.9)
Alkaline Phosphatase: 83 IU/L (ref 44–121)
BUN/Creatinine Ratio: 13 (ref 10–24)
BUN: 11 mg/dL (ref 8–27)
Bilirubin Total: 0.4 mg/dL (ref 0.0–1.2)
CO2: 25 mmol/L (ref 20–29)
Calcium: 9.3 mg/dL (ref 8.6–10.2)
Chloride: 102 mmol/L (ref 96–106)
Creatinine, Ser: 0.88 mg/dL (ref 0.76–1.27)
Globulin, Total: 2.3 g/dL (ref 1.5–4.5)
Glucose: 78 mg/dL (ref 70–99)
Potassium: 4.8 mmol/L (ref 3.5–5.2)
Sodium: 140 mmol/L (ref 134–144)
Total Protein: 6.6 g/dL (ref 6.0–8.5)
eGFR: 93 mL/min/{1.73_m2} (ref >59)

## 2024-01-27 LAB — CBC WITH DIFFERENTIAL/PLATELET
Basophils Absolute: 0 10*3/uL (ref 0.0–0.2)
Basos: 0 %
EOS (ABSOLUTE): 0.1 10*3/uL (ref 0.0–0.4)
Eos: 2 %
Hematocrit: 43.7 % (ref 37.5–51.0)
Hemoglobin: 14.6 g/dL (ref 13.0–17.7)
Immature Grans (Abs): 0 10*3/uL (ref 0.0–0.1)
Immature Granulocytes: 0 %
Lymphocytes Absolute: 1.1 10*3/uL (ref 0.7–3.1)
Lymphs: 17 %
MCH: 31.7 pg (ref 26.6–33.0)
MCHC: 33.4 g/dL (ref 31.5–35.7)
MCV: 95 fL (ref 79–97)
Monocytes Absolute: 0.5 10*3/uL (ref 0.1–0.9)
Monocytes: 8 %
Neutrophils Absolute: 4.8 10*3/uL (ref 1.4–7.0)
Neutrophils: 73 %
Platelets: 203 10*3/uL (ref 150–450)
RBC: 4.6 x10E6/uL (ref 4.14–5.80)
RDW: 11.7 % (ref 11.6–15.4)
WBC: 6.6 10*3/uL (ref 3.4–10.8)

## 2024-01-27 LAB — HEMOGLOBIN A1C
Est. average glucose Bld gHb Est-mCnc: 117 mg/dL
Hgb A1c MFr Bld: 5.7 % — ABNORMAL HIGH (ref 4.8–5.6)

## 2024-01-27 LAB — LIPID PANEL
Chol/HDL Ratio: 4.3 ratio (ref 0.0–5.0)
Cholesterol, Total: 181 mg/dL (ref 100–199)
HDL: 42 mg/dL (ref >39)
LDL Chol Calc (NIH): 124 mg/dL — ABNORMAL HIGH (ref 0–99)
Triglycerides: 79 mg/dL (ref 0–149)
VLDL Cholesterol Cal: 15 mg/dL (ref 5–40)

## 2024-01-28 DIAGNOSIS — H2512 Age-related nuclear cataract, left eye: Secondary | ICD-10-CM | POA: Diagnosis not present

## 2024-01-28 DIAGNOSIS — H52209 Unspecified astigmatism, unspecified eye: Secondary | ICD-10-CM | POA: Diagnosis not present

## 2024-01-29 ENCOUNTER — Encounter (HOSPITAL_BASED_OUTPATIENT_CLINIC_OR_DEPARTMENT_OTHER): Payer: Self-pay | Admitting: Student

## 2024-01-29 DIAGNOSIS — H25041 Posterior subcapsular polar age-related cataract, right eye: Secondary | ICD-10-CM | POA: Diagnosis not present

## 2024-01-29 DIAGNOSIS — H25011 Cortical age-related cataract, right eye: Secondary | ICD-10-CM | POA: Diagnosis not present

## 2024-01-29 DIAGNOSIS — H2511 Age-related nuclear cataract, right eye: Secondary | ICD-10-CM | POA: Diagnosis not present

## 2024-02-13 DIAGNOSIS — J441 Chronic obstructive pulmonary disease with (acute) exacerbation: Secondary | ICD-10-CM | POA: Diagnosis not present

## 2024-02-16 ENCOUNTER — Other Ambulatory Visit (HOSPITAL_BASED_OUTPATIENT_CLINIC_OR_DEPARTMENT_OTHER): Payer: Self-pay | Admitting: Student

## 2024-02-16 NOTE — Telephone Encounter (Signed)
 Copied from CRM [id number]. Topic: Clinical - Medication Refill >> Feb 16, 2024  9:44 AM Baldomero Bone wrote: Most Recent Primary Care Visit:  Provider: ROTHFUSS, JACOB T  Department: [facility] CARE  Visit Type: TRANSFER OF CARE  Date: 01/26/2024  Medication: gabapentin  (NEURONTIN ) 300 MG capsule  Has the patient contacted their pharmacy? No (Agent: If no, request that the patient contact the pharmacy for the refill. If patient does not wish to contact the pharmacy document the reason why and proceed with request.) (Agent: If yes, when and what did the pharmacy advise?)  Is this the correct pharmacy for this prescription? Yes If no, delete pharmacy and type the correct one.  This is the patient's preferred pharmacy:  [facility] [address], [place] - [address] [place] [address] Phone: [phone] Fax: [phone]     Has the prescription been filled recently? No  Is the patient out of the medication? Yes  Has the patient been seen for an appointment in the last year OR does the patient have an upcoming appointment? Yes  Can we respond through MyChart? Yes  Agent: Please be advised that Rx refills may take up to 3 business days. We ask that you follow-up with your pharmacy.

## 2024-02-18 DIAGNOSIS — H2511 Age-related nuclear cataract, right eye: Secondary | ICD-10-CM | POA: Diagnosis not present

## 2024-02-18 DIAGNOSIS — H25011 Cortical age-related cataract, right eye: Secondary | ICD-10-CM | POA: Diagnosis not present

## 2024-02-18 DIAGNOSIS — H52209 Unspecified astigmatism, unspecified eye: Secondary | ICD-10-CM | POA: Diagnosis not present

## 2024-02-18 DIAGNOSIS — H25041 Posterior subcapsular polar age-related cataract, right eye: Secondary | ICD-10-CM | POA: Diagnosis not present

## 2024-02-18 MED ORDER — GABAPENTIN 300 MG PO CAPS: ORAL_CAPSULE | ORAL | 2 refills | Status: DC

## 2024-02-23 ENCOUNTER — Ambulatory Visit (INDEPENDENT_AMBULATORY_CARE_PROVIDER_SITE_OTHER): Admitting: Student

## 2024-02-23 ENCOUNTER — Encounter (HOSPITAL_BASED_OUTPATIENT_CLINIC_OR_DEPARTMENT_OTHER): Payer: Self-pay | Admitting: Student

## 2024-02-23 ENCOUNTER — Ambulatory Visit (HOSPITAL_BASED_OUTPATIENT_CLINIC_OR_DEPARTMENT_OTHER): Admitting: Student

## 2024-02-23 ENCOUNTER — Other Ambulatory Visit (HOSPITAL_BASED_OUTPATIENT_CLINIC_OR_DEPARTMENT_OTHER): Payer: Self-pay

## 2024-02-23 VITALS — BP 139/85 | HR 65 | Temp 97.9°F | Resp 16 | Ht 68.0 in | Wt 178.1 lb

## 2024-02-23 DIAGNOSIS — G4700 Insomnia, unspecified: Secondary | ICD-10-CM | POA: Diagnosis not present

## 2024-02-23 DIAGNOSIS — M160 Bilateral primary osteoarthritis of hip: Secondary | ICD-10-CM | POA: Diagnosis not present

## 2024-02-23 DIAGNOSIS — Z720 Tobacco use: Secondary | ICD-10-CM | POA: Diagnosis not present

## 2024-02-23 DIAGNOSIS — Z91199 Patient's noncompliance with other medical treatment and regimen due to unspecified reason: Secondary | ICD-10-CM

## 2024-02-23 DIAGNOSIS — G8929 Other chronic pain: Secondary | ICD-10-CM | POA: Diagnosis not present

## 2024-02-23 DIAGNOSIS — M47816 Spondylosis without myelopathy or radiculopathy, lumbar region: Secondary | ICD-10-CM | POA: Diagnosis not present

## 2024-02-23 DIAGNOSIS — I10 Essential (primary) hypertension: Secondary | ICD-10-CM

## 2024-02-23 DIAGNOSIS — M545 Low back pain, unspecified: Secondary | ICD-10-CM | POA: Diagnosis not present

## 2024-02-23 DIAGNOSIS — J441 Chronic obstructive pulmonary disease with (acute) exacerbation: Secondary | ICD-10-CM | POA: Diagnosis not present

## 2024-02-23 DIAGNOSIS — F418 Other specified anxiety disorders: Secondary | ICD-10-CM

## 2024-02-23 DIAGNOSIS — E785 Hyperlipidemia, unspecified: Secondary | ICD-10-CM

## 2024-02-23 MED ORDER — DOXEPIN HCL 10 MG PO CAPS
10.0000 mg | ORAL_CAPSULE | Freq: Every evening | ORAL | 5 refills | Status: DC | PRN
Start: 2024-02-23 — End: 2024-03-17

## 2024-02-23 MED ORDER — GABAPENTIN 300 MG PO CAPS: ORAL_CAPSULE | ORAL | 1 refills | Status: DC

## 2024-02-23 MED ORDER — NICOTINE 21 MG/24HR TD PT24: 21.0000 mg | MEDICATED_PATCH | Freq: Every day | TRANSDERMAL | 6 refills | Status: DC

## 2024-02-23 NOTE — Progress Notes (Addendum)
 Established Patient Office Visit  Subjective   Patient ID: Justin [name], male    DOB: [birth year]  Age: 68 y.o. MRN: [id number]  Chief Complaint  Patient presents with   Medical Management of Chronic Issues    Here for follow up. Would like gabapentin  increased and prefers 90 day supply. Pain specialist would not give him rx for valium  for insomnia. Trazodone  does not help.     HPI  Discussed the use of AI scribe software for clinical note transcription with the patient, who gave verbal consent to proceed.  History of Present Illness   Justin [name] is a 68 year old male with chronic pain and coronary artery disease who presents for a follow-up visit.  He manages his chronic pain with gabapentin , taking four 300 mg capsules per day, two in the morning and two in the evening, which helps alleviate his pain. He also uses hydrocodone  and lidocaine  patches prescribed by his pain doctor. He has a history of low back pain with metal implants and arthritis, but experiences pain primarily in his hip without radiation down his legs.  He smokes a pack of cigarettes per day and is considering nicotine  patches to help quit, as encouraged by his girlfriend. He has previously smoked up to two packs per day but reduces smoking when working a lot. He uses a Breztri  inhaler for respiratory issues related to smoking, which he finds effective in opening his airways. No recent swelling in his feet, though it occurs when he is on ladders for extended periods.  He is following with cardiology for coronary artery disease, which requires monitoring. He takes Crestor (rosuvastatin) daily for cholesterol management.  He experiences knee pain, which can be severe enough to impair walking, but it resolves when he is not on his feet. He has had knee injections in the past.  He struggles with insomnia, having tried trazodone  and Valium  in the past, but dislikes the effects of trazodone . He experiences racing  thoughts at night related to planning his work, which he attributes to anxiety. He has tried over-the-counter remedies without success.     Patient has a history of anxiety and depression.  ROS Per HPI.    Objective:     BP 139/85   Pulse 65   Temp 97.9 F (36.6 C) (Oral)   Resp 16   Ht 5\' 8"  (1.727 m)   Wt 178 lb 1.6 oz (80.8 kg)   SpO2 96%   BMI 27.08 kg/m    Physical Exam Constitutional:      General: He is not in acute distress.    Appearance: Normal appearance. He is not ill-appearing.  HENT:     Head: Normocephalic and atraumatic.     Right Ear: External ear normal.     Left Ear: External ear normal.     Nose: Nose normal.  Eyes:     Conjunctiva/sclera: Conjunctivae normal.  Cardiovascular:     Rate and Rhythm: Normal rate and regular rhythm.     Pulses: Normal pulses.     Heart sounds: Normal heart sounds. No murmur heard.    No friction rub.  Pulmonary:     Effort: Pulmonary effort is normal. No respiratory distress.     Breath sounds: Rhonchi (diffuse) present. No wheezing or rales.  Skin:    General: Skin is warm and dry.     Coloration: Skin is not jaundiced or pale.  Neurological:     Mental Status: He is alert.  Psychiatric:        Mood and Affect: Mood normal.        Behavior: Behavior normal.      No results found for any visits on 02/23/24.    The 10-year ASCVD risk score (Arnett DK, et al., 2019) is: 28%    Assessment & Plan:   Chronic left-sided low back pain without sciatica Assessment & Plan: Chronic low back pain with arthritis, managed with gabapentin  and hydrocodone . Pain management is well-controlled. No radicular symptoms. Gabapentin  alleviates nerve pain. - Prescribe 90-day supply of gabapentin  (300 mg) for pain management.  Orders: -     Gabapentin ; TAKE 2 CAPSULES (600 MG TOTAL) BY MOUTH 2 (TWO) TIMES DAILY.  Dispense: 360 capsule; Refill: 1  Tobacco use Assessment & Plan: Smokes one pack per day. Girlfriend supports  cessation. Discussed nicotine  patches (21 mg) to aid quitting. He expressed need for incentive to quit. - Prescribe 21 mg nicotine  patches to aid in smoking cessation.  Orders: -     Nicotine ; Place 1 patch (21 mg total) onto the skin daily.  Dispense: 28 patch; Refill: 6  Essential hypertension Assessment & Plan: Blood pressure is 139/90 mmHg, not at goal of < 130/80 mmHg. Advised to reduce salt intake to manage blood pressure. - Advise reducing salt intake to manage blood pressure.   COPD, frequent exacerbations (HCC) Assessment & Plan: Stable on inhaler aside form compliance issue. Continues to smoke, exacerbating COPD symptoms. Breztri  inhaler effectively opens airways. - Ensure use of Breztri  inhaler as needed.   INSOMNIA, CHRONIC Assessment & Plan: Difficulty sleeping due to racing thoughts and anxiety. Previous trials of trazodone  and OTC medications were ineffective. Discussed trial of low-dose doxepin . He expressed doubt but is willing to try. - Prescribe low-dose doxepin  for insomnia, 30-day supply.   Orders: -     Doxepin  HCl; Take 1 capsule (10 mg total) by mouth at bedtime as needed. FOR SLEEP.  Dispense: 30 capsule; Refill: 5  Noncompliance Assessment & Plan: Not taking prescribed medications consistently, including statins and inhalers. Expressed willingness to improve compliance. Discussed risks of non-compliance, including increased risk for cardiac events. - Encourage use of pill organizer and setting alarms for medication reminders - Consider bubble pack medication packaging if compliance does not improve - Refer to [facility]  Orders: -     AMB Referral [facility] Care Management  Dyslipidemia Assessment & Plan: Stable. On rosuvastatin (Crestor) for cholesterol management. Emphasized daily adherence to prevent cardiovascular events. Discussed compliance packaging to improve adherence. - Continue current regimen. - Ensure daily adherence to rosuvastatin (Crestor). -  Consider compliance packaging to improve adherence.   Depression with anxiety -     Doxepin  HCl; Take 1 capsule (10 mg total) by mouth at bedtime as needed. FOR SLEEP.  Dispense: 30 capsule; Refill: 5  Return in about 3 months (around 05/25/2024) for Chronic Followup.    Chayse Zatarain T Norberto Wishon, PA-C

## 2024-02-23 NOTE — Patient Instructions (Addendum)
 It was nice to see you today!  As we discussed in clinic:  Please make sure you are taking your Crestor for cholesterol.  I will try and get you set up for compliance packaging on your medications.  If you have any problems before your next visit feel free to message me via MyChart (minor issues or questions) or call the office, otherwise you may reach out to schedule an office visit.  Thank you! Tranice Laduke, PA-C

## 2024-02-23 NOTE — Assessment & Plan Note (Signed)
 Smokes one pack per day. Girlfriend supports cessation. Discussed nicotine  patches (21 mg) to aid quitting. He expressed need for incentive to quit. - Prescribe 21 mg nicotine  patches to aid in smoking cessation.

## 2024-02-23 NOTE — Assessment & Plan Note (Signed)
 Chronic low back pain with arthritis, managed with gabapentin  and hydrocodone . Pain management is well-controlled. No radicular symptoms. Gabapentin  alleviates nerve pain. - Prescribe 90-day supply of gabapentin  (300 mg) for pain management.

## 2024-02-23 NOTE — Assessment & Plan Note (Addendum)
 Stable on inhaler aside form compliance issue. Continues to smoke, exacerbating COPD symptoms. Breztri  inhaler effectively opens airways. - Ensure use of Breztri  inhaler as needed.

## 2024-02-23 NOTE — Assessment & Plan Note (Signed)
 Difficulty sleeping due to racing thoughts and anxiety. Previous trials of trazodone  and OTC medications were ineffective. Discussed trial of low-dose doxepin. He expressed doubt but is willing to try. - Prescribe low-dose doxepin for insomnia, 30-day supply.

## 2024-02-23 NOTE — Assessment & Plan Note (Addendum)
 Stable. On rosuvastatin (Crestor) for cholesterol management. Emphasized daily adherence to prevent cardiovascular events. Discussed compliance packaging to improve adherence. - Continue current regimen. - Ensure daily adherence to rosuvastatin (Crestor). - Consider compliance packaging to improve adherence.

## 2024-02-23 NOTE — Assessment & Plan Note (Addendum)
 Not taking prescribed medications consistently, including statins and inhalers. Expressed willingness to improve compliance. Discussed risks of non-compliance, including increased risk for cardiac events. - Encourage use of pill organizer and setting alarms for medication reminders - Consider bubble pack medication packaging if compliance does not improve - Refer to [facility]

## 2024-02-23 NOTE — Assessment & Plan Note (Addendum)
 Blood pressure is 139/90 mmHg, not at goal of < 130/80 mmHg. Advised to reduce salt intake to manage blood pressure. - Advise reducing salt intake to manage blood pressure.

## 2024-02-24 ENCOUNTER — Telehealth: Payer: Self-pay

## 2024-02-24 NOTE — Progress Notes (Signed)
 Complex Care Management Care Guide Note  02/24/2024 Name: Justin [name] MRN: [id number] DOB: 13-Jul-1956  Justin [name] is a 67 y.o. year old male who is a primary care patient of Rothfuss, Jacob T, PA-C and is actively engaged with the care management team. I reached out to Judy Null by phone today to assist with scheduling  with the Pharmacist.  Follow up plan: Telephone appointment will be made by Rolando Cliche, [facility].  [facility]  [facility], [facility] Health Care Management Assistant Direct Dial: [phone]  Fax: [phone]

## 2024-02-24 NOTE — Progress Notes (Signed)
 Complex Care Management Note Care Guide Note  02/24/2024 Name: Justin [name] MRN: [id number] DOB: [birth year]   Complex Care Management Outreach Attempts: An unsuccessful telephone outreach was attempted today to offer the patient information about available complex care management services.  Follow Up Plan:  Additional outreach attempts will be made to offer the patient complex care management information and services.   Encounter Outcome:  No Answer  [facility]  [facility], [facility] Health Care Management Assistant Direct Dial: [phone]  Fax: [phone]

## 2024-03-01 ENCOUNTER — Telehealth (HOSPITAL_BASED_OUTPATIENT_CLINIC_OR_DEPARTMENT_OTHER): Payer: Self-pay

## 2024-03-01 NOTE — Telephone Encounter (Signed)
 Spoke to pt regarding appeal for doxepin . Said he paid cash. Does not want to talk about how much it was. Would not like to continue taking it due to causing drowsiness. Arnetta Lank he has tried everything for his insomnia and the only thing that helped that did not cause drowsiness was valium .

## 2024-03-07 ENCOUNTER — Telehealth: Payer: Self-pay

## 2024-03-07 NOTE — Progress Notes (Signed)
   03/07/2024  Patient ID: Justin [name], male   DOB: 1956-08-02, 68 y.o.   MRN: [id number]  Contacted patient regarding referral for medication management from Rothfuss, Laron Plummer, PA-C  Left patient a voicemail to return my call at their convenience. Should patient return call I can be reached at the number below, otherwise will try once more next week.   Rolando Cliche, PharmD, BCGP Clinical Pharmacist  [phone]   Future Appointments  Date Time Provider Department Center  05/25/2024  8:50 AM Rothfuss, Alexia Angelucci [facility] None

## 2024-03-14 DIAGNOSIS — J441 Chronic obstructive pulmonary disease with (acute) exacerbation: Secondary | ICD-10-CM | POA: Diagnosis not present

## 2024-03-17 ENCOUNTER — Other Ambulatory Visit (HOSPITAL_BASED_OUTPATIENT_CLINIC_OR_DEPARTMENT_OTHER): Payer: Self-pay | Admitting: Student

## 2024-03-17 DIAGNOSIS — F418 Other specified anxiety disorders: Secondary | ICD-10-CM

## 2024-03-17 DIAGNOSIS — G4700 Insomnia, unspecified: Secondary | ICD-10-CM

## 2024-03-25 ENCOUNTER — Other Ambulatory Visit (HOSPITAL_BASED_OUTPATIENT_CLINIC_OR_DEPARTMENT_OTHER): Payer: Self-pay

## 2024-03-25 ENCOUNTER — Telehealth (HOSPITAL_BASED_OUTPATIENT_CLINIC_OR_DEPARTMENT_OTHER): Payer: Self-pay

## 2024-03-25 DIAGNOSIS — F418 Other specified anxiety disorders: Secondary | ICD-10-CM

## 2024-03-25 DIAGNOSIS — G4700 Insomnia, unspecified: Secondary | ICD-10-CM

## 2024-03-25 MED ORDER — DOXEPIN HCL 10 MG PO CAPS: 10.0000 mg | ORAL_CAPSULE | Freq: Every evening | ORAL | 0 refills | Status: DC | PRN

## 2024-03-25 NOTE — Telephone Encounter (Signed)
 Pt advised Doxepin  is approved 10/21/2023-10/19/2024. He is in [place], [place] and would like [facility]. Rx sent.

## 2024-03-30 DIAGNOSIS — Z79891 Long term (current) use of opiate analgesic: Secondary | ICD-10-CM | POA: Diagnosis not present

## 2024-03-30 DIAGNOSIS — M1611 Unilateral primary osteoarthritis, right hip: Secondary | ICD-10-CM | POA: Diagnosis not present

## 2024-04-14 DIAGNOSIS — J441 Chronic obstructive pulmonary disease with (acute) exacerbation: Secondary | ICD-10-CM | POA: Diagnosis not present

## 2024-04-15 ENCOUNTER — Other Ambulatory Visit (HOSPITAL_BASED_OUTPATIENT_CLINIC_OR_DEPARTMENT_OTHER): Payer: Self-pay

## 2024-04-15 ENCOUNTER — Telehealth (HOSPITAL_BASED_OUTPATIENT_CLINIC_OR_DEPARTMENT_OTHER): Payer: Self-pay

## 2024-04-15 DIAGNOSIS — J441 Chronic obstructive pulmonary disease with (acute) exacerbation: Secondary | ICD-10-CM

## 2024-04-15 DIAGNOSIS — G4700 Insomnia, unspecified: Secondary | ICD-10-CM

## 2024-04-15 DIAGNOSIS — F418 Other specified anxiety disorders: Secondary | ICD-10-CM

## 2024-04-15 MED ORDER — DOXEPIN HCL 10 MG PO CAPS: 10.0000 mg | ORAL_CAPSULE | Freq: Every evening | ORAL | 0 refills | Status: DC | PRN

## 2024-04-15 MED ORDER — ALBUTEROL SULFATE HFA 108 (90 BASE) MCG/ACT IN AERS: 2.0000 | INHALATION_SPRAY | RESPIRATORY_TRACT | 1 refills | Status: DC | PRN

## 2024-04-15 MED ORDER — TRELEGY ELLIPTA 200-62.5-25 MCG/ACT IN AEPB: 1.0000 | INHALATION_SPRAY | Freq: Every day | RESPIRATORY_TRACT | 0 refills | Status: DC

## 2024-04-15 NOTE — Telephone Encounter (Signed)
 Patient came in requesting a referral for a lung specialist. Patient stated he is having a hard time breathing at night.  Patient is requesting an inhaler and Trelegy. Please advise.

## 2024-04-15 NOTE — Telephone Encounter (Signed)
 Spoke to pt concerning request for albuterol  inhaler & trelegy rx. Per PCP, he should be on breztri  not trelegy. Pt said he had both and he was using the trelegy 200mg  but just ran out. He had SOB last night and did not want to go to ER. Spoke to PCP, he should go to ER over weekend if he is having any SOB. Appt made for 04/18/2024 to follow up. Medlist updated to albuterol  and trelegy now. Pt has been informed and has voiced understanding.

## 2024-04-18 ENCOUNTER — Encounter (HOSPITAL_BASED_OUTPATIENT_CLINIC_OR_DEPARTMENT_OTHER): Payer: Self-pay | Admitting: Student

## 2024-04-18 ENCOUNTER — Ambulatory Visit (HOSPITAL_BASED_OUTPATIENT_CLINIC_OR_DEPARTMENT_OTHER): Admitting: Student

## 2024-04-18 VITALS — BP 130/82 | HR 65 | Temp 98.2°F | Resp 16 | Ht 68.0 in | Wt 185.2 lb

## 2024-04-18 DIAGNOSIS — Z720 Tobacco use: Secondary | ICD-10-CM

## 2024-04-18 DIAGNOSIS — N529 Male erectile dysfunction, unspecified: Secondary | ICD-10-CM

## 2024-04-18 DIAGNOSIS — J302 Other seasonal allergic rhinitis: Secondary | ICD-10-CM

## 2024-04-18 DIAGNOSIS — K50919 Crohn's disease, unspecified, with unspecified complications: Secondary | ICD-10-CM

## 2024-04-18 DIAGNOSIS — F1190 Opioid use, unspecified, uncomplicated: Secondary | ICD-10-CM | POA: Diagnosis not present

## 2024-04-18 DIAGNOSIS — J441 Chronic obstructive pulmonary disease with (acute) exacerbation: Secondary | ICD-10-CM | POA: Diagnosis not present

## 2024-04-18 DIAGNOSIS — Z91199 Patient's noncompliance with other medical treatment and regimen due to unspecified reason: Secondary | ICD-10-CM

## 2024-04-18 DIAGNOSIS — G8929 Other chronic pain: Secondary | ICD-10-CM

## 2024-04-18 DIAGNOSIS — M545 Low back pain, unspecified: Secondary | ICD-10-CM | POA: Diagnosis not present

## 2024-04-18 DIAGNOSIS — J9611 Chronic respiratory failure with hypoxia: Secondary | ICD-10-CM

## 2024-04-18 HISTORY — DX: Crohn's disease, unspecified, with unspecified complications: K50.919

## 2024-04-18 HISTORY — DX: Male erectile dysfunction, unspecified: N52.9

## 2024-04-18 HISTORY — DX: Other seasonal allergic rhinitis: J30.2

## 2024-04-18 MED ORDER — IPRATROPIUM-ALBUTEROL 0.5-2.5 (3) MG/3ML IN SOLN
3.0000 mL | Freq: Two times a day (BID) | RESPIRATORY_TRACT | 1 refills | Status: DC
Start: 2024-04-18 — End: 2024-05-20

## 2024-04-18 MED ORDER — FLUTICASONE PROPIONATE 50 MCG/ACT NA SUSP: 2.0000 | Freq: Every day | NASAL | 3 refills | Status: DC

## 2024-04-18 MED ORDER — AZITHROMYCIN 250 MG PO TABS: ORAL_TABLET | ORAL | 0 refills | Status: AC

## 2024-04-18 MED ORDER — NICOTINE 21 MG/24HR TD PT24: 21.0000 mg | MEDICATED_PATCH | Freq: Every day | TRANSDERMAL | 0 refills | Status: DC

## 2024-04-18 MED ORDER — IPRATROPIUM-ALBUTEROL 0.5-2.5 (3) MG/3ML IN SOLN
3.0000 mL | Freq: Once | RESPIRATORY_TRACT | Status: AC
  Administered 2024-04-18: 3 mL via RESPIRATORY_TRACT

## 2024-04-18 MED ORDER — PREDNISONE 20 MG PO TABS: ORAL_TABLET | ORAL | 0 refills | Status: DC

## 2024-04-18 MED ORDER — GABAPENTIN 300 MG PO CAPS: ORAL_CAPSULE | ORAL | 1 refills | Status: DC

## 2024-04-18 MED ORDER — TADALAFIL 5 MG PO TABS: 5.0000 mg | ORAL_TABLET | Freq: Every day | ORAL | 1 refills | Status: DC | PRN

## 2024-04-18 NOTE — Progress Notes (Signed)
 Acute Office Visit  Subjective:     Patient ID: Justin [name], male    DOB: 06/01/1956, 68 y.o.   MRN: [id number]  Chief Complaint  Patient presents with   Shortness of Breath    Had SOB last week and needed an inhaler. Called last week to request trelegy and albuterol  refill. Trelegy was $175. It really helps though. [facility]. Is smoking 1ppd.    Medication Refill    Need refill on gabapentin , flonase , and cialis .    Rib Injury    Rib cartilage on right side keeps popping. Started years ago. Used to race motorcycles.     HPI  Discussed the use of AI scribe software for clinical note transcription with the patient, who gave verbal consent to proceed.  History of Present Illness   Justin [name] is a 68 year old male with COPD who presents with shortness of breath.  He experiences shortness of breath when lying down, which began over the weekend. The sensation is described as 'gasping for air' and occurs approximately fifteen seconds after lying down, accompanied by coughing. Relief is achieved by getting up, and symptoms gradually subside. After starting Trelegy, symptoms improved. He had not been taking Trelegy prior to this episode due to lack of availability. He uses oxygen  at home and has a nebulizer available.  He coughs up a small amount of yellowish phlegm, which has not changed color or darkened. He recalls a previous episode about three-quarters of a year ago with similar breathing issues, leading to a four-day hospitalization. During that episode, he experienced temporary vision loss while driving, described as his eyesight going 'totally black' and gradually returning.  He has a history of COPD and uses inhalers, including Breztri  and Trelegy, to manage symptoms. He is currently taking Crestor 10 mg daily for cholesterol management, Protonix  for acid reflux, and gabapentin  for pain management. He is also on doxepin  for sleep, which he  reports is helping 'a little bit'.  He has a history of Crohn's disease and colon cancer, for which he underwent radiation treatment. He reports inconsistent bowel movements since the treatment and is unsure of the specific medications he is currently taking for Crohn's disease.  He mentions a family history of diverticulitis, as his sister suffers from the condition. He is trying to quit smoking and requests nicotine  patches to aid in cessation.  He recalls a CT scan done in February, intended for a chest issue but incorrectly performed on the lower abdomen. He mentions a history of a large hiatal hernia, which was evaluated and deemed small, with plans for future monitoring. He is currently taking Protonix  to manage acid reflux symptoms.      ROS Per HPI     Objective:    BP 130/82   Pulse 65   Temp 98.2 F (36.8 C) (Oral)   Resp 16   Ht 5' 8 (1.727 m)   Wt 185 lb 3.2 oz (84 kg)   SpO2 96%   BMI 28.16 kg/m  BP Readings from Last 3 Encounters:  04/18/24 130/82  02/23/24 139/85  01/26/24 (!) 153/92   Wt Readings from Last 3 Encounters:  04/18/24 185 lb 3.2 oz (84 kg)  02/23/24 178 lb 1.6 oz (80.8 kg)  01/26/24 182 lb 6.4 oz (82.7 kg)   SpO2 Readings from Last 3 Encounters:  04/18/24 96%  02/23/24 96%  01/26/24 94%      Physical Exam Constitutional:  General: He is not in acute distress.    Appearance: Normal appearance. He is not ill-appearing.  HENT:     Head: Normocephalic and atraumatic.     Right Ear: External ear normal.     Left Ear: External ear normal.     Nose: Nose normal.   Eyes:     Conjunctiva/sclera: Conjunctivae normal.    Cardiovascular:     Rate and Rhythm: Normal rate and regular rhythm.     Pulses: Normal pulses.     Heart sounds: Normal heart sounds. No murmur heard.    No friction rub.  Pulmonary:     Effort: Pulmonary effort is normal. No respiratory distress.     Breath sounds: Examination of the right-lower field reveals  rhonchi and rales. Examination of the left-lower field reveals rhonchi and rales. Rhonchi and rales present. No wheezing.   Musculoskeletal:     Right lower leg: No edema.     Left lower leg: No edema.   Skin:    General: Skin is warm and dry.     Coloration: Skin is not jaundiced or pale.   Neurological:     Mental Status: He is alert.   Psychiatric:        Mood and Affect: Mood normal.        Behavior: Behavior normal.     No results found for any visits on 04/18/24.      Assessment & Plan:   Assessment and Plan    Chronic Obstructive Pulmonary Disease (COPD) exacerbation (recurrent)/Chronic Hypoxemic Respiratory Failure Chronic with acute exacerbation. He experienced shortness of breath and coughing over the weekend, which improved with Trelegy. He had not been taking Trelegy due to lack of supply. The exacerbation was characterized by gasping for air and coughing up yellowish phlegm. He uses a nebulizer and oxygen  at home (chronic hypoxemia). Referral to a pulmonologist is recommended for further management as the condition is beyond basic COPD care. A previous coronary angiogram showed clear lungs, but a CT may be needed for further evaluation, which will be left to pulmonology to decide. He is advised to go to the ER if experiencing severe symptoms such as shortness of breath, chest pain, or vision changes. - Prescribe a steroid pack (prednisone ) with a tapering dose over six days. - Prescribe a Z-Pak (azithromycin) for antibiotic coverage. - Refer to a pulmonologist in [place] for further management. - Administer a nebulizer treatment in the office.  Hiatal Hernia Chronic, stable. He has a large hiatal hernia, evaluated by a GI specialist. He is taking Protonix  daily to manage reflux symptoms, which can place patient at risk for aspiration pneumonia and worsen breathing if not controlled. - Continue Protonix  daily to manage reflux symptoms.  Crohn's Disease He follows  up with a GI doctor in [place]. He is unsure of the specific medications he is on for Crohn's disease. He also has a history of colon cancer treatment, which has affected his bowel movements. - Continue follow-up with GI doctor in [place].  Nicotine  Dependence Chronic, not at goal. He is trying to quit smoking and requested nicotine  patches. A tapering plan for nicotine  patches was discussed to aid in smoking cessation. He was informed about the option to obtain patches for free but opted to proceed with the prescription immediately. - Prescribe four weeks of 21 mg nicotine  patches, followed by four weeks of 14 mg patches. - Provide a handout on where to place nicotine  patches and advise rotating placement to avoid  skin irritation.  Hyperlipidemia Chronic, stable. He is on Crestor 10 mg daily and is compliant with the medication. - Continue Crestor 10 mg daily.  Seasonal allergies - Refill flonase   Insomnia - Chronic, stable. Continue doxepin  for sleep.   Chronic back pain - Chronic, refill gabapentin .    Return if symptoms worsen or fail to improve.  Illa Enlow T Pao Haffey, PA-C

## 2024-04-18 NOTE — Patient Instructions (Addendum)
   Please schedule a follow up appointment with the cardiologist!  Please call the pharmacist below for help with your medications. You missed a call from him on 03/07/24. Lang Sieve, PharmD, BCGP Clinical Pharmacist  [phone]

## 2024-04-19 ENCOUNTER — Telehealth (HOSPITAL_BASED_OUTPATIENT_CLINIC_OR_DEPARTMENT_OTHER): Payer: Self-pay

## 2024-04-19 NOTE — Telephone Encounter (Signed)
 PA SUMITTED FOR TADALAFIL  5MG  TJ, CMA 04/19/2024

## 2024-04-21 ENCOUNTER — Encounter (HOSPITAL_BASED_OUTPATIENT_CLINIC_OR_DEPARTMENT_OTHER): Payer: Self-pay

## 2024-04-21 ENCOUNTER — Other Ambulatory Visit (HOSPITAL_BASED_OUTPATIENT_CLINIC_OR_DEPARTMENT_OTHER): Payer: Self-pay

## 2024-04-25 ENCOUNTER — Other Ambulatory Visit (HOSPITAL_BASED_OUTPATIENT_CLINIC_OR_DEPARTMENT_OTHER): Payer: Self-pay | Admitting: Student

## 2024-04-25 DIAGNOSIS — G8929 Other chronic pain: Secondary | ICD-10-CM

## 2024-04-25 NOTE — Telephone Encounter (Unsigned)
 Copied from CRM [id number]. Topic: Clinical - Medication Refill >> Apr 25, 2024  3:53 PM Donee H wrote: Medication: gabapentin  (NEURONTIN ) 300 MG capsule  Has the patient contacted their pharmacy? No (Agent: If no, request that the patient contact the pharmacy for the refill. If patient does not wish to contact the pharmacy document the reason why and proceed with request.) (Agent: If yes, when and what did the pharmacy advise?)  This is the patient's preferred pharmacy:  [facility] [address] [place] - [address] [place] [address] Phone: [phone] Fax: [phone]    Is this the correct pharmacy for this prescription? Yes If no, delete pharmacy and type the correct one.   Has the prescription been filled recently? No  Is the patient out of the medication? Yes, patient stated completely out and would like refill as soon as possible   Has the patient been seen for an appointment in the last year OR does the patient have an upcoming appointment? Yes  Can we respond through MyChart? Yes  Agent: Please be advised that Rx refills may take up to 3 business days. We ask that you follow-up with your pharmacy.

## 2024-04-28 ENCOUNTER — Other Ambulatory Visit: Payer: Self-pay

## 2024-04-28 DIAGNOSIS — Z87442 Personal history of urinary calculi: Secondary | ICD-10-CM | POA: Insufficient documentation

## 2024-04-28 DIAGNOSIS — E785 Hyperlipidemia, unspecified: Secondary | ICD-10-CM | POA: Insufficient documentation

## 2024-04-28 DIAGNOSIS — G4700 Insomnia, unspecified: Secondary | ICD-10-CM | POA: Insufficient documentation

## 2024-04-28 DIAGNOSIS — I2510 Atherosclerotic heart disease of native coronary artery without angina pectoris: Secondary | ICD-10-CM | POA: Insufficient documentation

## 2024-04-28 NOTE — Progress Notes (Signed)
 Cardiology Office Note   Date:  04/29/2024  ID:  Justin [name], DOB 28-Apr-1956, MRN [id number] PCP: Justin [name] Justin [name]  [facility] Providers Cardiologist:  Justin Fitch, MD Cardiology APP:  Justin Delon BROCKS, NP     History of Present Illness Justin [name] is a 68 y.o. male with a past medical history of coronary artery disease, hypertension, history of colon cancer, tobacco abuse, COPD.  11/12/2023 echo EF 60-65%, moderate concentric LVH, grade 1 DD, mild MR, mild dilatation of the ascending aorta 39 mm 11/05/2023 calcium score 1410, 91st percentile 12/26/2015 Myoview normal, low risk  He established care with Dr. Fitch in January 2025 for the evaluation of hypertension.  His blood pressure was elevated at 130/72, he was advised to keep a check of his blood pressure to see if an antihypertensive agent need to be started.  He did have some DOE, an echocardiogram was arranged which was overall unrevealing.  He did have a calcium score was elevated 1410, 91st percentile.  He presents today for follow-up after his testing as outlined above.  He has been doing well since he was last evaluated in our office, does not have any formal complaints from a cardiac perspective.  He is bothered by shortness of breath however this is persistent, ongoing feels that is related to his COPD and his PCP has referred him to a pulmonologist at [facility].  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  Spends half of his time here and the other half in [place] where he works. He denies chest pain, palpitations, dyspnea, pnd, orthopnea, n, v, dizziness, syncope, edema, weight gain, or early satiety.    ROS: Review of Systems  Respiratory:  Positive for shortness of breath.   Musculoskeletal:  Positive for joint pain.  All other systems reviewed and are negative.     Studies Reviewed     Cardiac Studies & Procedures   ______________________________________________________________________________________________   STRESS TESTS  MYOCARDIAL PERFUSION IMAGING 01/15/2016  Interpretation Summary  Nuclear stress EF: 62%.  There was no ST segment deviation noted during stress.  The study is normal.  This is a low risk study.  The left ventricular ejection fraction is normal (55-65%).  There was a slight perfusion defect of the inferior wall both at rest and stress. Given the normal systolic function and excellent exercise capacity, this is likely due to diaphragmatic attenuation.   ECHOCARDIOGRAM  ECHOCARDIOGRAM COMPLETE 11/12/2023  Narrative ECHOCARDIOGRAM REPORT    Patient Name:   Justin [name] Date of Exam: 11/12/2023 Medical Rec #:  [id number]         Height:       71.0 in Accession #:    [id number]        Weight:       185.6 lb Date of Birth:  1956/01/12        BSA:          2.043 m Patient Age:    56 years          BP:           130/72 mmHg Patient Gender: M                 HR:           70 bpm. Exam Location:  [place]  Procedure: 2D Echo, Cardiac Doppler, Color Doppler and Strain Analysis  Indications:    DOE (dyspnea on exertion) [R06.09 (ICD-10-CM)]  History:        Patient has no prior history of Echocardiogram examinations. COPD; Risk Factors:Hypertension, Smoking and Dyslipidemia.  Sonographer:    Lynwood Silvas RDCS Referring Phys: [phone] Justin PARAS KRASOWSKI  IMPRESSIONS   1. Left ventricular ejection fraction, by estimation, is 60 to 65%. The left ventricle has normal function. The left ventricle has no regional wall motion abnormalities. There is moderate concentric left ventricular hypertrophy. Left ventricular diastolic parameters are consistent with Grade I diastolic dysfunction (impaired relaxation). The average left ventricular global longitudinal strain is 16.8 %. The global longitudinal strain is abnormal. 2.  Right ventricular systolic function is normal. The right ventricular size is normal. There is normal pulmonary artery systolic pressure. 3. The mitral valve is degenerative. Mild mitral valve regurgitation. No evidence of mitral stenosis. 4. The aortic valve is tricuspid. Aortic valve regurgitation is not visualized. Aortic valve sclerosis is present, with no evidence of aortic valve stenosis. 5. There is mild dilatation of the ascending aorta, measuring 39 mm. 6. The inferior vena cava is dilated in size with >50% respiratory variability, suggesting right atrial pressure of 8 mmHg.  FINDINGS Left Ventricle: Left ventricular ejection fraction, by estimation, is 60 to 65%. The left ventricle has normal function. The left ventricle has no regional wall motion abnormalities. The average left ventricular global longitudinal strain is 16.8 %. The global longitudinal strain is abnormal. The left ventricular internal cavity size was normal in size. There is moderate concentric left ventricular hypertrophy. Left ventricular diastolic parameters are consistent with Grade I diastolic dysfunction (impaired relaxation). Normal left ventricular filling pressure.  Right Ventricle: The right ventricular size is normal. No increase in right ventricular wall thickness. Right ventricular systolic function is normal. There is normal pulmonary artery systolic pressure. The tricuspid regurgitant velocity is 1.68 m/s, and with an assumed right atrial pressure of 8 mmHg, the estimated right ventricular systolic pressure is 19.3 mmHg.  Left Atrium: Left atrial size was normal in size.  Right Atrium: Right atrial size was normal in size.  Pericardium: There is no evidence of pericardial effusion.  Mitral Valve: The mitral valve is degenerative in appearance. Mild mitral annular calcification. Mild mitral valve regurgitation. No evidence of mitral valve stenosis.  Tricuspid Valve: The tricuspid valve is normal in  structure. Tricuspid valve regurgitation is trivial. No evidence of tricuspid stenosis.  Aortic Valve: The aortic valve is tricuspid. Aortic valve regurgitation is not visualized. Aortic valve sclerosis is present, with no evidence of aortic valve stenosis.  Pulmonic Valve: The pulmonic valve was normal in structure. Pulmonic valve regurgitation is mild. No evidence of pulmonic stenosis.  Aorta: The aortic arch was not well visualized and the aortic root is normal in size and structure. There is mild dilatation of the ascending aorta, measuring 39 mm.  Venous: The inferior vena cava is dilated in size with greater than 50% respiratory variability, suggesting right atrial pressure of 8 mmHg.  IAS/Shunts: No atrial level shunt detected by color flow Doppler.   LEFT VENTRICLE PLAX 2D LVIDd:         4.40 cm   Diastology LVIDs:         2.90 cm   LV e' medial:    5.87 cm/s LV PW:         1.40 cm   LV E/e' medial:  7.8 LV IVS:        1.50 cm   LV e' lateral:  13.20 cm/s LVOT diam:     2.30 cm   LV E/e' lateral: 3.5 LV SV:         79 LV SV Index:   39        2D Longitudinal Strain LVOT Area:     4.15 cm  2D Strain GLS Avg:     16.8 %   RIGHT VENTRICLE             IVC RV Basal diam:  3.30 cm     IVC diam: 2.10 cm RV S prime:     19.40 cm/s TAPSE (M-mode): 2.7 cm  LEFT ATRIUM             Index        RIGHT ATRIUM           Index LA diam:        3.30 cm 1.62 cm/m   RA Area:     15.30 cm LA Vol (A2C):   71.5 ml 35.00 ml/m  RA Volume:   40.30 ml  19.73 ml/m LA Vol (A4C):   55.0 ml 26.93 ml/m LA Biplane Vol: 65.2 ml 31.92 ml/m AORTIC VALVE LVOT Vmax:   102.30 cm/s LVOT Vmean:  63.650 cm/s LVOT VTI:    0.190 m  AORTA Ao Root diam: 3.40 cm Ao Asc diam:  3.85 cm Ao Desc diam: 2.90 cm  MV E velocity: 45.60 cm/s  TRICUSPID VALVE MV A velocity: 66.00 cm/s  TR Peak grad:   11.3 mmHg MV E/A ratio:  0.69        TR Vmax:        168.00 cm/s  SHUNTS Systemic VTI:  0.19 m Systemic  Diam: 2.30 cm  Redell Leiter MD Electronically signed by Redell Leiter MD Signature Date/Time: 11/12/2023/5:28:01 PM    Final      CT SCANS  CT CARDIAC SCORING (SELF PAY ONLY) 11/05/2023  Addendum 11/18/2023 12:19 AM ADDENDUM REPORT: 11/18/2023 00:16  EXAM: OVER-READ INTERPRETATION  CT CHEST  The following report is an over-read performed by radiologist Dr. Oneil Devonshire of [facility] on 11/18/2023. This over-read does not include interpretation of cardiac or coronary anatomy or pathology. The coronary calcium score interpretation by the cardiologist is attached.  COMPARISON:  None.  FINDINGS: Cardiovascular: Mild atherosclerotic calcifications of the aorta are noted.  Mediastinum/Nodes: There are no enlarged lymph nodes within the visualized mediastinum.  Lungs/Pleura: There is no pleural effusion. Lungs are well aerated bilaterally. Diffuse emphysematous changes are seen. No acute abnormality is seen.  Upper abdomen: Large hiatal hernia is noted. Right renal cyst is seen. No follow-up is recommended.  Musculoskeletal/Chest wall: No chest wall mass or suspicious osseous findings within the visualized chest.  IMPRESSION: Large hiatal hernia.  Aortic Atherosclerosis (ICD10-I70.0) and Emphysema (ICD10-J43.9).   Electronically Signed By: Oneil Devonshire M.D. On: 11/18/2023 00:16  Narrative : CLINICAL DATA:  Cardiovascular Disease Risk stratification  EXAM:  Coronary Calcium Score  TECHNIQUE:  A gated, non-contrast computed tomography scan of the heart was  performed using 3mm slice thickness. Axial images were analyzed on a  dedicated workstation. Calcium scoring of the coronary arteries was  performed using the Agatston method.  FINDINGS:  Coronary Calcium Score:  Left main: 2.29  Left anterior descending artery: 660  Left circumflex artery: 61  Right coronary artery: 687  Total: 1410  Percentile: 91  Pericardium:  Normal.  Ascending Aorta: Normal caliber.  Minimal calcifications of the aortic valve noted.  Pulmonary artery:  Normal caliber  Non-cardiac: See separate report from [facility].  IMPRESSION:  Coronary calcium score of 1410. This was 62 percentile for age-, race-,  and sex-matched controls.  RECOMMENDATIONS:  Coronary artery calcium (CAC) score is a strong predictor of  incident coronary heart disease (CHD) and provides predictive  information beyond traditional risk factors. CAC scoring is  reasonable to use in the decision to withhold, postpone, or initiate  statin therapy in intermediate-risk or selected borderline-risk  asymptomatic adults (age 107-75 years and LDL-C >=70 to <190 mg/dL)  who do not have diabetes or established atherosclerotic  cardiovascular disease (ASCVD).* In intermediate-risk (10-year ASCVD  risk >=7.5% to <20%) adults or selected borderline-risk (10-year  ASCVD risk >=5% to <7.5%) adults in whom a CAC score is measured for  the purpose of making a treatment decision the following  recommendations have been made:  If CAC=0, it is reasonable to withhold statin therapy and reassess  in 5 to 10 years, as long as higher risk conditions are absent  (diabetes mellitus, family history of premature CHD in first degree  relatives (males <55 years; females <65 years), cigarette smoking,  or LDL >=190 mg/dL).  If CAC is 1 to 99, it is reasonable to initiate statin therapy for  patients >=70 years of age.  If CAC is >=100 or >=75th percentile, it is reasonable to initiate  statin therapy at any age.  Cardiology referral should be considered for patients with CAC  scores >=400 or >=75th percentile.  *2018 AHA/ACC/AACVPR/AAPA/ABC/ACPM/ADA/AGS/APhA/ASPC/NLA/PCNA  Guideline on the Management of Blood Cholesterol: A Report of the  American College of Cardiology/American Heart Association Task Force  on Clinical Practice  Guidelines. J Am Coll Cardiol.  2019;73(24):3168-3209.  Electronically Signed: By: Justin [name] M.D. On: 11/11/2023 11:36     ______________________________________________________________________________________________      Risk Assessment/Calculations           Physical Exam VS:  BP 120/68   Pulse 84   Ht 5' 11 (1.803 m)   Wt 181 lb (82.1 kg)   SpO2 97%   BMI 25.24 kg/m        Wt Readings from Last 3 Encounters:  04/29/24 181 lb (82.1 kg)  04/18/24 185 lb 3.2 oz (84 kg)  02/23/24 178 lb 1.6 oz (80.8 kg)    GEN: Well nourished, well developed in no acute distress NECK: No JVD; No carotid bruits CARDIAC: RRR, no murmurs, rubs, gallops RESPIRATORY:  Clear to auscultation without rales, wheezing or rhonchi  ABDOMEN: Soft, non-tender, non-distended EXTREMITIES:  No edema; No deformity   ASSESSMENT AND PLAN Coronary artery disease-calcium score is elevated at 1410, 91st percentile. Stable with no anginal symptoms. No indication for ischemic evaluation.  Continue aspirin 81 mg daily.  I will send in nitroglycerin  and reviewed how to use it when to present to the emergency department. Heart healthy diet and regular cardiovascular exercise encouraged.    Dyslipidemia-most recent LDL was elevated at 124 on 01/26/2024.  Will repeat fasted lipid panel, LFTs, LP(a), currently on Crestor 10 mg daily, will make plans to increase this after we receive his blood work.  Hypertension-his blood pressure is well-controlled today at 120/80, he admits he does not routinely check it at home however when he does he is getting similar readings.  Currently not on an antihypertensive medication.  Tobacco abuse -currently smoking 1 pack/day, he does admit he needs to stop however he is not necessarily ready to do so.  I offered him help with smoking  cessation and at this time he feels he can try it on his own but he is aware he can notify us  if he needs help and we could arrange for either  Wellbutrin or Chantix to help with smoking cessation.        Dispo: CMET, fasting lipid panel, LP(a), nitroglycerin  as needed, follow-up in 1 year.  Signed, Delon JAYSON Hoover, NP

## 2024-04-29 ENCOUNTER — Ambulatory Visit: Attending: Cardiology | Admitting: Cardiology

## 2024-04-29 ENCOUNTER — Encounter: Payer: Self-pay | Admitting: Cardiology

## 2024-04-29 ENCOUNTER — Other Ambulatory Visit (HOSPITAL_BASED_OUTPATIENT_CLINIC_OR_DEPARTMENT_OTHER): Payer: Self-pay | Admitting: Student

## 2024-04-29 VITALS — BP 120/68 | HR 84 | Ht 71.0 in | Wt 181.0 lb

## 2024-04-29 DIAGNOSIS — Z720 Tobacco use: Secondary | ICD-10-CM | POA: Diagnosis not present

## 2024-04-29 DIAGNOSIS — I10 Essential (primary) hypertension: Secondary | ICD-10-CM

## 2024-04-29 DIAGNOSIS — M545 Low back pain, unspecified: Secondary | ICD-10-CM

## 2024-04-29 DIAGNOSIS — E782 Mixed hyperlipidemia: Secondary | ICD-10-CM | POA: Diagnosis not present

## 2024-04-29 DIAGNOSIS — I2510 Atherosclerotic heart disease of native coronary artery without angina pectoris: Secondary | ICD-10-CM

## 2024-04-29 MED ORDER — NITROGLYCERIN 0.4 MG SL SUBL: 0.4000 mg | SUBLINGUAL_TABLET | SUBLINGUAL | 1 refills | Status: AC | PRN

## 2024-04-29 NOTE — Patient Instructions (Signed)
 Medication Instructions:  Your physician has recommended you make the following change in your medication:   START: Nitroglycerin  0.4 mg under the tongue every 5 minutes x 3 as needed for chest pain  *If you need a refill on your cardiac medications before your next appointment, please call your pharmacy*  Lab Work: Your physician recommends that you return for lab work in:   Labs today: CMP, Lipids, LPa  If you have labs (blood work) drawn today and your tests are completely normal, you will receive your results only by: MyChart Message (if you have MyChart) OR A paper copy in the mail If you have any lab test that is abnormal or we need to change your treatment, we will call you to review the results.  Testing/Procedures: None  Follow-Up: At [facility], you and your health needs are our priority.  As part of our continuing mission to provide you with exceptional heart care, our providers are all part of one team.  This team includes your primary Cardiologist (physician) and Advanced Practice Providers or APPs (Physician Assistants and Nurse Practitioners) who all work together to provide you with the care you need, when you need it.  Your next appointment:   1 year(s)  Provider:   Lamar Fitch, MD    We recommend signing up for the patient portal called MyChart.  Sign up information is provided on this After Visit Summary.  MyChart is used to connect with patients for Virtual Visits (Telemedicine).  Patients are able to view lab/test results, encounter notes, upcoming appointments, etc.  Non-urgent messages can be sent to your provider as well.   To learn more about what you can do with MyChart, go to [email or web address].   Other Instructions None

## 2024-04-29 NOTE — Telephone Encounter (Signed)
 Copied from CRM [id number]. Topic: Clinical - Medication Refill >> Apr 25, 2024  3:53 PM Donee H wrote: Medication: gabapentin  (NEURONTIN ) 300 MG capsule  Has the patient contacted their pharmacy? No (Agent: If no, request that the patient contact the pharmacy for the refill. If patient does not wish to contact the pharmacy document the reason why and proceed with request.) (Agent: If yes, when and what did the pharmacy advise?)  This is the patient's preferred pharmacy:  [facility] [address], [place] - [address] [place] [address] Phone: [phone] Fax: [phone]    Is this the correct pharmacy for this prescription? Yes If no, delete pharmacy and type the correct one.   Has the prescription been filled recently? No  Is the patient out of the medication? Yes, patient stated completely out and would like refill as soon as possible   Has the patient been seen for an appointment in the last year OR does the patient have an upcoming appointment? Yes  Can we respond through MyChart? Yes  Agent: Please be advised that Rx refills may take up to 3 business days. We ask that you follow-up with your pharmacy.

## 2024-05-01 LAB — COMPREHENSIVE METABOLIC PANEL WITH GFR
ALT: 16 IU/L (ref 0–44)
AST: 25 IU/L (ref 0–40)
Albumin: 4.4 g/dL (ref 3.9–4.9)
Alkaline Phosphatase: 95 IU/L (ref 44–121)
BUN/Creatinine Ratio: 14 (ref 10–24)
BUN: 14 mg/dL (ref 8–27)
Bilirubin Total: 0.5 mg/dL (ref 0.0–1.2)
CO2: 20 mmol/L (ref 20–29)
Calcium: 9.7 mg/dL (ref 8.6–10.2)
Chloride: 98 mmol/L (ref 96–106)
Creatinine, Ser: 1.02 mg/dL (ref 0.76–1.27)
Globulin, Total: 2.4 g/dL (ref 1.5–4.5)
Glucose: 89 mg/dL (ref 70–99)
Potassium: 4.2 mmol/L (ref 3.5–5.2)
Sodium: 140 mmol/L (ref 134–144)
Total Protein: 6.8 g/dL (ref 6.0–8.5)
eGFR: 80 mL/min/1.73

## 2024-05-01 LAB — LIPOPROTEIN A (LPA): Lipoprotein (a): 10.3 nmol/L

## 2024-05-01 LAB — LIPID PANEL
Chol/HDL Ratio: 3.2 ratio (ref 0.0–5.0)
Cholesterol, Total: 162 mg/dL (ref 100–199)
HDL: 51 mg/dL
LDL Chol Calc (NIH): 89 mg/dL (ref 0–99)
Triglycerides: 122 mg/dL (ref 0–149)
VLDL Cholesterol Cal: 22 mg/dL (ref 5–40)

## 2024-05-03 ENCOUNTER — Ambulatory Visit: Payer: Self-pay | Admitting: Cardiology

## 2024-05-04 DIAGNOSIS — M5459 Other low back pain: Secondary | ICD-10-CM | POA: Diagnosis not present

## 2024-05-04 DIAGNOSIS — M1611 Unilateral primary osteoarthritis, right hip: Secondary | ICD-10-CM | POA: Diagnosis not present

## 2024-05-05 ENCOUNTER — Encounter (HOSPITAL_BASED_OUTPATIENT_CLINIC_OR_DEPARTMENT_OTHER): Payer: Self-pay

## 2024-05-06 ENCOUNTER — Telehealth (HOSPITAL_BASED_OUTPATIENT_CLINIC_OR_DEPARTMENT_OTHER): Payer: Self-pay

## 2024-05-06 ENCOUNTER — Other Ambulatory Visit (HOSPITAL_BASED_OUTPATIENT_CLINIC_OR_DEPARTMENT_OTHER): Payer: Self-pay | Admitting: Student

## 2024-05-06 ENCOUNTER — Telehealth (HOSPITAL_BASED_OUTPATIENT_CLINIC_OR_DEPARTMENT_OTHER): Payer: Self-pay | Admitting: Student

## 2024-05-06 ENCOUNTER — Ambulatory Visit: Payer: Self-pay

## 2024-05-06 DIAGNOSIS — G8929 Other chronic pain: Secondary | ICD-10-CM

## 2024-05-06 MED ORDER — GABAPENTIN 300 MG PO CAPS
ORAL_CAPSULE | ORAL | 1 refills | Status: DC
Start: 2024-05-06 — End: 2024-08-26

## 2024-05-06 NOTE — Telephone Encounter (Signed)
Pt advised and voices understanding.

## 2024-05-06 NOTE — Telephone Encounter (Signed)
 FYI Only or Action Required?: Action required by provider: medication refill request.  Patient was last seen in primary care on 10/07/2023 by Towana Small, FNP.  Called Nurse Triage reporting Pain.  Symptoms began several days ago.  Interventions attempted: Nothing.  Symptoms are: gradually worsening.  Triage Disposition: Call PCP When Office is Open  Patient/caregiver understands and will follow disposition?: Yes   Copied from CRM [id number]. Topic: Clinical - Red Word Triage >> May 06, 2024  8:49 AM Treva T wrote: Red Word that prompted transfer to Nurse Triage: Patient calling states he is having increased terrible pain due to being out of medication (gabapentin ) for 3 days.  Reason for Disposition  Caller requesting a CONTROLLED substance prescription refill (e.g., narcotics, ADHD medicines)  Answer Assessment - Initial Assessment Questions 1. DRUG NAME: What medicine do you need to have refilled?     Gabapentin   2. REFILLS REMAINING: How many refills are remaining? Notes: The label on the medicine or pill bottle will show how many refills are remaining. If there are no refills remaining, then a renewal may be needed.     1  3. EXPIRATION DATE: What is the expiration date? Note: The label states when the prescription will expire, and thus can no longer be refilled.)     Not expired  4. PRESCRIBER: Who prescribed it? Note: The prescribing doctor or group is responsible for refill approvals..     Jacob Rothfuss, PA  5. PHARMACY: Have you contacted your pharmacy (drugstore)? Note: Some pharmacies will contact the doctor (or NP/PA).      Yes, the pharmacy says it is too soon  6. SYMPTOMS: Do you have any symptoms?     Pain all throughout the body  Protocols used: Medication Refill and Renewal Call-A-AH

## 2024-05-06 NOTE — Telephone Encounter (Signed)
 Pt called saying pharmacy will not fill gabapentin  due to being too soon. Pt is taing gabapentin  300mg  2 capsules TID and is running out (rx is written as 2 capsules BID). [place] this is the only way he gets relief. Please advise.

## 2024-05-06 NOTE — Telephone Encounter (Signed)
 Patient called.

## 2024-05-14 DIAGNOSIS — J441 Chronic obstructive pulmonary disease with (acute) exacerbation: Secondary | ICD-10-CM | POA: Diagnosis not present

## 2024-05-20 ENCOUNTER — Other Ambulatory Visit (HOSPITAL_BASED_OUTPATIENT_CLINIC_OR_DEPARTMENT_OTHER): Payer: Self-pay | Admitting: Student

## 2024-05-20 DIAGNOSIS — J441 Chronic obstructive pulmonary disease with (acute) exacerbation: Secondary | ICD-10-CM

## 2024-05-20 DIAGNOSIS — N529 Male erectile dysfunction, unspecified: Secondary | ICD-10-CM

## 2024-05-20 NOTE — Telephone Encounter (Signed)
 Copied from CRM [id number]. Topic: Clinical - Medication Refill >> May 20, 2024  9:10 AM Deaijah H wrote: Medication: albuterol  (VENTOLIN  HFA) 108 (90 Base) MCG/ACT inhaler /tadalafil  (CIALIS ) 5 MG tablet / Fluticasone -Umeclidin-Vilant (TRELEGY ELLIPTA ) 200-62.5-25 MCG/ACT AEPB / ipratropium-albuterol  (DUONEB) 0.5-2.5 (3) MG/3ML SOLN  Has the patient contacted their pharmacy? Yes (Agent: If no, request that the patient contact the pharmacy for the refill. If patient does not wish to contact the pharmacy document the reason why and proceed with request.) (Agent: If yes, when and what did the pharmacy advise?) Advised to call Dr.  This is the patient's preferred pharmacy:  [facility] [address], [place] - [address] [place] [address] Phone: [phone] Fax: [phone]   Is this the correct pharmacy for this prescription? Yes If no, delete pharmacy and type the correct one.   Has the prescription been filled recently? Yes  Is the patient out of the medication? Yes  Has the patient been seen for an appointment in the last year OR does the patient have an upcoming appointment? Yes  Can we respond through MyChart? Yes  Agent: Please be advised that Rx refills may take up to 3 business days. We ask that you follow-up with your pharmacy.

## 2024-05-24 MED ORDER — TRELEGY ELLIPTA 200-62.5-25 MCG/ACT IN AEPB: 1.0000 | INHALATION_SPRAY | Freq: Every day | RESPIRATORY_TRACT | 0 refills | Status: DC

## 2024-05-24 MED ORDER — ALBUTEROL SULFATE HFA 108 (90 BASE) MCG/ACT IN AERS: 2.0000 | INHALATION_SPRAY | RESPIRATORY_TRACT | 1 refills | Status: DC | PRN

## 2024-05-24 MED ORDER — IPRATROPIUM-ALBUTEROL 0.5-2.5 (3) MG/3ML IN SOLN: 3.0000 mL | Freq: Two times a day (BID) | RESPIRATORY_TRACT | 1 refills | Status: AC

## 2024-05-25 ENCOUNTER — Ambulatory Visit (HOSPITAL_BASED_OUTPATIENT_CLINIC_OR_DEPARTMENT_OTHER): Admitting: Student

## 2024-05-25 ENCOUNTER — Encounter (HOSPITAL_BASED_OUTPATIENT_CLINIC_OR_DEPARTMENT_OTHER): Payer: Self-pay | Admitting: Student

## 2024-05-25 ENCOUNTER — Other Ambulatory Visit (HOSPITAL_BASED_OUTPATIENT_CLINIC_OR_DEPARTMENT_OTHER): Payer: Self-pay | Admitting: Student

## 2024-05-25 VITALS — BP 130/78 | HR 69 | Temp 97.5°F | Resp 16 | Ht 71.0 in | Wt 186.5 lb

## 2024-05-25 DIAGNOSIS — M5442 Lumbago with sciatica, left side: Secondary | ICD-10-CM

## 2024-05-25 DIAGNOSIS — J441 Chronic obstructive pulmonary disease with (acute) exacerbation: Secondary | ICD-10-CM | POA: Diagnosis not present

## 2024-05-25 MED ORDER — METHYLPREDNISOLONE 4 MG PO TBPK: ORAL_TABLET | ORAL | 0 refills | Status: DC

## 2024-05-25 MED ORDER — ZTLIDO 1.8 % EX PTCH
1.0000 | MEDICATED_PATCH | CUTANEOUS | 3 refills | Status: DC
Start: 2024-05-25 — End: 2024-05-25

## 2024-05-25 NOTE — Patient Instructions (Signed)
 It was nice to see you today!  If you have any problems before your next visit feel free to message me via MyChart (minor issues or questions) or call the office, otherwise you may reach out to schedule an office visit.  Thank you! Pau Banh, PA-C

## 2024-05-25 NOTE — Progress Notes (Signed)
 Established Patient Office Visit  Subjective   Patient ID: Justin [name], male    DOB: 19-Apr-1956  Age: 68 y.o. MRN: [id number]  Chief Complaint  Patient presents with   Medical Management of Chronic Issues    Follow up. Needs referral to lung specialist in [place], is not going to the [facility]. Doxepin  helps sleep 3-4 hours.     HPI  Discussed the use of AI scribe software for clinical note transcription with the patient, who gave verbal consent to proceed.  History of Present Illness   Justin [name] is a 68 year old male with COPD who presents for a referral to a pulmonologist and evaluation of new low back pain.  He is seeking a referral to a pulmonologist due to his COPD. He has previously seen a pulmonologist in [place] a long time ago. He is currently using Trelegy, which helps with his breathing, although he notes some restriction. He has not had a low dose CT of his chest this year, but his recent CT coronary calcium can likely be used to sub for this year to avoid excess radiation.  He reports new low back pain that has been present for four weeks. The pain is located on the right side of his back, radiating to his hip and down his leg. He describes the pain as sharp in the morning, making it difficult to walk upon getting out of bed, but it improves with a warm shower. He has been using lidocaine  patches and has tried Voltaren gel without relief. He has not experienced any electric sensation down his leg, and the pain is described as dull and achy. He has not had any prior injections in his back.  He mentions having torn cartilage in his rib cage that occasionally 'pops' and causes pain similar to a busted rib, lasting for weeks. He has not had any recent imaging for this issue.  He is currently taking doxepin , which helps him sleep for a few hours each night. He is also using pain patches, which provide some relief. He mentions needing refills for some  of his medications.  No electric sensation down his leg.       Patient Active Problem List   Diagnosis Date Noted   CAD (coronary artery disease)    History of kidney stones    Hyperlipidemia    Insomnia    Crohn's disease with complication (HCC) 04/18/2024   Erectile dysfunction 04/18/2024   Seasonal allergies 04/18/2024   Hiatal hernia 01/26/2024   Gastroesophageal reflux disease without esophagitis 01/26/2024   Sleep disturbances 01/26/2024   Aortic atherosclerosis (HCC) 01/26/2024   Noncompliance 01/26/2024   Elevated coronary artery calcium score 01/26/2024   Contusion of chest 10/28/2023   Costal chondritis 10/28/2023   Essential hypertension 10/28/2023   Smoking 10/28/2023   Chronic, continuous use of opioids 07/08/2023   COPD, frequent exacerbations (HCC) 04/08/2023   Elevated blood pressure reading in office without diagnosis of hypertension 04/08/2023   Tobacco use 03/12/2023   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 08/12/2019   Small bowel obstruction (HCC) 04/04/2016   Seborrheic dermatitis of scalp 05/09/2014   Skin lesion of hand 10/04/2013   IBS (irritable bowel syndrome) 12/20/2012   Chronic pain 08/25/2012   Chronic foot pain 03/10/2012   Anxiety 07/05/2011   BACK PAIN, CHRONIC 10/29/2009   INSOMNIA, CHRONIC 04/27/2009   ADENOCARCINOMA, COLON 01/04/2009   GLUCOSE INTOLERANCE 01/04/2009   Dyslipidemia 11/13/2008   Depression  with anxiety 11/13/2008   OVERACTIVE BLADDER 11/13/2008   BENIGN PROSTATIC HYPERTROPHY 11/13/2008   Multiple fractures 08/20/2008   Colon cancer (HCC) 10/20/2001   Past Medical History:  Diagnosis Date   ADENOCARCINOMA, COLON 01/04/2009   Qualifier: Diagnosis of   By: Tanda MD, Jay      Had follow up colo 2014 and 5 year follow up was recommended  Replacing diagnoses that were inactivated after the 01/19/23 regulatory import     Anxiety    takes approximately 10 diazepam /month-contracts to no more than  that amount   Aortic atherosclerosis (HCC) 01/26/2024   BACK PAIN, CHRONIC 10/29/2009   Qualifier: Diagnosis of   By: Georgian ROSALEA CHARM Lamar     Replacing diagnoses that were inactivated after the 01/19/23 regulatory import     CAD (coronary artery disease)    calcium score 1410   Chronic foot pain 03/10/2012   Chronic pain 08/25/2012   Chronic, continuous use of opioids 07/08/2023   Colon cancer (HCC) 10/20/2001   specialist due for repeat colonoscopy in 2012   Contusion of chest 10/28/2023   COPD, frequent exacerbations (HCC) 04/08/2023   Costal chondritis 10/28/2023   Crohn's disease with complication (HCC) 04/18/2024   Depression with anxiety 11/13/2008   Qualifier: Diagnosis of   By: Tanda MD, LauraLee         Dyslipidemia 11/13/2008   Qualifier: Diagnosis of   By: Tanda MD, LauraLee         Elevated blood pressure reading in office without diagnosis of hypertension 04/08/2023   Elevated coronary artery calcium score 01/26/2024   Erectile dysfunction 04/18/2024   Essential hypertension 10/28/2023   Gastroesophageal reflux disease without esophagitis 01/26/2024   GLUCOSE INTOLERANCE 01/04/2009   Qualifier: Diagnosis of   By: Tanda MD, LauraLee         Hiatal hernia 01/26/2024   History of kidney stones    Hyperlipidemia    IBS (irritable bowel syndrome) 12/20/2012   Insomnia    INSOMNIA, CHRONIC 04/27/2009   Qualifier: Diagnosis of   By: Georgian ROSALEA CHARM Lamar        Multiple fractures 08/20/2008   patient fell off a rood about 13 feet onto concrete-multiple fractures including vertrebral   Noncompliance 01/26/2024   OVERACTIVE BLADDER 11/13/2008   Qualifier: Diagnosis of   By: Tanda MD, LauraLee         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    Objective:     BP 130/78   Pulse 69   Temp (!) 97.5 F (36.4 C) (Oral)   Resp 16   Ht 5' 11 (1.803 m)   Wt 186 lb 8 oz (84.6 kg)   SpO2 95%   BMI 26.01 kg/m  BP Readings from Last 3 Encounters:  05/25/24 130/78  04/29/24 120/68  04/18/24 130/82   Wt Readings from Last 3 Encounters:  05/25/24 186 lb 8 oz (84.6 kg)  04/29/24 181 lb (82.1 kg)  04/18/24 185 lb 3.2 oz (84 kg)      Physical Exam Constitutional:      General: He is not in acute distress.    Appearance:  Normal appearance. He is not ill-appearing.  HENT:     Head: Normocephalic and atraumatic.     Right Ear: External ear normal.     Left Ear: External ear normal.     Nose: Nose normal.  Eyes:     Conjunctiva/sclera: Conjunctivae normal.  Cardiovascular:     Rate and Rhythm: Normal rate and regular rhythm.     Pulses: Normal pulses.     Heart sounds: Normal heart sounds. No murmur heard.    No friction rub.  Pulmonary:     Effort: Pulmonary effort is normal. No respiratory distress.     Breath sounds: Normal breath sounds. No wheezing, rhonchi or rales.  Skin:    General: Skin is warm and dry.     Coloration: Skin is not jaundiced or pale.  Neurological:     Mental Status: He is alert.  Psychiatric:        Mood and Affect: Mood normal.        Behavior: Behavior normal.      No results found for any visits on 05/25/24.  Last CBC Lab Results  Component Value Date   WBC 6.6 01/26/2024   HGB 14.6 01/26/2024   HCT 43.7 01/26/2024   MCV 95 01/26/2024   MCH 31.7 01/26/2024   RDW 11.7 01/26/2024   PLT 203 01/26/2024   Last metabolic panel Lab Results  Component Value Date   GLUCOSE 89 04/29/2024   NA 140 04/29/2024   K 4.2 04/29/2024   CL 98 04/29/2024   CO2 20 04/29/2024   BUN 14 04/29/2024   CREATININE 1.02 04/29/2024   EGFR 80 04/29/2024   CALCIUM 9.7 04/29/2024   PROT 6.8  04/29/2024   ALBUMIN 4.4 04/29/2024   LABGLOB 2.4 04/29/2024   AGRATIO 1.2 03/24/2023   BILITOT 0.5 04/29/2024   ALKPHOS 95 04/29/2024   AST 25 04/29/2024   ALT 16 04/29/2024   ANIONGAP 8 03/13/2023   Last lipids Lab Results  Component Value Date   CHOL 162 04/29/2024   HDL 51 04/29/2024   LDLCALC 89 04/29/2024   TRIG 122 04/29/2024   CHOLHDL 3.2 04/29/2024   Last hemoglobin A1c Lab Results  Component Value Date   HGBA1C 5.7 (H) 01/26/2024      The 10-year ASCVD risk score (Arnett DK, et al., 2019) is: 22%    Assessment & Plan:   Assessment and Plan    Low back pain with right-sided radiation to hip and leg New onset low back pain with radiation to the right hip and leg, persisting for four weeks. Pain is sharp in the morning and improves with a warm shower. No tenderness on examination, likely due to arthritis. - Prescribe Medrol  Dose Pack for inflammation and pain relief. - Discuss potential for spinal stimulator with pain management. - Consider referral to [facility] in [place] for physical therapy and alternative treatments. - Discuss possibility of injections with pain management or at [facility] if pain persists.  Chronic obstructive pulmonary disease (COPD) Chronic, stable though has a history of frequent exacerbations. History of the same on trelegy. Breathing is slightly restricted, but Trelegy is providing relief. - Send referral to [facility] in [facility] for pulmonology consultation.  Medication management: Cialis  and nitroglycerin  interaction Potential interaction between Cialis  and nitroglycerin , which could lead to severe hypotension if taken together. He does not regularly take nitroglycerin  but has it for emergency use. - Contact cardiologist to discuss potential approval of Cialis  use with nitroglycerin   on standby.      Return in about 3 months (around 08/25/2024) for Chronic Followup.    Kaiser Belluomini T Tyrese Ficek, PA-C

## 2024-05-31 DIAGNOSIS — J441 Chronic obstructive pulmonary disease with (acute) exacerbation: Secondary | ICD-10-CM | POA: Diagnosis not present

## 2024-05-31 DIAGNOSIS — R52 Pain, unspecified: Secondary | ICD-10-CM | POA: Diagnosis not present

## 2024-06-02 ENCOUNTER — Encounter (HOSPITAL_BASED_OUTPATIENT_CLINIC_OR_DEPARTMENT_OTHER): Payer: Self-pay

## 2024-06-02 ENCOUNTER — Ambulatory Visit (HOSPITAL_BASED_OUTPATIENT_CLINIC_OR_DEPARTMENT_OTHER): Admitting: Student

## 2024-06-02 ENCOUNTER — Ambulatory Visit: Payer: Self-pay

## 2024-06-02 DIAGNOSIS — I4510 Unspecified right bundle-branch block: Secondary | ICD-10-CM | POA: Diagnosis not present

## 2024-06-02 DIAGNOSIS — R918 Other nonspecific abnormal finding of lung field: Secondary | ICD-10-CM | POA: Diagnosis not present

## 2024-06-02 DIAGNOSIS — J180 Bronchopneumonia, unspecified organism: Secondary | ICD-10-CM | POA: Diagnosis not present

## 2024-06-02 DIAGNOSIS — R9431 Abnormal electrocardiogram [ECG] [EKG]: Secondary | ICD-10-CM | POA: Diagnosis not present

## 2024-06-02 DIAGNOSIS — A419 Sepsis, unspecified organism: Secondary | ICD-10-CM | POA: Diagnosis not present

## 2024-06-02 DIAGNOSIS — I2510 Atherosclerotic heart disease of native coronary artery without angina pectoris: Secondary | ICD-10-CM | POA: Diagnosis not present

## 2024-06-02 DIAGNOSIS — I444 Left anterior fascicular block: Secondary | ICD-10-CM | POA: Diagnosis not present

## 2024-06-02 NOTE — Telephone Encounter (Signed)
 FYI Only or Action Required?: FYI only for provider.  Patient was last seen in primary care on 10/07/2023 by Towana Small, FNP.  Called Nurse Triage reporting coughing up blood.  Symptoms began several days ago.  Interventions attempted: Nothing.  Symptoms are: gradually worsening.  Triage Disposition: Go to ED Now (Notify PCP)  Patient/caregiver understands and will follow disposition?: Yes     Copied from CRM [id number]. Topic: Clinical - Red Word Triage >> Jun 02, 2024  8:44 AM Debby BROCKS wrote: Red Word that prompted transfer to Nurse Triage: Trouble breathing and coughing blood. Wants an xray done Reason for Disposition  [1] MODERATE difficulty breathing (e.g., speaks in phrases, SOB even at rest, pulse 100-120) AND [2] still present when not coughing  Answer Assessment - Initial Assessment Questions 1. ONSET: When did the cough begin?      Two days ago was working in [employer] and O2 level dropped into the 80's 2. SEVERITY: How bad is the cough today? Did the blood appear after a coughing spell?      Last night severe 3. SPUTUM: Describe the color of your sputum (e.g., none, dry cough; clear, white, yellow, green)     blood 4. HEMOPTYSIS: How much blood? (e.g., flecks, streaks, tablespoons)     spots 5. DIFFICULTY BREATHING: Are you having difficulty breathing? If Yes, ask: How bad is it? (e.g., mild, moderate, severe)      Yes,breathing heavy 6. FEVER: Do you have a fever? If Yes, ask: What is your temperature, how was it measured, and when did it start?     Fever two days ago 101 F 7. CARDIAC HISTORY: Do you have any history of heart disease? (e.g., heart attack, congestive heart failure)      denies 8. LUNG HISTORY: Do you have any history of lung disease?  (e.g., pulmonary embolus, asthma, emphysema)     copd 9. PE RISK FACTORS: Do you have a history of blood clots? Note: Other risk factors include recent major surgery, recent prolonged  travel, being bedridden.     denies 10. OTHER SYMPTOMS: Do you have any other symptoms? (e.g., runny nose, wheezing, chest pain)       Chest wheezing 11. PREGNANCY: Is there any chance you are pregnant? When was your last menstrual period?       na 12. TRAVEL: Have you traveled out of the country in the last month? (e.g., travel history, exposures)       no  Protocols used: Coughing Up Blood-A-AH

## 2024-06-09 ENCOUNTER — Other Ambulatory Visit (HOSPITAL_BASED_OUTPATIENT_CLINIC_OR_DEPARTMENT_OTHER): Payer: Self-pay | Admitting: Student

## 2024-06-09 DIAGNOSIS — G4700 Insomnia, unspecified: Secondary | ICD-10-CM

## 2024-06-09 DIAGNOSIS — F418 Other specified anxiety disorders: Secondary | ICD-10-CM

## 2024-06-09 MED ORDER — DOXEPIN HCL 10 MG PO CAPS
10.0000 mg | ORAL_CAPSULE | Freq: Every evening | ORAL | 5 refills | Status: DC | PRN
Start: 2024-06-09 — End: 2024-08-29

## 2024-06-09 NOTE — Telephone Encounter (Signed)
 Copied from CRM #[id number]. Topic: Clinical - Medication Refill >> Jun 09, 2024  9:55 AM Donna BRAVO wrote: Patient is taking 2 pills a night, other wise he can't sleep  Medication:  doxepin  (SINEQUAN ) 10 MG capsule [[id number]] ENDED  Has the patient contacted their pharmacy? No Calling provider first due to taking 2 pills at night  This is the patient's preferred pharmacy:   [facility] [address] [place] [facility] [address] [place] [address] Phone: [phone] Fax: [phone]   Is this the correct pharmacy for this prescription? Yes If no, delete pharmacy and type the correct one.   Has the prescription been filled recently? Yes  Is the patient out of the medication? Yes  Has the patient been seen for an appointment in the last year OR does the patient have an upcoming appointment? Yes  Can we respond through MyChart? Yes  Agent: Please be advised that Rx refills may take up to 3 business days. We ask that you follow-up with your pharmacy.

## 2024-06-10 DIAGNOSIS — M5410 Radiculopathy, site unspecified: Secondary | ICD-10-CM | POA: Diagnosis not present

## 2024-06-10 DIAGNOSIS — M549 Dorsalgia, unspecified: Secondary | ICD-10-CM | POA: Diagnosis not present

## 2024-06-10 DIAGNOSIS — Z79891 Long term (current) use of opiate analgesic: Secondary | ICD-10-CM | POA: Diagnosis not present

## 2024-06-10 DIAGNOSIS — M47816 Spondylosis without myelopathy or radiculopathy, lumbar region: Secondary | ICD-10-CM | POA: Diagnosis not present

## 2024-06-14 DIAGNOSIS — J441 Chronic obstructive pulmonary disease with (acute) exacerbation: Secondary | ICD-10-CM | POA: Diagnosis not present

## 2024-06-17 DIAGNOSIS — M47816 Spondylosis without myelopathy or radiculopathy, lumbar region: Secondary | ICD-10-CM | POA: Diagnosis not present

## 2024-06-17 DIAGNOSIS — G894 Chronic pain syndrome: Secondary | ICD-10-CM | POA: Diagnosis not present

## 2024-06-17 DIAGNOSIS — M5116 Intervertebral disc disorders with radiculopathy, lumbar region: Secondary | ICD-10-CM | POA: Diagnosis not present

## 2024-06-17 DIAGNOSIS — M48061 Spinal stenosis, lumbar region without neurogenic claudication: Secondary | ICD-10-CM | POA: Diagnosis not present

## 2024-06-27 ENCOUNTER — Telehealth (HOSPITAL_BASED_OUTPATIENT_CLINIC_OR_DEPARTMENT_OTHER): Payer: Self-pay

## 2024-06-27 ENCOUNTER — Encounter (HOSPITAL_BASED_OUTPATIENT_CLINIC_OR_DEPARTMENT_OTHER): Payer: Self-pay

## 2024-06-27 NOTE — Telephone Encounter (Signed)
 Copied from CRM #[id number]. Topic: Clinical - Medical Advice >> Jun 27, 2024 11:15 AM Carlatta H wrote: Reason for CRM: Patient tested positive for covid  today and would like advice//Please call

## 2024-06-28 ENCOUNTER — Other Ambulatory Visit (HOSPITAL_BASED_OUTPATIENT_CLINIC_OR_DEPARTMENT_OTHER): Payer: Self-pay | Admitting: Student

## 2024-06-28 ENCOUNTER — Telehealth (HOSPITAL_BASED_OUTPATIENT_CLINIC_OR_DEPARTMENT_OTHER): Payer: Self-pay | Admitting: Student

## 2024-06-28 DIAGNOSIS — U071 COVID-19: Secondary | ICD-10-CM

## 2024-06-28 MED ORDER — NIRMATRELVIR/RITONAVIR (PAXLOVID)TABLET: 3.0000 | ORAL_TABLET | Freq: Two times a day (BID) | ORAL | 0 refills | Status: AC

## 2024-06-28 NOTE — Telephone Encounter (Signed)
 Pt advised [facility] He is aware to start it today.

## 2024-06-28 NOTE — Telephone Encounter (Signed)
 Pt said he has been aching all over. No fever. Sxs started 5 days ago. Thinks he is over the worst of it. Is having trouble breathing. Was sick on stomach all day yesterday. Did not throw up. Feeling a little better.

## 2024-06-28 NOTE — Telephone Encounter (Signed)
 Copied from CRM #[id number]. Topic: Clinical - Medical Advice >> Jun 28, 2024  9:37 AM Carlatta H wrote: Reason for CRM: The patient received a call from the office this morning//Please call back if needed//The patient advised to leave a detail message

## 2024-06-28 NOTE — Telephone Encounter (Signed)
 Copied from CRM #[id number]. Topic: Clinical - Medical Advice >> Jun 28, 2024  8:16 AM Thersia BROCKS wrote: Patient called back in regarding missed call from office , would like a callback regarding medical advice on what he needs to do

## 2024-06-29 ENCOUNTER — Telehealth (HOSPITAL_BASED_OUTPATIENT_CLINIC_OR_DEPARTMENT_OTHER): Payer: Self-pay | Admitting: Student

## 2024-06-29 NOTE — Telephone Encounter (Signed)
 Copied from CRM [id number]. Topic: Clinical - Prescription Issue >> Jun 28, 2024  5:31 PM Lauren C wrote: Reason for CRM: Pt attempted to pick up paxlovid  at the pharmacy and it was over $300. He also tried to pick up his inhaler and it was over $100 with insurance. Pt wants to know if there is anything else he can take that would be cheaper for both of these. Does pt need prior auth?

## 2024-06-29 NOTE — Telephone Encounter (Signed)
 Spoke to pt. He said paxlovid  was about $368.00 and trelegy was over $100. [facility], [facility] said paxlovid  is not covered by government anymore and this is price with insurance (it is $1500 without ins) and trellegy is also on insurance ($800+ without). No PA's are needed. These are his copays. Can pt get something cheaper?

## 2024-06-30 NOTE — Telephone Encounter (Signed)
 I explained to pt how to search trelegy coupon on their website. He will try it and call back if any issues. Pt advised to not worry about the paxlovid  at this point.

## 2024-07-01 ENCOUNTER — Other Ambulatory Visit (HOSPITAL_BASED_OUTPATIENT_CLINIC_OR_DEPARTMENT_OTHER): Payer: Self-pay | Admitting: Student

## 2024-07-01 ENCOUNTER — Ambulatory Visit: Payer: Self-pay

## 2024-07-01 DIAGNOSIS — J441 Chronic obstructive pulmonary disease with (acute) exacerbation: Secondary | ICD-10-CM

## 2024-07-01 NOTE — Telephone Encounter (Signed)
 Copied from CRM #[id number]. Topic: Clinical - Medication Refill >> Jul 01, 2024 12:39 PM Roselie C wrote: Medication: Fluticasone -Umeclidin-Vilant (TRELEGY ELLIPTA ) 200-62.5-25 MCG/ACT AEPB [[id number]],$MzfnczAzqnmzIZPI_xkzlUfTuhbuJoHNcDwsVVhgYoWhypDjX$$MzfnczAzqnmzIZPI_xkzlUfTuhbuJoHNcDwsVVhgYoWhypDjX$  (VENTOLIN  HFA) 108 (90 Base) MCG/ACT inhaler   Has the patient contacted their pharmacy? Yes (Agent: If no, request that the patient contact the pharmacy for the refill. If patient does not wish to contact the pharmacy document the reason why and proceed with request.) (Agent: If yes, when and what did the pharmacy advise?)  This is the patient's preferred pharmacy:  [facility] [address] [place] - [address] [place] [address] Phone: [phone] Fax: [phone]  Is this the correct pharmacy for this prescription? Yes If no, delete pharmacy and type the correct one.   Has the prescription been filled recently? Yes  Is the patient out of the medication? Yes  Has the patient been seen for an appointment in the last year OR does the patient have an upcoming appointment? Yes  Can we respond through MyChart? No   Agent: Please be advised that Rx refills may take up to 3 business days. We ask that you follow-up with your pharmacy.

## 2024-07-01 NOTE — Telephone Encounter (Signed)
 FYI Only or Action Required?: Action required by provider: medication refill request.  Patient was last seen in primary care on 05/25/2024 by Rothfuss, Lang DASEN, PA-C.  Called Nurse Triage reporting Medication Refill.   Symptoms are: stable.  Triage Disposition: Call PCP Now  Patient/caregiver understands and will follow disposition?: Yes     Copied from CRM #[id number]. Topic: Clinical - Red Word Triage >> Jul 01, 2024 12:42 PM Justin [name] wrote: Red Word that prompted transfer to Nurse Triage: Patient called to refill  Fluticasone -Umeclidin-VilantB  and albuterol  , I requested refill. And when told it cold be 3 bus days . Patient stated he is having problems breathing, and having asthma attacks Reason for Disposition  [1] Prescription refill request for ESSENTIAL medicine (i.e., likelihood of harm to patient if not taken) AND [2] triager unable to refill per department policy  Answer Assessment - Initial Assessment Questions 1. RESPIRATORY STATUS: Describe your breathing? (e.g., wheezing, shortness of breath, unable to speak, severe coughing)      Currently has COVID PNA 2. ONSET: When did this asthma attack begin?      Last week 3. TRIGGER: What do you think triggered this attack? (e.g., URI, exposure to pollen or other allergen, tobacco smoke)      URI 4. PEAK EXPIRATORY FLOW RATE (PEFR): Do you use a peak flow meter? If Yes, ask: What's the current peak flow? What's your personal best peak flow?      N/a 5. SEVERITY: How bad is this attack?      *No Answer* 6. ASTHMA MEDICINES:  What treatments have you tried?      *No Answer* 7. INHALED QUICK-RELIEF TREATMENTS FOR THIS ATTACK: What treatments have you given yourself so far? and How many and how often? If using an inhaler, ask, How many puffs? Note: Routine treatments are 2 puffs every 4 hours as needed. Rescue treatments are 4 puffs repeated every 20 minutes, up to three times as needed.      *No Answer* 8.  OTHER SYMPTOMS: Do you have any other symptoms? (e.g., chest pain, coughing up yellow sputum, fever, runny nose)     *No Answer* 9. O2 SATURATION MONITOR:  Do you use an oxygen  saturation monitor (pulse oximeter) at home? If Yes, What is your reading (oxygen  level) today? What is your usual oxygen  saturation reading? (e.g., 95%)     *No Answer* 10. PREGNANCY: Is there any chance you are pregnant? When was your last menstrual period?       *No Answer*  Answer Assessment - Initial Assessment Questions 1. DRUG NAME: What medicine do you need to have refilled?     Albuterol  trelegy 2. REFILLS REMAINING: How many refills are remaining? Notes: The label on the medicine or pill bottle will show how many refills are remaining. If there are no refills remaining, then a renewal may be needed.     0 3. EXPIRATION DATE: What is the expiration date? Note: The label states when the prescription will expire, and thus can no longer be refilled.)     N/a 4. PRESCRIBER: Who prescribed it? Note: The prescribing doctor or group is responsible for refill approvals.SABRA     PCP 5. PHARMACY: Have you contacted your pharmacy (drugstore)? Note: Some pharmacies will contact the doctor (or NP/PA).      On file 6. SYMPTOMS: Do you have any symptoms?     Recently dx with COVID PNA, wanting refill on INHs since [place] in [place] and  would like to prevent any further worsening of sx 7. PREGNANCY: Is there any chance that you are pregnant? When was your last menstrual period?     N/a    Triager noted that Trelegy refill is currently pending, will modify request to add albuterol .  Protocols used: Asthma Attack-A-AH, Medication Refill and Renewal Call-A-AH

## 2024-07-04 ENCOUNTER — Encounter (HOSPITAL_BASED_OUTPATIENT_CLINIC_OR_DEPARTMENT_OTHER): Payer: Self-pay

## 2024-07-05 MED ORDER — TRELEGY ELLIPTA 200-62.5-25 MCG/ACT IN AEPB: 1.0000 | INHALATION_SPRAY | Freq: Every day | RESPIRATORY_TRACT | 5 refills | Status: DC

## 2024-07-05 MED ORDER — ALBUTEROL SULFATE HFA 108 (90 BASE) MCG/ACT IN AERS: 2.0000 | INHALATION_SPRAY | RESPIRATORY_TRACT | 5 refills | Status: AC | PRN

## 2024-07-08 ENCOUNTER — Ambulatory Visit

## 2024-07-08 ENCOUNTER — Ambulatory Visit (INDEPENDENT_AMBULATORY_CARE_PROVIDER_SITE_OTHER)

## 2024-07-08 VITALS — BP 133/90 | HR 83 | Temp 98.5°F | Ht 71.0 in | Wt 187.6 lb

## 2024-07-08 DIAGNOSIS — Z23 Encounter for immunization: Secondary | ICD-10-CM | POA: Diagnosis not present

## 2024-07-08 DIAGNOSIS — R918 Other nonspecific abnormal finding of lung field: Secondary | ICD-10-CM | POA: Diagnosis not present

## 2024-07-08 DIAGNOSIS — U071 COVID-19: Secondary | ICD-10-CM | POA: Diagnosis not present

## 2024-07-08 DIAGNOSIS — Z720 Tobacco use: Secondary | ICD-10-CM

## 2024-07-08 DIAGNOSIS — J439 Emphysema, unspecified: Secondary | ICD-10-CM | POA: Diagnosis not present

## 2024-07-08 DIAGNOSIS — F17210 Nicotine dependence, cigarettes, uncomplicated: Secondary | ICD-10-CM

## 2024-07-08 DIAGNOSIS — J441 Chronic obstructive pulmonary disease with (acute) exacerbation: Secondary | ICD-10-CM

## 2024-07-08 DIAGNOSIS — J1282 Pneumonia due to coronavirus disease 2019: Secondary | ICD-10-CM

## 2024-07-08 DIAGNOSIS — R0989 Other specified symptoms and signs involving the circulatory and respiratory systems: Secondary | ICD-10-CM | POA: Diagnosis not present

## 2024-07-08 MED ORDER — BUPROPION HCL ER (SR) 150 MG PO TB12: ORAL_TABLET | ORAL | 0 refills | Status: DC

## 2024-07-08 NOTE — Assessment & Plan Note (Signed)
 Advised to quit smoking.  At least 3 minutes of smoking counseling cessation. Started on Wellbutrin  to aid in smoking cessation.  Advised to call [phone] to also assist.
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
## 2024-07-08 NOTE — Addendum Note (Signed)
 Addended by: Oumar Marcott M on: 07/08/2024 09:31 AM   Modules accepted: Orders

## 2024-07-08 NOTE — Assessment & Plan Note (Signed)
 68 year old male with active tobacco use, 2 exacerbations in the past year.  On Trelegy, continues to smoke cigarettes.  Advised to quit smoking.  Can consider adding every other day azithromycin  if he has more exacerbations.  For now decided to continue Trelegy and albuterol  rescue as needed.  He can continue to use his nebulizer machine.  Plan to obtain PFTs Will give pneumonia shot -Can consider pulmonary rehab in the future although currently he is active and working

## 2024-07-08 NOTE — Patient Instructions (Addendum)
 It was nice meeting you in the clinic today.  Please quit smoking cigarettes as that would help with your breathing.  Start taking Wellbutrin  once daily for the first week before you quit smoking.  Can increase the dose to twice daily on the day you quit. Call 1-800-quit-NOW to get free nicotine  replacement and counseling from the state of [place] .     Continue using your Trelegy inhaler and the albuterol  rescue as needed  We will get pulmonary function tests for you to evaluate your lung.  Also get a CT scan of your chest in January 2026.  Given that you had a pneumonia 2 weeks ago we will get a repeat x-ray today.  Please come back to follow-up in 3 months

## 2024-07-14 ENCOUNTER — Other Ambulatory Visit (HOSPITAL_BASED_OUTPATIENT_CLINIC_OR_DEPARTMENT_OTHER): Payer: Self-pay | Admitting: Student

## 2024-07-14 DIAGNOSIS — J302 Other seasonal allergic rhinitis: Secondary | ICD-10-CM

## 2024-07-15 ENCOUNTER — Telehealth (HOSPITAL_BASED_OUTPATIENT_CLINIC_OR_DEPARTMENT_OTHER): Payer: Self-pay

## 2024-07-15 DIAGNOSIS — J441 Chronic obstructive pulmonary disease with (acute) exacerbation: Secondary | ICD-10-CM | POA: Diagnosis not present

## 2024-07-15 NOTE — Telephone Encounter (Signed)
 Copied from CRM #[id number]. Topic: Referral - Request for Referral >> Jul 15, 2024  8:14 AM Thersia BROCKS wrote: Did the patient discuss referral with their provider in the last year? Yes (If No - schedule appointment) (If Yes - send message)  Appointment offered? Yes  Type of order/referral and detailed reason for visit: Orthopedics   Preference of office, provider, location:  Richard D. Bonner, MD [address] ([address]) [place], [place] [address] Phone: [phone] Fax: [phone]  If referral order, have you been seen by this specialty before? No (If Yes, this issue or another issue? When? Where?  Can we respond through MyChart? Yes

## 2024-07-18 ENCOUNTER — Other Ambulatory Visit (HOSPITAL_BASED_OUTPATIENT_CLINIC_OR_DEPARTMENT_OTHER): Payer: Self-pay | Admitting: Student

## 2024-07-18 ENCOUNTER — Encounter (HOSPITAL_BASED_OUTPATIENT_CLINIC_OR_DEPARTMENT_OTHER): Payer: Self-pay

## 2024-07-18 DIAGNOSIS — M47816 Spondylosis without myelopathy or radiculopathy, lumbar region: Secondary | ICD-10-CM

## 2024-07-18 DIAGNOSIS — M1610 Unilateral primary osteoarthritis, unspecified hip: Secondary | ICD-10-CM

## 2024-07-18 DIAGNOSIS — M5410 Radiculopathy, site unspecified: Secondary | ICD-10-CM

## 2024-07-18 NOTE — Telephone Encounter (Signed)
Pt advised and voices understanding.

## 2024-07-19 DIAGNOSIS — G894 Chronic pain syndrome: Secondary | ICD-10-CM | POA: Diagnosis not present

## 2024-07-19 DIAGNOSIS — M169 Osteoarthritis of hip, unspecified: Secondary | ICD-10-CM | POA: Diagnosis not present

## 2024-07-22 DIAGNOSIS — M5410 Radiculopathy, site unspecified: Secondary | ICD-10-CM | POA: Diagnosis not present

## 2024-07-22 DIAGNOSIS — M47816 Spondylosis without myelopathy or radiculopathy, lumbar region: Secondary | ICD-10-CM | POA: Diagnosis not present

## 2024-07-22 DIAGNOSIS — M1612 Unilateral primary osteoarthritis, left hip: Secondary | ICD-10-CM | POA: Diagnosis not present

## 2024-07-22 DIAGNOSIS — M169 Osteoarthritis of hip, unspecified: Secondary | ICD-10-CM | POA: Diagnosis not present

## 2024-07-24 DIAGNOSIS — M160 Bilateral primary osteoarthritis of hip: Secondary | ICD-10-CM | POA: Diagnosis not present

## 2024-08-01 ENCOUNTER — Other Ambulatory Visit (HOSPITAL_BASED_OUTPATIENT_CLINIC_OR_DEPARTMENT_OTHER): Payer: Self-pay | Admitting: Student

## 2024-08-01 ENCOUNTER — Ambulatory Visit: Payer: Self-pay

## 2024-08-01 ENCOUNTER — Telehealth (HOSPITAL_BASED_OUTPATIENT_CLINIC_OR_DEPARTMENT_OTHER): Payer: Self-pay

## 2024-08-01 DIAGNOSIS — G4700 Insomnia, unspecified: Secondary | ICD-10-CM

## 2024-08-01 MED ORDER — DAYVIGO 5 MG PO TABS: ORAL_TABLET | ORAL | 2 refills | Status: DC

## 2024-08-01 NOTE — Progress Notes (Signed)
 Telephone call: Doxepin  is not working. He tried clonazepam- discussed risks of respiratory depression with opiates. Insomnia resistant to doxepin , trazodone , and he believes he tried seroquel. States he has tried everything and the only that has worked is valium .

## 2024-08-01 NOTE — Telephone Encounter (Signed)
 Copied from CRM [id number]. Topic: Clinical - Medication Question >> Aug 01, 2024  4:14 PM Teressa P wrote: Reason for CRM: patient called back saying Clonazepam 1 mg is the medication he took last night and that helped him sleep,  He was asking if the provider would prescribe that for him  [phone]

## 2024-08-01 NOTE — Telephone Encounter (Signed)
 Reason for Triage: patient states he is feeling sick from his medication doxepin  (SINEQUAN ) 10 MG capsule. He wants to know if he can be prescribed another medication?   Reason for Disposition . Taking prescription medication that could cause nausea (e.g., narcotics/opiates, antibiotics, OCPs, many others)  Answer Assessment - Initial Assessment Questions Pt stopped taking Doxepin  due to nausea several weeks ago. I feel better. I'm not sick anymore but I'm not sleeping. I wasn't sleeping  well anyway. I tried some  medication from my friend and it's worked. Pt doesn't have medication on hand to share name. Pt wants new medication to help sleep and not feel nauseated.  No appt made at this time. Please advise.       1. NAUSEA SEVERITY: How bad is the nausea? (e.g., mild, moderate, severe; dehydration, weight loss)     Denies currently  2. ONSET: When did the nausea begin?     Not sure 3. VOMITING: Any vomiting? If Yes, ask: How many times today?     denies 4. RECURRENT SYMPTOM: Have you had nausea before? If Yes, ask: When was the last time? What happened that time?     na 5. CAUSE: What do you think is causing the nausea? Medication Doxepin   Protocols used: Nausea-A-AH

## 2024-08-01 NOTE — Telephone Encounter (Signed)
 FYI Only or Action Required?: Action required by provider: update on patient condition.  Patient was last seen in primary care on 05/25/2024 by Rothfuss, Lang DASEN, PA-C.  Called Nurse Triage reporting Nausea.  Symptoms began several weeks ago.  Interventions attempted: Nothing.  Symptoms are: na.  Triage Disposition: Call PCP Within 24 Hours  Patient/caregiver understands and will follow disposition?: Unsure

## 2024-08-02 DIAGNOSIS — M25552 Pain in left hip: Secondary | ICD-10-CM | POA: Diagnosis not present

## 2024-08-02 DIAGNOSIS — M25551 Pain in right hip: Secondary | ICD-10-CM | POA: Diagnosis not present

## 2024-08-02 DIAGNOSIS — M7061 Trochanteric bursitis, right hip: Secondary | ICD-10-CM | POA: Diagnosis not present

## 2024-08-02 DIAGNOSIS — M7062 Trochanteric bursitis, left hip: Secondary | ICD-10-CM | POA: Diagnosis not present

## 2024-08-14 DIAGNOSIS — J441 Chronic obstructive pulmonary disease with (acute) exacerbation: Secondary | ICD-10-CM | POA: Diagnosis not present

## 2024-08-19 DIAGNOSIS — M47816 Spondylosis without myelopathy or radiculopathy, lumbar region: Secondary | ICD-10-CM | POA: Diagnosis not present

## 2024-08-19 DIAGNOSIS — G894 Chronic pain syndrome: Secondary | ICD-10-CM | POA: Diagnosis not present

## 2024-08-22 ENCOUNTER — Encounter: Payer: Self-pay | Admitting: Radiology

## 2024-08-24 ENCOUNTER — Ambulatory Visit (HOSPITAL_BASED_OUTPATIENT_CLINIC_OR_DEPARTMENT_OTHER)

## 2024-08-25 ENCOUNTER — Ambulatory Visit (HOSPITAL_BASED_OUTPATIENT_CLINIC_OR_DEPARTMENT_OTHER): Admitting: Student

## 2024-08-26 ENCOUNTER — Other Ambulatory Visit (HOSPITAL_BASED_OUTPATIENT_CLINIC_OR_DEPARTMENT_OTHER): Payer: Self-pay | Admitting: Student

## 2024-08-26 DIAGNOSIS — G8929 Other chronic pain: Secondary | ICD-10-CM

## 2024-08-29 ENCOUNTER — Ambulatory Visit (INDEPENDENT_AMBULATORY_CARE_PROVIDER_SITE_OTHER): Admitting: Student

## 2024-08-29 ENCOUNTER — Encounter (HOSPITAL_BASED_OUTPATIENT_CLINIC_OR_DEPARTMENT_OTHER): Payer: Self-pay | Admitting: Student

## 2024-08-29 ENCOUNTER — Telehealth (HOSPITAL_BASED_OUTPATIENT_CLINIC_OR_DEPARTMENT_OTHER): Payer: Self-pay | Admitting: *Deleted

## 2024-08-29 VITALS — BP 160/101 | HR 79 | Temp 98.2°F | Resp 16 | Ht 71.0 in | Wt 195.6 lb

## 2024-08-29 DIAGNOSIS — G8929 Other chronic pain: Secondary | ICD-10-CM | POA: Diagnosis not present

## 2024-08-29 DIAGNOSIS — J441 Chronic obstructive pulmonary disease with (acute) exacerbation: Secondary | ICD-10-CM

## 2024-08-29 DIAGNOSIS — M545 Low back pain, unspecified: Secondary | ICD-10-CM

## 2024-08-29 DIAGNOSIS — J302 Other seasonal allergic rhinitis: Secondary | ICD-10-CM | POA: Diagnosis not present

## 2024-08-29 DIAGNOSIS — N529 Male erectile dysfunction, unspecified: Secondary | ICD-10-CM

## 2024-08-29 DIAGNOSIS — E559 Vitamin D deficiency, unspecified: Secondary | ICD-10-CM

## 2024-08-29 MED ORDER — TADALAFIL 5 MG PO TABS: 5.0000 mg | ORAL_TABLET | Freq: Every day | ORAL | 1 refills | Status: AC | PRN

## 2024-08-29 MED ORDER — VITAMIN D3 50 MCG (2000 UT) PO CAPS: 2000.0000 [IU] | ORAL_CAPSULE | Freq: Every day | ORAL | 12 refills | Status: AC

## 2024-08-29 MED ORDER — FLUTICASONE PROPIONATE 50 MCG/ACT NA SUSP: 2.0000 | Freq: Every day | NASAL | 5 refills | Status: AC

## 2024-08-29 MED ORDER — PREGABALIN 25 MG PO CAPS: 25.0000 mg | ORAL_CAPSULE | Freq: Two times a day (BID) | ORAL | 1 refills | Status: DC

## 2024-08-29 NOTE — Progress Notes (Unsigned)
 Established Patient Office Visit  Subjective   Patient ID: Justin [name], male    DOB: Oct 19, 1956  Age: 68 y.o. MRN: [id number]  Chief Complaint  Patient presents with   Medical Management of Chronic Issues    Follow up. Requesting lyrica rx for insomnia. Dayvigo was $80 for a month supply. It makes him feel cloudy the next day. Trelegy is $180.00.     Puncture Wound    Has  a piece of glass on right pointer finger. Needs help taking it out. Cannot see it.    Weight Loss    Would like to discuss weight loss injections.    HPI  Discussed the use of AI scribe software for clinical note transcription with the patient, who gave verbal consent to proceed.  History of Present Illness   Justin [name] is a 68 year old male with COPD and insomnia who presents for medication management and follow-up.  He has ongoing issues with COPD and is currently prescribed Trelegy, which he finds effective in opening his lungs. The cost of approximately $200 per month is burdensome despite having good insurance. He received a free sample from his pulmonologist but has not been offered an alternative medication. He continues to smoke.  He experiences insomnia and has tried various medications without success. Dayvigo was expensive and ineffective, leaving him feeling worse the next day. He desires to sleep for six hours but finds it challenging. He is currently taking gabapentin  but is considering switching to Lyrica to address both pain and sleep. He has a history of trying multiple medications for sleep, including Seroquel, without success.  He has a history of hernias, having had approximately twelve to fourteen hernias on one side or the other. His current groin hernia has been present for about a year and recently caused significant pain, although the pain has since subsided. Previous hernias were repaired with mesh, but they tend to recur.  He is concerned about weight gain, particularly in his  belly, and denies alcohol consumption. He attributes the weight gain to overeating. He mentions a history of high blood pressure, which was noted to be slightly elevated during a previous visit, but he does not consistently monitor it.  He reports taking hydrocodone  and is interested in trying Lyrica for both pain and sleep issues. He is also taking Cialis , which he finds beneficial for bladder and prostate health, and vitamin D supplements.      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   Essential hypertension 10/28/2023   Smoking 10/28/2023   Chronic, continuous use of opioids 07/08/2023   COPD, frequent exacerbations (HCC) 04/08/2023   Elevated blood pressure reading in office without diagnosis of hypertension 04/08/2023   Tobacco use 03/12/2023   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 08/12/2019   Small bowel obstruction (HCC) 04/04/2016   Seborrheic dermatitis of scalp 05/09/2014   Skin lesion of hand 10/04/2013   IBS (irritable bowel syndrome) 12/20/2012  Chronic pain 08/25/2012   Chronic foot pain 03/10/2012   Anxiety 07/05/2011   BACK PAIN, CHRONIC 10/29/2009   INSOMNIA, CHRONIC 04/27/2009   ADENOCARCINOMA, COLON 01/04/2009   GLUCOSE INTOLERANCE 01/04/2009   Dyslipidemia 11/13/2008   Depression with anxiety 11/13/2008   OVERACTIVE BLADDER 11/13/2008   BENIGN PROSTATIC HYPERTROPHY 11/13/2008   Multiple fractures 08/20/2008   Colon  cancer (HCC) 10/20/2001   Past Medical History:  Diagnosis Date   ADENOCARCINOMA, COLON 01/04/2009   Qualifier: Diagnosis of   By: Tanda MD, Jay      Had follow up colo 2014 and 5 year follow up was recommended  Replacing diagnoses that were inactivated after the 01/19/23 regulatory import     Anxiety    takes approximately 10 diazepam /month-contracts to no more than that amount   Aortic atherosclerosis 01/26/2024   BACK PAIN, CHRONIC 10/29/2009   Qualifier: Diagnosis of   By: Georgian ROSALEA CHARM Lamar     Replacing diagnoses that were inactivated after the 01/19/23 regulatory import     CAD (coronary artery disease)    calcium score 1410   Chronic foot pain 03/10/2012   Chronic pain 08/25/2012   Chronic, continuous use of opioids 07/08/2023   Colon cancer (HCC) 10/20/2001   specialist due for repeat colonoscopy in 2012   Contusion of chest 10/28/2023   COPD, frequent exacerbations (HCC) 04/08/2023   Costal chondritis 10/28/2023   Crohn's disease with complication (HCC) 04/18/2024   Depression with anxiety 11/13/2008   Qualifier: Diagnosis of   By: Tanda MD, LauraLee         Dyslipidemia 11/13/2008   Qualifier: Diagnosis of   By: Tanda MD, LauraLee         Elevated blood pressure reading in office without diagnosis of hypertension 04/08/2023   Elevated coronary artery calcium score 01/26/2024   Erectile dysfunction 04/18/2024   Essential hypertension 10/28/2023   Gastroesophageal reflux disease without esophagitis 01/26/2024   GLUCOSE INTOLERANCE 01/04/2009   Qualifier: Diagnosis of   By: Tanda MD, LauraLee         Hiatal hernia 01/26/2024   History of kidney stones    Hyperlipidemia    IBS (irritable bowel syndrome) 12/20/2012   Insomnia    INSOMNIA, CHRONIC 04/27/2009   Qualifier: Diagnosis of   By: Georgian ROSALEA CHARM Lamar        Multiple fractures 08/20/2008   patient fell off a rood about 13 feet onto concrete-multiple fractures including vertrebral   Noncompliance 01/26/2024    OVERACTIVE BLADDER 11/13/2008   Qualifier: Diagnosis of   By: Tanda MD, LauraLee         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 08/12/2019   Added automatically from request for surgery [id number]     Seasonal allergies 04/18/2024   Seborrheic dermatitis of scalp 05/09/2014   Skin lesion of hand 10/04/2013   Sleep disturbances 01/26/2024   Small bowel obstruction (HCC) 04/04/2016   Tobacco use 03/12/2023   Social History   Tobacco Use   Smoking status: Some Days    Current packs/day: 1.00    Average packs/day: 1 pack/day for 45.9 years (45.9 ttl pk-yrs)    Types: Cigarettes    Start date: 1980    Passive exposure: Current   Smokeless tobacco: Never   Tobacco comments:    Still smoking wants to stop but at moment can't stop mmp   Vaping Use   Vaping status: Never Used  Substance Use Topics   Alcohol use: No  Drug use: No   No Known Allergies    ROS Per HPI.    Objective:     BP (!) 160/101   Pulse 79   Temp 98.2 F (36.8 C) (Oral)   Resp 16   Ht 5' 11 (1.803 m)   Wt 195 lb 9.6 oz (88.7 kg)   SpO2 94%   BMI 27.28 kg/m  BP Readings from Last 3 Encounters:  08/31/24 (!) 160/101  08/29/24 (!) 160/101  07/08/24 (!) 133/90   Wt Readings from Last 3 Encounters:  08/31/24 195 lb (88.5 kg)  08/29/24 195 lb 9.6 oz (88.7 kg)  07/08/24 187 lb 9.6 oz (85.1 kg)   SpO2 Readings from Last 3 Encounters:  08/29/24 94%  07/08/24 95%  05/25/24 95%      Physical Exam Constitutional:      General: He is not in acute distress.    Appearance: Normal appearance. He is not ill-appearing.  HENT:     Head: Normocephalic and atraumatic.     Right Ear: External ear normal.     Left Ear: External ear normal.     Nose: Nose normal.  Eyes:     Conjunctiva/sclera: Conjunctivae normal.  Cardiovascular:     Rate and Rhythm: Normal rate and regular rhythm.     Pulses: Normal pulses.     Heart sounds: Normal heart sounds. No murmur heard.    No friction  rub.  Pulmonary:     Effort: Pulmonary effort is normal. No respiratory distress.     Breath sounds: Normal breath sounds. No wheezing, rhonchi or rales.  Skin:    General: Skin is warm and dry.     Coloration: Skin is not jaundiced or pale.  Neurological:     Mental Status: He is alert.  Psychiatric:        Mood and Affect: Mood normal.        Behavior: Behavior normal.      No results found for any visits on 08/29/24.  Last CBC Lab Results  Component Value Date   WBC 6.6 01/26/2024   HGB 14.6 01/26/2024   HCT 43.7 01/26/2024   MCV 95 01/26/2024   MCH 31.7 01/26/2024   RDW 11.7 01/26/2024   PLT 203 01/26/2024   Last metabolic panel Lab Results  Component Value Date   GLUCOSE 89 04/29/2024   NA 140 04/29/2024   K 4.2 04/29/2024   CL 98 04/29/2024   CO2 20 04/29/2024   BUN 14 04/29/2024   CREATININE 1.02 04/29/2024   EGFR 80 04/29/2024   CALCIUM 9.7 04/29/2024   PROT 6.8 04/29/2024   ALBUMIN 4.4 04/29/2024   LABGLOB 2.4 04/29/2024   AGRATIO 1.2 03/24/2023   BILITOT 0.5 04/29/2024   ALKPHOS 95 04/29/2024   AST 25 04/29/2024   ALT 16 04/29/2024   ANIONGAP 8 03/13/2023   Last lipids Lab Results  Component Value Date   CHOL 162 04/29/2024   HDL 51 04/29/2024   LDLCALC 89 04/29/2024   TRIG 122 04/29/2024   CHOLHDL 3.2 04/29/2024   Last hemoglobin A1c Lab Results  Component Value Date   HGBA1C 5.7 (H) 01/26/2024      The 10-year ASCVD risk score (Arnett DK, et al., 2019) is: 30.3%    Assessment & Plan:   Assessment and Plan    Chronic obstructive pulmonary disease (COPD) COPD management with Trelegy is effective but cost-prohibitive at $200/month. Pulmonologist advised smoking cessation. No alternative inhaler prescribed due to similar cost. -  Contact pulmonologist to discuss cost of Trelegy and explore alternatives. - Continue smoking cessation efforts.  Insomnia and chronic pain management (low back pain, hip pain) Insomnia and chronic pain  managed with gabapentin . Interested in Lyrica for pain and sleep. Dayvigo was ineffective and caused adverse effects. Valium  not prescribed due to addiction risk. Seroquel considered if Lyrica is ineffective. - Discontinued gabapentin . - Initiated Lyrica 25 mg twice daily. - Will consider Seroquel if Lyrica is ineffective. - Avoid Valium  due to addiction risk.  Male erectile dysfunction Managed with Cialis . No nitroglycerin  use, but advised to discuss with cardiologist due to potential interaction. Aware of risk of hypotension with concurrent nitroglycerin  use- patient agrees to this risk. - Continue Cialis  as needed. - Discuss Cialis  use with cardiologist, especially regarding nitroglycerin  use.  I personally spent a total of 35 minutes in the care of the patient today including preparing to see the patient, getting/reviewing separately obtained history, performing a medically appropriate exam/evaluation, counseling and educating, placing orders, and documenting clinical information in the EHR.   Return in about 6 months (around 02/26/2025) for Chronic Followup.    Blinda Turek T Jeorge Reister, PA-C

## 2024-08-29 NOTE — Patient Instructions (Signed)
 It was nice to see you today!  If you have any problems before your next visit feel free to message me via MyChart (minor issues or questions) or call the office, otherwise you may reach out to schedule an office visit.  Thank you! Pau Banh, PA-C

## 2024-08-29 NOTE — Telephone Encounter (Signed)
 Copied from CRM [id number]. Topic: Clinical - Medication Prior Auth >> Aug 29, 2024  4:36 PM Jasmin G wrote: Reason for CRM: Pt's preferred pharmacy, [id number] - [place], [place] - [address], [place] [address] needs prior authorization to refill pregabalin (LYRICA) 25 MG. Pt requested for pre authorization to be sent today if possible.

## 2024-08-30 ENCOUNTER — Other Ambulatory Visit (HOSPITAL_COMMUNITY): Payer: Self-pay

## 2024-08-30 ENCOUNTER — Telehealth (HOSPITAL_BASED_OUTPATIENT_CLINIC_OR_DEPARTMENT_OTHER): Payer: Self-pay | Admitting: Pharmacy Technician

## 2024-08-30 NOTE — Telephone Encounter (Signed)
 Pharmacy Patient Advocate Encounter  Received notification from [facility] that Prior Authorization for Pregabalin 25MG  capsules has been DENIED.  Full denial letter will be uploaded to the media tab. See denial reason below.   PA #/Case ID/Reference #: [id number]

## 2024-08-30 NOTE — Telephone Encounter (Signed)
 Pt advised lyrica was denied. Pt said he has a lot of back pain and figured it could get a approved. Can we send this to the Rx Team Appeals? See below.

## 2024-08-30 NOTE — Telephone Encounter (Signed)
 Pharmacy Patient Advocate Encounter   Received notification from Pt Calls Messages that prior authorization for Pregabalin 25MG  capsules is required/requested.   Insurance verification completed.   The patient is insured through [employer].   Per test claim: PA required; PA submitted to above mentioned insurance via Latent Key/confirmation #/EOC [id number] Status is pending

## 2024-08-30 NOTE — Telephone Encounter (Signed)
 PA request has been Started. New Encounter has been or will be created for follow up. For additional info see Pharmacy Prior Auth telephone encounter from 08/30/2024.

## 2024-08-31 ENCOUNTER — Encounter (HOSPITAL_BASED_OUTPATIENT_CLINIC_OR_DEPARTMENT_OTHER): Payer: Self-pay

## 2024-08-31 ENCOUNTER — Other Ambulatory Visit (HOSPITAL_COMMUNITY): Payer: Self-pay

## 2024-08-31 ENCOUNTER — Ambulatory Visit (HOSPITAL_BASED_OUTPATIENT_CLINIC_OR_DEPARTMENT_OTHER)

## 2024-08-31 VITALS — BP 160/101 | Ht 71.0 in | Wt 195.0 lb

## 2024-08-31 DIAGNOSIS — Z0000 Encounter for general adult medical examination without abnormal findings: Secondary | ICD-10-CM

## 2024-08-31 NOTE — Progress Notes (Unsigned)
 I connected with  Justin [name] on 08/31/24 by a video and audio enabled telemedicine application and verified that I am speaking with the correct person using two identifiers.  Patient Location: Home  Provider Location: Home Office  Persons Participating in Visit: Patient.  I discussed the limitations of evaluation and management by telemedicine. The patient expressed understanding and agreed to proceed.  Vital Signs: Because this visit was a virtual/telehealth visit, some criteria may be missing or patient reported. Any vitals not documented were not able to be obtained and vitals that have been documented are patient reported.   Because this visit was a virtual/telehealth visit,  certain criteria was not obtained, such a blood pressure, CBG if applicable, and timed get up and go. Any medications not marked as taking were not mentioned during the medication reconciliation part of the visit. Any vitals not documented were not able to be obtained due to this being a telehealth visit or patient was unable to self-report a recent blood pressure reading due to a lack of equipment at home via telehealth. Vitals that have been documented are verbally provided by the patient.  This visit was performed by a medical professional under my direct supervision. I was immediately available for consultation/collaboration. I have reviewed and agree with the Annual Wellness Visit documentation.  Chief Complaint  Patient presents with   Medicare Wellness     Subjective:   Justin [name] is a 68 y.o. male who presents for a Medicare Annual Wellness Visit.  Allergies (verified) Patient has no known allergies.   History: Past Medical History:  Diagnosis Date   ADENOCARCINOMA, COLON 01/04/2009   Qualifier: Diagnosis of   By: Tanda MD, Jay      Had follow up colo 2014 and 5 year follow up was recommended  Replacing diagnoses that were inactivated after the 01/19/23 regulatory import      Anxiety    takes approximately 10 diazepam /month-contracts to no more than that amount   Aortic atherosclerosis 01/26/2024   BACK PAIN, CHRONIC 10/29/2009   Qualifier: Diagnosis of   By: Georgian ROSALEA CHARM Lamar     Replacing diagnoses that were inactivated after the 01/19/23 regulatory import     CAD (coronary artery disease)    calcium score 1410   Chronic foot pain 03/10/2012   Chronic pain 08/25/2012   Chronic, continuous use of opioids 07/08/2023   Colon cancer (HCC) 10/20/2001   specialist due for repeat colonoscopy in 2012   Contusion of chest 10/28/2023   COPD, frequent exacerbations (HCC) 04/08/2023   Costal chondritis 10/28/2023   Crohn's disease with complication (HCC) 04/18/2024   Depression with anxiety 11/13/2008   Qualifier: Diagnosis of   By: Tanda MD, LauraLee         Dyslipidemia 11/13/2008   Qualifier: Diagnosis of   By: Tanda MD, LauraLee         Elevated blood pressure reading in office without diagnosis of hypertension 04/08/2023   Elevated coronary artery calcium score 01/26/2024   Erectile dysfunction 04/18/2024   Essential hypertension 10/28/2023   Gastroesophageal reflux disease without esophagitis 01/26/2024   GLUCOSE INTOLERANCE 01/04/2009   Qualifier: Diagnosis of   By: Tanda MD, LauraLee         Hiatal hernia 01/26/2024   History of kidney stones    Hyperlipidemia    IBS (irritable bowel syndrome) 12/20/2012   Insomnia    INSOMNIA, CHRONIC 04/27/2009   Qualifier: Diagnosis of   By: Georgian ROSALEA CHARM Lamar  Multiple fractures 08/20/2008   patient fell off a rood about 13 feet onto concrete-multiple fractures including vertrebral   Noncompliance 01/26/2024   OVERACTIVE BLADDER 11/13/2008   Qualifier: Diagnosis of   By: Tanda MD, LauraLee         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 08/12/2019   Added automatically from request for surgery [id number]     Seasonal allergies 04/18/2024   Seborrheic dermatitis of scalp 05/09/2014   Skin  lesion of hand 10/04/2013   Sleep disturbances 01/26/2024   Small bowel obstruction (HCC) 04/04/2016   Tobacco use 03/12/2023   Past Surgical History:  Procedure Laterality Date   COLECTOMY  02/17/2002   sigmoid colectomy   ELBOW SURGERY     HAND SURGERY     right hand   HERNIA REPAIR     HIP FRACTURE SURGERY     Steel plate     left foot   steel plate      right elbow   Family History  Problem Relation Age of Onset   Coronary artery disease Father    Other Neg Hx        no FH od colon cancer   Social History   Occupational History   Not on file  Tobacco Use   Smoking status: Some Days    Current packs/day: 1.00    Average packs/day: 1 pack/day for 45.9 years (45.9 ttl pk-yrs)    Types: Cigarettes    Start date: 1980    Passive exposure: Current   Smokeless tobacco: Never   Tobacco comments:    Still smoking wants to stop but at moment can't stop mmp   Vaping Use   Vaping status: Never Used  Substance and Sexual Activity   Alcohol use: No   Drug use: No   Sexual activity: Yes   Tobacco Counseling Ready to quit: Not Answered Counseling given: Not Answered Tobacco comments: Still smoking wants to stop but at moment can't stop mmp   SDOH Screenings   Food Insecurity: No Food Insecurity (08/31/2024)  Housing: Low Risk  (08/31/2024)  Transportation Needs: No Transportation Needs (08/31/2024)  Utilities: Not At Risk (08/31/2024)  Alcohol Screen: Low Risk  (08/18/2023)  Depression (PHQ2-9): Low Risk  (08/31/2024)  Recent Concern: Depression (PHQ2-9) - Medium Risk (08/29/2024)  Financial Resource Strain: Low Risk  (08/18/2023)  Physical Activity: Sufficiently Active (08/31/2024)  Social Connections: Socially Isolated (08/31/2024)  Stress: No Stress Concern Present (08/31/2024)  Tobacco Use: High Risk (08/31/2024)  Health Literacy: Adequate Health Literacy (08/31/2024)   Depression Screen    08/31/2024    9:03 AM 08/29/2024    3:20 PM 05/25/2024   10:41 AM  01/26/2024    8:33 AM 08/18/2023    8:36 AM 07/08/2023    8:56 AM 03/24/2023   10:17 AM  PHQ 2/9 Scores  PHQ - 2 Score 0 0 0 0 0 0 1  PHQ- 9 Score 4 5 3  3    8       Data saved with a previous flowsheet row definition     Goals Addressed             This Visit's Progress    Patient Stated       Lose some weight        Visit info / Clinical Intake: Medicare Wellness Visit Type:: Subsequent Annual Wellness Visit Persons participating in visit:: patient Medicare Wellness Visit Mode:: Video If telephone:: -- (patient was on video) Because this  visit was a virtual/telehealth visit:: pt reported vitals If Telephone or Video please confirm:: I connected with the patient using audio enabled telemedicine application and verified that I am speaking with the correct person using two identifiers; I discussed the limitations of evaluation and management by telemedicine; The patient expressed understanding and agreed to proceed Patient Location:: home Provider Location:: home office Information given by:: patient Interpreter Needed?: No Pre-visit prep was completed: yes AWV questionnaire completed by patient prior to visit?: no Living arrangements:: (!) lives alone Patient's Overall Health Status Rating: very good Typical amount of pain: some Does pain affect daily life?: no Are you currently prescribed opioids?: (!) yes  Dietary Habits and Nutritional Risks How many meals a day?: 2 Eats fruit and vegetables daily?: yes Most meals are obtained by: preparing own meals; eating out In the last 2 weeks, have you had any of the following?: none Diabetic:: no  Functional Status Activities of Daily Living (to include ambulation/medication): Independent Ambulation: Independent Medication Administration: Independent Home Management: Independent Manage your own finances?: yes Primary transportation is: driving Concerns about vision?: no *vision screening is required for WTM* Concerns  about hearing?: no  Fall Screening Falls in the past year?: 0 Number of falls in past year: 0 Was there an injury with Fall?: 0 Fall Risk Category Calculator: 0 Patient Fall Risk Level: Low Fall Risk  Fall Risk Patient at Risk for Falls Due to: No Fall Risks Fall risk Follow up: Falls evaluation completed; Falls prevention discussed; Education provided  Home and Transportation Safety: All rugs have non-skid backing?: N/A, no rugs All stairs or steps have railings?: N/A, no stairs Grab bars in the bathtub or shower?: (!) no Have non-skid surface in bathtub or shower?: (!) no Good home lighting?: yes Regular seat belt use?: yes Hospital stays in the last year:: no  Cognitive Assessment Difficulty concentrating, remembering, or making decisions? : no Will 6CIT or Mini Cog be Completed: no 6CIT or Mini Cog Declined: patient alert, oriented, able to answer questions appropriately and recall recent events  Advance Directives (For Healthcare) Does Patient Have a Medical Advance Directive?: No Would patient like information on creating a medical advance directive?: No - Patient declined  Reviewed/Updated  Reviewed/Updated: Reviewed All (Medical, Surgical, Family, Medications, Allergies, Care Teams, Patient Goals)        Objective:    Today's Vitals   08/31/24 0857  BP: (!) 160/101  Weight: 195 lb (88.5 kg)  Height: 5' 11 (1.803 m)   Body mass index is 27.2 kg/m.  Current Medications (verified) Outpatient Encounter Medications as of 08/31/2024  Medication Sig   albuterol  (VENTOLIN  HFA) 108 (90 Base) MCG/ACT inhaler Inhale 2 puffs into the lungs every 4 (four) hours as needed for wheezing or shortness of breath.   Artificial Tear Ointment (DRY EYES OP) Apply to eye as needed.   buPROPion  (WELLBUTRIN  SR) 150 MG 12 hr tablet Take 1 tablet (150 mg total) by mouth daily for 7 days, THEN 1 tablet (150 mg total) 2 (two) times daily.   Cholecalciferol (VITAMIN D3) 50 MCG (2000  UT) capsule Take 1 capsule (2,000 Units total) by mouth daily.   fluticasone  (FLONASE ) 50 MCG/ACT nasal spray Place 2 sprays into both nostrils daily.   furosemide (LASIX) 40 MG tablet Take 40 mg by mouth daily.   HYDROcodone -acetaminophen  (NORCO) 10-325 MG tablet Take 1 tablet by mouth in the morning, at noon, in the evening, and at bedtime.   loratadine (CLARITIN) 10 MG tablet Take 10  mg by mouth daily.   meloxicam  (MOBIC ) 15 MG tablet Take 15 mg by mouth as needed.   montelukast  (SINGULAIR ) 10 MG tablet Take 1 tablet (10 mg total) by mouth at bedtime.   nitroGLYCERIN  (NITROSTAT ) 0.4 MG SL tablet Place 1 tablet (0.4 mg total) under the tongue every 5 (five) minutes as needed for chest pain.   pantoprazole  (PROTONIX ) 40 MG tablet Take 1 tablet (40 mg total) by mouth daily.   potassium chloride  (MICRO-K ) 10 MEQ CR capsule Take 10 mEq by mouth 2 (two) times daily.   pregabalin (LYRICA) 25 MG capsule Take 1 capsule (25 mg total) by mouth 2 (two) times daily.   rosuvastatin (CRESTOR) 10 MG tablet Take 10 mg by mouth at bedtime.   tadalafil  (CIALIS ) 5 MG tablet Take 1 tablet (5 mg total) by mouth daily as needed for erectile dysfunction.   tamsulosin (FLOMAX) 0.4 MG CAPS capsule Take 0.4 mg by mouth as needed.   tiZANidine (ZANAFLEX) 4 MG tablet Take 4 mg by mouth as needed.   Vitamin D, Ergocalciferol, (DRISDOL) 1.25 MG (50000 UNIT) CAPS capsule Take 50,000 Units by mouth once a week.   aspirin EC 81 MG tablet Take 81 mg by mouth daily. Swallow whole. (Patient not taking: Reported on 08/31/2024)   Fluticasone -Umeclidin-Vilant (TRELEGY ELLIPTA ) 200-62.5-25 MCG/ACT AEPB Inhale 1 puff into the lungs daily. (Patient not taking: Reported on 08/31/2024)   ipratropium-albuterol  (DUONEB) 0.5-2.5 (3) MG/3ML SOLN Inhale 3 mLs into the lungs every 12 (twelve) hours. (Patient not taking: Reported on 08/31/2024)   Lemborexant (DAYVIGO) 5 MG TABS Take one tablet (5 mg) by mouth once daily, as needed, at bedtime  with at least 7 hours before planned time of awakening; may increase to 10 mg based on response and tolerability. (Patient not taking: Reported on 08/31/2024)   lidocaine  (LIDODERM ) 5 % 12 HRS ON 12 HRS OFF (Patient not taking: Reported on 08/31/2024)   No facility-administered encounter medications on file as of 08/31/2024.   Hearing/Vision screen Hearing Screening - Comments:: Patient has no difficulties Vision Screening - Comments:: Patient has no difficulties  Immunizations and Health Maintenance Health Maintenance  Topic Date Due   Zoster Vaccines- Shingrix (1 of 2) 10/18/1973   Lung Cancer Screening  11/22/2005   COVID-19 Vaccine (1) 09/14/2024 (Originally 10/19/1961)   Medicare Annual Wellness (AWV)  08/31/2025   Colonoscopy  02/23/2033   DTaP/Tdap/Td (3 - Td or Tdap) 08/17/2033   Pneumococcal Vaccine: 50+ Years  Completed   Influenza Vaccine  Completed   Hepatitis C Screening  Completed   Meningococcal B Vaccine  Aged Out        Assessment/Plan:  This is a routine wellness examination for Justin [name].  Patient Care Team: Rothfuss, Jacob T, PA-C as PCP - General (Physician Assistant) Bernie Lamar PARAS, MD as PCP - Cardiology (Cardiology) Gable Cambric, MD as Referring Physician (Internal Medicine) Carlin Delon BROCKS, NP as Nurse Practitioner (Cardiology)  I have personally reviewed and noted the following in the patient's chart:   Medical and social history Use of alcohol, tobacco or illicit drugs  Current medications and supplements including opioid prescriptions. Functional ability and status Nutritional status Physical activity Advanced directives List of other physicians Hospitalizations, surgeries, and ER visits in previous 12 months Vitals Screenings to include cognitive, depression, and falls Referrals and appointments  No orders of the defined types were placed in this encounter.  In addition, I have reviewed and discussed with patient certain preventive  protocols, quality metrics, and best  practice recommendations. A written personalized care plan for preventive services as well as general preventive health recommendations were provided to patient.   Lyle MARLA Right, CMA   08/31/2024   Return in 1 year (on 08/31/2025).  After Visit Summary: (MyChart) Due to this being a telephonic visit, the after visit summary with patients personalized plan was offered to patient via MyChart   Nurse Notes: Nothing to report

## 2024-08-31 NOTE — Patient Instructions (Signed)
 Justin [name],  Thank you for taking the time for your Medicare Wellness Visit. I appreciate your continued commitment to your health goals. Please review the care plan we discussed, and feel free to reach out if I can assist you further.  Please note that Annual Wellness Visits do not include a physical exam. Some assessments may be limited, especially if the visit was conducted virtually. If needed, we may recommend an in-person follow-up with your provider.  Ongoing Care Seeing your primary care provider every 3 to 6 months helps us  monitor your health and provide consistent, personalized care.   Referrals If a referral was made during today's visit and you haven't received any updates within two weeks, please contact the referred provider directly to check on the status.  Recommended Screenings:  Health Maintenance  Topic Date Due   Zoster (Shingles) Vaccine (1 of 2) 10/18/1973   Screening for Lung Cancer  11/22/2005   Medicare Annual Wellness Visit  08/17/2024   COVID-19 Vaccine (1) 09/14/2024*   Colon Cancer Screening  02/23/2033   DTaP/Tdap/Td vaccine (3 - Td or Tdap) 08/17/2033   Pneumococcal Vaccine for age over 81  Completed   Flu Shot  Completed   Hepatitis C Screening  Completed   Meningitis B Vaccine  Aged Out  *Topic was postponed. The date shown is not the original due date.       08/31/2024    9:00 AM  Advanced Directives  Does Patient Have a Medical Advance Directive? No  Would patient like information on creating a medical advance directive? No - Patient declined    Vision: Annual vision screenings are recommended for early detection of glaucoma, cataracts, and diabetic retinopathy. These exams can also reveal signs of chronic conditions such as diabetes and high blood pressure.  Dental: Annual dental screenings help detect early signs of oral cancer, gum disease, and other conditions linked to overall health, including heart disease and diabetes.  Please see  the attached documents for additional preventive care recommendations.

## 2024-09-02 DIAGNOSIS — Z79891 Long term (current) use of opiate analgesic: Secondary | ICD-10-CM | POA: Diagnosis not present

## 2024-09-02 DIAGNOSIS — M47816 Spondylosis without myelopathy or radiculopathy, lumbar region: Secondary | ICD-10-CM | POA: Diagnosis not present

## 2024-09-02 DIAGNOSIS — M1611 Unilateral primary osteoarthritis, right hip: Secondary | ICD-10-CM | POA: Diagnosis not present

## 2024-09-05 NOTE — Telephone Encounter (Signed)
 Pt advised and voices understanding.  Pt said it was $24.00 cash price. He paid it. He said he can afford it.

## 2024-09-14 DIAGNOSIS — J441 Chronic obstructive pulmonary disease with (acute) exacerbation: Secondary | ICD-10-CM | POA: Diagnosis not present

## 2024-09-16 DIAGNOSIS — G894 Chronic pain syndrome: Secondary | ICD-10-CM | POA: Diagnosis not present

## 2024-09-16 DIAGNOSIS — M47816 Spondylosis without myelopathy or radiculopathy, lumbar region: Secondary | ICD-10-CM | POA: Diagnosis not present

## 2024-09-26 ENCOUNTER — Ambulatory Visit

## 2024-09-26 ENCOUNTER — Other Ambulatory Visit (HOSPITAL_BASED_OUTPATIENT_CLINIC_OR_DEPARTMENT_OTHER): Payer: Self-pay

## 2024-09-26 VITALS — BP 114/66 | HR 73 | Ht 70.25 in | Wt 198.0 lb

## 2024-09-26 DIAGNOSIS — J441 Chronic obstructive pulmonary disease with (acute) exacerbation: Secondary | ICD-10-CM | POA: Diagnosis not present

## 2024-09-26 DIAGNOSIS — Z23 Encounter for immunization: Secondary | ICD-10-CM

## 2024-09-26 DIAGNOSIS — F17210 Nicotine dependence, cigarettes, uncomplicated: Secondary | ICD-10-CM | POA: Diagnosis not present

## 2024-09-26 DIAGNOSIS — J9611 Chronic respiratory failure with hypoxia: Secondary | ICD-10-CM

## 2024-09-26 DIAGNOSIS — U071 COVID-19: Secondary | ICD-10-CM

## 2024-09-26 LAB — PULMONARY FUNCTION TEST
DL/VA % pred: 58 %
DL/VA: 2.39 ml/min/mmHg/L
DLCO unc % pred: 49 %
DLCO unc: 12.98 ml/min/mmHg
FEF 25-75 Post: 1.19 L/s
FEF 25-75 Pre: 0.65 L/s
FEF2575-%Change-Post: 81 %
FEF2575-%Pred-Post: 46 %
FEF2575-%Pred-Pre: 25 %
FEV1-%Change-Post: 24 %
FEV1-%Pred-Post: 58 %
FEV1-%Pred-Pre: 46 %
FEV1-Post: 1.94 L
FEV1-Pre: 1.56 L
FEV1FVC-%Change-Post: 7 %
FEV1FVC-%Pred-Pre: 72 %
FEV6-%Change-Post: 16 %
FEV6-%Pred-Post: 77 %
FEV6-%Pred-Pre: 66 %
FEV6-Post: 3.29 L
FEV6-Pre: 2.83 L
FEV6FVC-%Change-Post: 0 %
FEV6FVC-%Pred-Post: 102 %
FEV6FVC-%Pred-Pre: 102 %
FVC-%Change-Post: 16 %
FVC-%Pred-Post: 75 %
FVC-%Pred-Pre: 65 %
FVC-Post: 3.4 L
FVC-Pre: 2.93 L
Post FEV1/FVC ratio: 57 %
Post FEV6/FVC ratio: 97 %
Pre FEV1/FVC ratio: 53 %
Pre FEV6/FVC Ratio: 97 %
RV % pred: 136 %
RV: 3.34 L
TLC % pred: 97 %
TLC: 6.93 L

## 2024-09-26 MED ORDER — UMECLIDINIUM-VILANTEROL 62.5-25 MCG/ACT IN AEPB
1.0000 | INHALATION_SPRAY | Freq: Every day | RESPIRATORY_TRACT | 6 refills | Status: AC
  Filled 2024-09-26: qty 60, 60d supply, fill #0

## 2024-09-26 MED ORDER — BREZTRI AEROSPHERE 160-9-4.8 MCG/ACT IN AERO: 2.0000 | INHALATION_SPRAY | Freq: Two times a day (BID) | RESPIRATORY_TRACT | Status: AC

## 2024-09-26 NOTE — Addendum Note (Signed)
 Addended by: Charlena Haub M on: 09/26/2024 03:17 PM   Modules accepted: Orders

## 2024-09-26 NOTE — Progress Notes (Signed)
 Full pft performed today

## 2024-09-26 NOTE — Addendum Note (Signed)
 Addended by: Mellisa Arshad M on: 09/26/2024 03:11 PM   Modules accepted: Orders

## 2024-09-26 NOTE — Progress Notes (Signed)
 Subjective:   PATIENT ID: Justin [name] GENDER: male DOB: Apr 09, 1956, MRN: [id number]   HPI Discussed the use of AI scribe software for clinical note transcription with the patient, who gave verbal consent to proceed.  History of Present Illness Justin [name] is a 68 year old male with chronic obstructive pulmonary disease (COPD) who presents for follow-up regarding his lung function and smoking cessation.  He has not started using Wellbutrin  as he has not fully committed to quitting smoking. He acknowledges the need to quit smoking, especially given his lung condition, but finds it difficult to stop due to enjoyment. 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  He uses albuterol  as needed but finds Trelegy more effective in opening up his lungs, although it is too expensive for regular use. He has not been using Breztri  due to its high cost. He dislikes using the nebulizer machine despite having the medication for it, as he finds it inconvenient to sit and use it.  He owns oxygen  equipment, including a tower and tanks, but does not use them regularly as he does not feel the need. He mentions the impracticality of carrying a tank while working and expresses interest in a designer, jewellery.  His recent pulmonary function test showed his lung function was about half of what is expected, based on his recollection of the results discussed during the visit.  Socially, he continues to work in remodeling, both indoors and outdoors, despite being retired. He does not drink alcohol or engage in other recreational activities, with smoking being his primary vice.     Past Medical History:  Diagnosis Date   ADENOCARCINOMA, COLON 01/04/2009   Qualifier: Diagnosis of   By: Tanda MD, Jay      Had follow up colo 2014 and 5 year follow up was recommended  Replacing  diagnoses that were inactivated after the 01/19/23 regulatory import     Anxiety    takes approximately 10 diazepam /month-contracts to no more than that amount   Aortic atherosclerosis 01/26/2024   BACK PAIN, CHRONIC 10/29/2009   Qualifier: Diagnosis of   By: Georgian ROSALEA CHARM Lamar     Replacing diagnoses that were inactivated after the 01/19/23 regulatory import     CAD (coronary artery disease)    calcium score 1410   Chronic foot pain 03/10/2012   Chronic pain 08/25/2012   Chronic, continuous use of opioids 07/08/2023   Colon cancer (HCC) 10/20/2001   specialist due for repeat colonoscopy in 2012   Contusion of chest 10/28/2023   COPD, frequent exacerbations (HCC) 04/08/2023   Costal chondritis 10/28/2023   Crohn's disease with complication (HCC) 04/18/2024   Depression with anxiety 11/13/2008   Qualifier: Diagnosis of   By: Tanda MD, LauraLee         Dyslipidemia 11/13/2008   Qualifier: Diagnosis of   By: Tanda MD, LauraLee         Elevated blood pressure reading in office without diagnosis of hypertension 04/08/2023   Elevated coronary artery calcium score 01/26/2024   Erectile dysfunction 04/18/2024   Essential hypertension 10/28/2023   Gastroesophageal reflux disease without esophagitis 01/26/2024   GLUCOSE INTOLERANCE 01/04/2009   Qualifier: Diagnosis of   By: Tanda MD, LauraLee         Hiatal hernia 01/26/2024   History of kidney stones    Hyperlipidemia    IBS (irritable  bowel syndrome) 12/20/2012   Insomnia    INSOMNIA, CHRONIC 04/27/2009   Qualifier: Diagnosis of   By: Georgian ROSALEA CHARM Lamar        Multiple fractures 08/20/2008   patient fell off a rood about 13 feet onto concrete-multiple fractures including vertrebral   Noncompliance 01/26/2024   OVERACTIVE BLADDER 11/13/2008   Qualifier: Diagnosis of   By: Tanda MD, LauraLee         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 08/12/2019   Added automatically from request for surgery [id number]     Seasonal  allergies 04/18/2024   Seborrheic dermatitis of scalp 05/09/2014   Skin lesion of hand 10/04/2013   Sleep disturbances 01/26/2024   Small bowel obstruction (HCC) 04/04/2016   Tobacco use 03/12/2023     Family History  Problem Relation Age of Onset   Coronary artery disease Father    Other Neg Hx        no FH od colon cancer     Social History   Socioeconomic History   Marital status: Divorced    Spouse name: Not on file   Number of children: 0   Years of education: Not on file   Highest education level: Not on file  Occupational History   Not on file  Tobacco Use   Smoking status: Every Day    Current packs/day: 1.00    Average packs/day: 1 pack/day for 45.9 years (45.9 ttl pk-yrs)    Types: Cigarettes    Start date: 1980    Passive exposure: Current   Smokeless tobacco: Never   Tobacco comments:    Still smoking wants to stop but at moment can't stop mmp   Vaping Use   Vaping status: Never Used  Substance and Sexual Activity   Alcohol use: No   Drug use: No   Sexual activity: Yes  Other Topics Concern   Not on file  Social History Narrative   RETIRED   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[employer]- see PMHDivorced (but ex-wife is currently living with patient during the temp disability)no children   quit tob 2000 - smoked for 20 yrs Alcohol use-no  (former drinker)     Social Drivers of Corporate Investment Banker Strain: Low Risk  (08/18/2023)   Overall Financial Resource Strain (CARDIA)    Difficulty of Paying Living Expenses: Not hard at all  Food Insecurity: No Food Insecurity (08/31/2024)   Hunger Vital Sign    Worried About Running Out of Food in the Last Year: Never true    Ran Out of Food in the Last Year: Never true  Transportation Needs: No Transportation Needs (08/31/2024)   PRAPARE - Administrator, Civil Service (Medical): No    Lack of Transportation (Non-Medical): No  Physical Activity: Sufficiently Active  (08/31/2024)   Exercise Vital Sign    Days of Exercise per Week: 7 days    Minutes of Exercise per Session: 40 min  Stress: No Stress Concern Present (08/31/2024)   [name] [employer] - Occupational Stress Questionnaire    Feeling of Stress: Only a little  Social Connections: Socially Isolated (08/31/2024)   Social Connection and Isolation Panel    Frequency of Communication with Friends and Family: Three times a week    Frequency of Social Gatherings with Friends and Family: Twice a week    Attends Religious Services: Never    Database Administrator or Organizations: No  Attends Banker Meetings: Never    Marital Status: Divorced  Catering Manager Violence: Not At Risk (08/31/2024)   Humiliation, Afraid, Rape, and Kick questionnaire    Fear of Current or Ex-Partner: No    Emotionally Abused: No    Physically Abused: No    Sexually Abused: No     No Known Allergies   Outpatient Medications Prior to Visit  Medication Sig Dispense Refill   albuterol  (VENTOLIN  HFA) 108 (90 Base) MCG/ACT inhaler Inhale 2 puffs into the lungs every 4 (four) hours as needed for wheezing or shortness of breath. 18 g 5   Artificial Tear Ointment (DRY EYES OP) Apply to eye as needed.     Cholecalciferol (VITAMIN D3) 50 MCG (2000 UT) capsule Take 1 capsule (2,000 Units total) by mouth daily. 30 capsule 12   fluticasone  (FLONASE ) 50 MCG/ACT nasal spray Place 2 sprays into both nostrils daily. 16 g 5   furosemide (LASIX) 40 MG tablet Take 40 mg by mouth daily.     HYDROcodone -acetaminophen  (NORCO) 10-325 MG tablet Take 1 tablet by mouth in the morning, at noon, in the evening, and at bedtime.     loratadine (CLARITIN) 10 MG tablet Take 10 mg by mouth daily.     nitroGLYCERIN  (NITROSTAT ) 0.4 MG SL tablet Place 1 tablet (0.4 mg total) under the tongue every 5 (five) minutes as needed for chest pain. 25 tablet 1   pantoprazole  (PROTONIX ) 40 MG tablet Take 1 tablet (40 mg  total) by mouth daily. 30 tablet 3   potassium chloride  (MICRO-K ) 10 MEQ CR capsule Take 10 mEq by mouth 2 (two) times daily.     pregabalin  (LYRICA ) 25 MG capsule Take 1 capsule (25 mg total) by mouth 2 (two) times daily. 60 capsule 1   rosuvastatin (CRESTOR) 10 MG tablet Take 10 mg by mouth at bedtime.     tadalafil  (CIALIS ) 5 MG tablet Take 1 tablet (5 mg total) by mouth daily as needed for erectile dysfunction. 30 tablet 1   tamsulosin (FLOMAX) 0.4 MG CAPS capsule Take 0.4 mg by mouth as needed.     tiZANidine (ZANAFLEX) 4 MG tablet Take 4 mg by mouth as needed.     Vitamin D, Ergocalciferol, (DRISDOL) 1.25 MG (50000 UNIT) CAPS capsule Take 50,000 Units by mouth once a week.     aspirin EC 81 MG tablet Take 81 mg by mouth daily. Swallow whole. (Patient not taking: Reported on 09/26/2024)     buPROPion  (WELLBUTRIN  SR) 150 MG 12 hr tablet Take 1 tablet (150 mg total) by mouth daily for 7 days, THEN 1 tablet (150 mg total) 2 (two) times daily. (Patient not taking: No sig reported) 187 tablet 0   Fluticasone -Umeclidin-Vilant (TRELEGY ELLIPTA ) 200-62.5-25 MCG/ACT AEPB Inhale 1 puff into the lungs daily. (Patient not taking: Reported on 09/26/2024) 60 each 5   ipratropium-albuterol  (DUONEB) 0.5-2.5 (3) MG/3ML SOLN Inhale 3 mLs into the lungs every 12 (twelve) hours. (Patient not taking: Reported on 09/26/2024) 360 mL 1   Lemborexant  (DAYVIGO ) 5 MG TABS Take one tablet (5 mg) by mouth once daily, as needed, at bedtime with at least 7 hours before planned time of awakening; may increase to 10 mg based on response and tolerability. (Patient not taking: Reported on 09/26/2024) 30 tablet 2   lidocaine  (LIDODERM ) 5 % 12 HRS ON 12 HRS OFF (Patient not taking: Reported on 09/26/2024) 30 patch 0   meloxicam  (MOBIC ) 15 MG tablet Take 15 mg by mouth as needed.  montelukast  (SINGULAIR ) 10 MG tablet Take 1 tablet (10 mg total) by mouth at bedtime. (Patient not taking: Reported on 09/26/2024) 30 tablet 1   No  facility-administered medications prior to visit.    ROS Reviewed all systems and reported negative except as above     Objective:   Vitals:   09/26/24 1441 09/26/24 1446  BP: 114/66   Pulse: 73   SpO2: (!) 87% 94%  Weight: 198 lb (89.8 kg)   Height: 5' 10.25 (1.784 m)     Physical Exam Physical Exam GENERAL: Appropriate to age, no acute distress. HEAD EYES EARS NOSE THROAT: Moist mucous membranes, atraumatic, normocephalic. CHEST: Crackles present on lung exam. CARDIAC: Regular rate and rhythm, normal S1, normal S2, no murmurs, no rubs, no gallops. ABDOMEN: Soft, nontender. NEUROLOGICAL: Motor and sensation grossly intact, alert and oriented times X 3. EXTREMITIES: Warm, well perfused, no edema.     CBC    Component Value Date/Time   WBC 6.6 01/26/2024 0925   WBC 4.3 03/13/2023 0548   RBC 4.60 01/26/2024 0925   RBC 4.61 03/13/2023 0548   HGB 14.6 01/26/2024 0925   HCT 43.7 01/26/2024 0925   PLT 203 01/26/2024 0925   MCV 95 01/26/2024 0925   MCH 31.7 01/26/2024 0925   MCH 31.2 03/13/2023 0548   MCHC 33.4 01/26/2024 0925   MCHC 32.0 03/13/2023 0548   RDW 11.7 01/26/2024 0925   LYMPHSABS 1.1 01/26/2024 0925   MONOABS 0.2 03/12/2023 2006   EOSABS 0.1 01/26/2024 0925   BASOSABS 0.0 01/26/2024 0925     Chest imaging: I reviewed his CT chest performed in January 2025 which shows evidence of diffuse upper lobe predominant emphysema  PFT:    Latest Ref Rng & Units 09/26/2024    1:49 PM  PFT Results  FVC-Pre L 2.93   FVC-Predicted Pre % 65   FVC-Post L 3.40   FVC-Predicted Post % 75   Pre FEV1/FVC % % 53   Post FEV1/FCV % % 57   FEV1-Pre L 1.56   FEV1-Predicted Pre % 46   FEV1-Post L 1.94   DLCO uncorrected ml/min/mmHg 12.98   DLCO UNC% % 49   DLVA Predicted % 58   TLC L 6.93   TLC % Predicted % 97   RV % Predicted % 136   I reviewed his PFTs performed today which show evidence of moderate to severe obstruction with evidence of reduced diffusion  suggestive of emphysema.       Assessment & Plan:   Assessment and Plan Assessment & Plan Chronic obstructive pulmonary disease with emphysema, frequent exacerbations Moderate to severe obstructive lung disease with significant emphysema. PFT shows FEV1 at 46% predicted and DLCO at 49%, indicating impaired gas exchange. Current inhaler Trelegy was too expensive.  Plan to give Breztri  samples..  Prescription for Anoro Ellipta  sent as an alternative that is cheaper.  Patient does not like using his nebulizer machine and prefers to use an inhaler instead.  He reports significant symptom improvement when he uses his inhalers.  He is already scheduled for lung cancer screening CT in January 2026. - Prescribed Anoro inhaler, one puff once daily. - Provided samples of Trelegy and Breztri  if available. - Discussed potential for portable oxygen  concentrator. - Scheduled follow-up in three months.  Tobacco use disorder Continued tobacco use despite significant lung damage. Urgent need to quit smoking to prevent further deterioration of lung function. Wellbutrin  and nicotine  replacement therapy recommended. - Recommended starting Wellbutrin  for smoking cessation. -  Advised use of nicotine  patches and gum in conjunction with Wellbutrin . - Emphasized immediate cessation of smoking.  Smoking/Tobacco Cessation Counseling FAOLAN SPRINGFIELD is a current user of tobacco or nicotine  products. He is considering quitting at this time. Counseling provided today addressed the risks of continued use and the benefits of cessation. Discussed tobacco/nicotine  use history, readiness to quit, and evidence-based treatment options including behavioral strategies, support resources, and pharmacologic therapies. Provided encouragement and educational materials on steps and resources to quit smoking. Patient questions were addressed, and follow-up recommended for continued support. Total time spent on counseling: 4  minutes.   Patient was noted to be satting 87% on room air at rest.  Placed on 3 L oxygen  and was satting 94%  Vitals:   09/26/24 1441 09/26/24 1446  BP: 114/66   Pulse: 73   Height: 5' 10.25 (1.784 m)   Weight: 198 lb (89.8 kg)   SpO2: (!) 87% Comment: on RA 94% Comment: 3lpm continuous o2  BMI (Calculated): 28.22          Zola Herter, MD [facility] Office: [phone]

## 2024-09-26 NOTE — Patient Instructions (Signed)
  VISIT SUMMARY: Today, we discussed your chronic obstructive pulmonary disease (COPD) and the importance of quitting smoking. We reviewed your current medications and explored more affordable options. We also talked about your lung function test results and potential use of a portable oxygen  concentrator.  YOUR PLAN: -CHRONIC OBSTRUCTIVE PULMONARY DISEASE WITH EMPHYSEMA: COPD with emphysema is a lung condition that makes it hard to breathe and can lead to frequent lung infections. Your lung function is significantly reduced. We have prescribed Anoro inhaler, one puff once daily, as a more affordable option. We also provided samples of Trelegy and Breztri  if available. We discussed the potential for a portable oxygen  concentrator to help with your breathing.  -TOBACCO USE DISORDER: Tobacco use disorder means you are addicted to smoking, which is causing significant damage to your lungs. It is crucial to quit smoking immediately to prevent further harm. We recommend starting Wellbutrin  to help you quit, along with nicotine  patches and gum. Stopping smoking is essential for your health.  INSTRUCTIONS: Please start using the Anoro inhaler as prescribed, one puff once daily. Begin taking Wellbutrin  and use nicotine  patches and gum to help quit smoking. We will follow up in three months to check on your progress and discuss the potential for a portable oxygen  concentrator.             VISIT SUMMARY: Today, we discussed your chronic obstructive pulmonary disease (COPD) and the importance of quitting smoking. We reviewed your current medications and explored more affordable options. We also talked about your lung function test results and potential use of a portable oxygen  concentrator.  YOUR PLAN: -CHRONIC OBSTRUCTIVE PULMONARY DISEASE WITH EMPHYSEMA: COPD with emphysema is a lung condition that makes it hard to breathe and can lead to frequent lung infections. Your lung function is significantly  reduced. We have prescribed Anoro inhaler, one puff once daily, as a more affordable option. We also provided samples of Trelegy and Breztri  if available. We discussed the potential for a portable oxygen  concentrator to help with your breathing.  -TOBACCO USE DISORDER: Tobacco use disorder means you are addicted to smoking, which is causing significant damage to your lungs. It is crucial to quit smoking immediately to prevent further harm. We recommend starting Wellbutrin  to help you quit, along with nicotine  patches and gum. Stopping smoking is essential for your health.  INSTRUCTIONS: Please start using the Anoro inhaler as prescribed, one puff once daily. Begin taking Wellbutrin  and use nicotine  patches and gum to help quit smoking. We will follow up in three months to check on your progress and discuss the potential for a portable oxygen  concentrator.  Call 1-800-quit-NOW [phone])490-4504) to get free nicotine  replacement and counseling from the state of [place]                          Contains text generated by [employer].                    Contains text generated by [employer]bridge.              Contains text generated by [employer].

## 2024-09-26 NOTE — Patient Instructions (Signed)
 Full pft performed today

## 2024-10-02 ENCOUNTER — Other Ambulatory Visit: Payer: Self-pay

## 2024-10-02 DIAGNOSIS — J441 Chronic obstructive pulmonary disease with (acute) exacerbation: Secondary | ICD-10-CM

## 2024-10-07 ENCOUNTER — Other Ambulatory Visit (HOSPITAL_BASED_OUTPATIENT_CLINIC_OR_DEPARTMENT_OTHER): Payer: Self-pay

## 2024-10-07 ENCOUNTER — Ambulatory Visit: Payer: Self-pay

## 2024-10-07 NOTE — Telephone Encounter (Signed)
 Attempt # 1 to reach patient to triage symptoms. Unable to leave VM to call back   LOV note: seroquel if Lyrica  ineffective for pain control and insomnia  Copied from CRM #[id number]. Topic: Clinical - Red Word Triage >> Oct 07, 2024  8:53 AM Justin [name] wrote: [facility] that prompted transfer to Nurse Triage: Pt calling reports he is in tremendous pain, in his hips, which is not helping with sleeping, due to the pain.  Pt reports is taking Pregablin (Lyrica ), prescribed on 08/29/24, but it is not helping. He has not had any relief since taking medication.   Pt would like to know what to do for increasing hip pain.   Pt Ph. [phone] >> Oct 07, 2024  9:12 AM Justin [name] wrote: Pt holding for NT, unable to hold any longer for increased hip pain.  Requesting a call back.  Pt ph. [phone]

## 2024-10-07 NOTE — Telephone Encounter (Signed)
 FYI Only or Action Required?: FYI only for provider: appointment scheduled on 10/10/24.  Patient was last seen in primary care on 08/29/2024 by Rothfuss, Lang DASEN, PA-C.  Called Nurse Triage reporting Hip Pain.  Symptoms began about a month ago.  Interventions attempted: Prescription medications: hydrocodone , lyrica  not effective and Rest, hydration, or home remedies.  Symptoms are: gradually worsening.  Triage Disposition: See HCP Within 4 Hours (Or PCP Triage) 4-24 hours   Patient/caregiver understands and will follow disposition?: No, wishes to speak with PCP    Patient requesting appt requesting referral for hip pain                 Reason for Disposition  [1] SEVERE pain (e.g., excruciating, unable to do any normal activities) AND [2] not improved after 2 hours of pain medicine  Answer Assessment - Initial Assessment Questions Recommended UC/ED if pain not relieved by prescribed medications. Patient reports pain clinic will not return his call and he is not going to UC or ED for chronic severe pain. Scheduled appt with PCP to discuss options for pain in right hip and if referral is needed.  Appt scheduled 10/10/24.       1. LOCATION and RADIATION: Where is the pain located? Does the pain spread (shoot) anywhere else?     Right hip pain  2. QUALITY: What does the pain feel like?  (e.g., sharp, dull, aching, burning)     Deep ache inside hip  3. SEVERITY: How bad is the pain? What does it keep you from doing?   (Scale 1-10; or mild, moderate, severe)     Moderate to severe 4. ONSET: When did the pain start? Does it come and go, or is it there all the time?     Since November 5. WORK OR EXERCISE: Has there been any recent work or exercise that involved this part of the body?      Works on housing  6. CAUSE: What do you think is causing the hip pain?      S/p surgery on right hip and medications not effective, pain clinic will not call  patient back.  7. AGGRAVATING FACTORS: What makes the hip pain worse? (e.g., walking, climbing stairs, running)     Can walk  8. OTHER SYMPTOMS: Do you have any other symptoms? (e.g., back pain, pain shooting down leg,  fever, rash)     Right hip pain , low back. Deep pain affects work, not able to sleep .  Protocols used: Hip Pain-A-AH

## 2024-10-10 ENCOUNTER — Ambulatory Visit (INDEPENDENT_AMBULATORY_CARE_PROVIDER_SITE_OTHER): Admitting: Student

## 2024-10-10 ENCOUNTER — Encounter (HOSPITAL_BASED_OUTPATIENT_CLINIC_OR_DEPARTMENT_OTHER): Payer: Self-pay | Admitting: Student

## 2024-10-10 VITALS — BP 156/88 | HR 68 | Temp 98.0°F | Resp 16 | Ht 70.25 in | Wt 195.8 lb

## 2024-10-10 DIAGNOSIS — G8929 Other chronic pain: Secondary | ICD-10-CM

## 2024-10-10 DIAGNOSIS — M545 Low back pain, unspecified: Secondary | ICD-10-CM

## 2024-10-10 DIAGNOSIS — M25559 Pain in unspecified hip: Secondary | ICD-10-CM | POA: Diagnosis not present

## 2024-10-10 MED ORDER — PREGABALIN 50 MG PO CAPS: 50.0000 mg | ORAL_CAPSULE | Freq: Two times a day (BID) | ORAL | 1 refills | Status: DC

## 2024-10-10 NOTE — Patient Instructions (Signed)
 It was nice to see you today!  As we discussed in clinic:  Please increase your lyrica  to 50 mg in the morning and 25 mg at night for one week and then 50 mg in the morning and 50 mg at night.  If you have any problems before your next visit feel free to message me via MyChart (minor issues or questions) or call the office, otherwise you may reach out to schedule an office visit.  Thank you! Yona Stansbury, PA-C

## 2024-10-10 NOTE — Progress Notes (Unsigned)
 "  Acute Office Visit  Subjective:     Patient ID: Justin [name], male    DOB: 12-30-1955, 68 y.o.   MRN: [id number]  Chief Complaint  Patient presents with   Hip Pain    Patient reports pain clinic will not return his call and when they called back they could not see him until 10/19/2024. Scheduled appt with PCP to discuss options for pain in right hip and if referral is needed. Hydrocodone , lyrica  not effective and Rest, hydration, or home remedies. Pt said he was referred to Dr. Bonner and was refused to be seen. Was not given a reason. Was switched to gabapentin  to pregabalin  and cannot tell a difference. Sterid shots only help for two days. Has not work in 2 days.     HPI  Discussed the use of AI scribe software for clinical note transcription with the patient, who gave verbal consent to proceed.  History of Present Illness   Justin [name] is a 68 year old male who presents with uncontrolled hip and back pain.  He has been experiencing severe pain in his hip and back, describing it as feeling like his hip is 'coming through my leg'. This pain has significantly impacted his ability to work, causing him to miss work for the past two days, which is unusual for him. He has been receiving cortisone shots without relief and was previously on gabapentin , which provided some relief at a higher dose. Currently, he is taking Lyrica  (pregabalin ) at 25 mg, but it has not been effective in managing his pain.  He is facing difficulties with his pain management clinic, as his previous PA abruptly quit, and he has not been able to get a response from the clinic regarding his treatment. He attempted to get a second opinion from [facility] but was refused an appointment without explanation, leading to frustration with the lack of communication and support from his current pain management providers.  He is also taking hydrocodone , which helps reduce his pain by about a third when he  takes two pills in the morning, but the relief only lasts until about 10 AM. He reports that hydrocodone  is not as effective as it used to be.  In addition to his pain issues, he mentions using Trelegy for breathing difficulties, which he purchased for $178. He was also given samples of Breztri  as a substitute and reports doing fine with it. No significant breathing issues are reported at this time.  Socially, he is retired but continues to work because he enjoys it and for extra income. He describes himself as a 'workaholic'.     ROS Per HPI     Objective:    BP (!) 156/88   Pulse 68   Temp 98 F (36.7 C) (Oral)   Resp 16   Ht 5' 10.25 (1.784 m)   Wt 195 lb 12.8 oz (88.8 kg)   SpO2 95%   BMI 27.89 kg/m  BP Readings from Last 3 Encounters:  10/10/24 (!) 156/88  09/26/24 114/66  08/31/24 (!) 160/101   Wt Readings from Last 3 Encounters:  10/10/24 195 lb 12.8 oz (88.8 kg)  09/26/24 198 lb (89.8 kg)  08/31/24 195 lb (88.5 kg)   SpO2 Readings from Last 3 Encounters:  10/10/24 95%  09/26/24 94%  08/29/24 94%      Physical Exam Constitutional:      General: He is not in acute distress.    Appearance: Normal appearance. He is not  ill-appearing.  HENT:     Head: Normocephalic and atraumatic.     Right Ear: External ear normal.     Left Ear: External ear normal.     Nose: Nose normal.  Eyes:     Conjunctiva/sclera: Conjunctivae normal.  Cardiovascular:     Rate and Rhythm: Normal rate and regular rhythm.     Pulses: Normal pulses.     Heart sounds: Normal heart sounds. No murmur heard.    No friction rub.  Pulmonary:     Effort: Pulmonary effort is normal. No respiratory distress.     Breath sounds: Normal breath sounds. No wheezing, rhonchi or rales.  Skin:    General: Skin is warm and dry.     Coloration: Skin is not jaundiced or pale.  Neurological:     Mental Status: He is alert.  Psychiatric:        Mood and Affect: Mood normal.        Behavior: Behavior  normal.     No results found for any visits on 10/10/24.      Assessment & Plan:   Assessment and Plan    Chronic low back pain Chronic, not at goal severe chronic low back pain affecting work. Previous cortisone injections were ineffective. Current pain management with Lyrica  is insufficient. Gabapentin  was previously effective at a higher dose. Hydrocodone  provides limited relief and may have led to tolerance. Referral to [facility] was unsuccessful. - Increased Lyrica  to 50 mg capsules, instructed to take 50 mg in the morning and 25 mg at night for one week, then increase to 50 mg twice daily. - Monitor response to increased Lyrica  dosage and adjust as needed. - [facility] follow with [facility] for continued aid in therapy/pain management  Chronic hip pain Severe chronic hip pain radiating down the leg, affecting work. Previous referral to [facility] was unsuccessful. Current pain management with hydrocodone  provides limited relief. - Monitor response to increased Lyrica  dosage for potential improvement in hip pain.     Elevated BP - Suspecting that BP is elevated secondary to pain today.  Return if symptoms worsen or fail to improve.  Stacie Knutzen T Joselynn Amoroso, PA-C  "

## 2024-10-14 DIAGNOSIS — J441 Chronic obstructive pulmonary disease with (acute) exacerbation: Secondary | ICD-10-CM | POA: Diagnosis not present

## 2024-10-17 ENCOUNTER — Telehealth (HOSPITAL_BASED_OUTPATIENT_CLINIC_OR_DEPARTMENT_OTHER): Payer: Self-pay

## 2024-10-17 ENCOUNTER — Other Ambulatory Visit (HOSPITAL_COMMUNITY): Payer: Self-pay

## 2024-10-17 DIAGNOSIS — M545 Low back pain, unspecified: Secondary | ICD-10-CM

## 2024-10-17 NOTE — Telephone Encounter (Signed)
 Per [facility] [place], lyrica  needs PA. It will also need to be resent to different pharmacy once approved. We are sending message to PA team.

## 2024-10-17 NOTE — Telephone Encounter (Signed)
 Copied from CRM [id number]. Topic: Clinical - Medication Prior Auth >> Oct 17, 2024 10:57 AM Gustabo D wrote: Pt is hurting bad and is needing his -pregabalin  (LYRICA ) 50 MG capsule Denied speaking to the nurse .  [place] [facility]

## 2024-10-18 ENCOUNTER — Other Ambulatory Visit (HOSPITAL_COMMUNITY): Payer: Self-pay

## 2024-10-18 ENCOUNTER — Telehealth (HOSPITAL_BASED_OUTPATIENT_CLINIC_OR_DEPARTMENT_OTHER): Payer: Self-pay | Admitting: Pharmacy Technician

## 2024-10-18 ENCOUNTER — Ambulatory Visit: Payer: Self-pay

## 2024-10-18 MED ORDER — PREGABALIN 50 MG PO CAPS: 50.0000 mg | ORAL_CAPSULE | Freq: Two times a day (BID) | ORAL | 1 refills | Status: AC

## 2024-10-18 NOTE — Telephone Encounter (Signed)
-----   Message from [facility] Macy J sent at 10/17/2024  2:05 PM EST ----- Regarding: Lyrica  Per [facility] [place], lyrica  needs PA. It will also need to be resent to different pharmacy once approved. [facility] he [facility] purchase it. Please let us  know update after. Thanks.

## 2024-10-18 NOTE — Telephone Encounter (Signed)
 Patient calling in upset, states he was told he would receive a call back from a nurse yesterday. Read the fpl group from [place]. Patient is requesting a call back with an update on the prior authorization, states he would like to know if he should just go to the pharmacy and pay out of pocked.

## 2024-10-18 NOTE — Addendum Note (Signed)
 Addended by: MIRIAM JOSEPH on: 10/18/2024 09:09 AM   Modules accepted: Orders

## 2024-10-18 NOTE — Telephone Encounter (Signed)
 FYI Only or Action Required?: Action required by provider: clinical question for provider.  Patient was last seen in primary care on 10/10/2024 by Rothfuss, Lang DASEN, PA-C.  Called Nurse Triage reporting Medication Problem, Back Pain, and Hip Pain.  Symptoms began several years ago.  Interventions attempted: Prescription medications: hydrocodone .  Symptoms are: gradually worsening.  Triage Disposition: Call PCP Within 24 Hours  Patient/caregiver understands and will follow disposition?: Yes             Copied from CRM #[id number]. Topic: Clinical - Red Word Triage >> Oct 18, 2024  8:28 AM Deaijah H wrote: Red Word that prompted transfer to Nurse Triage: Patient called in to follow up on refill request. Requested to speak with nurse. Hip and back in pain bad. Reason for Disposition  [1] Follow-up call from patient regarding patient's clinical status AND [2] information NON-URGENT  Answer Assessment - Initial Assessment Questions 1. REASON FOR CALL or QUESTION: What is your reason for calling today? or How can I best     Patient states he is upset because he was told he would receive a call back yesterday from a nurse but did not hear back. Read CMA's note in mychart to patient regarding prior authorization for pregabalin . Patient states he is going back to [employer], if it is not being sent today to [facility] then he would like it sent [facility]. Complaining of chronic lower back and right hip pain. Taking hydrocodone  but states he would like to know if he should go back on gabapentin  until this prior authorization is completed.   2. CALLER: Document the source of call. (e.g., laboratory staff, caregiver or patient).     Patient.  Protocols used: PCP Call - No Triage-A-AH

## 2024-10-18 NOTE — Telephone Encounter (Signed)
 Pharmacy Patient Advocate Encounter   Received notification from Staff Ms[facility] that prior authorization for Pregabalin  50mg  is required/requested.   Insurance verification completed.   The patient is insured through [employer]).   Per test claim: PA required and submitted KEY/EOC/Request #: [id number] pending Prior Authorization request for the Patient and Medication exist.

## 2024-10-18 NOTE — Telephone Encounter (Signed)
 Pt wants to pay out of pocket. [facility]

## 2024-10-19 ENCOUNTER — Ambulatory Visit: Payer: Self-pay

## 2024-10-19 DIAGNOSIS — G8929 Other chronic pain: Secondary | ICD-10-CM

## 2024-10-19 NOTE — Telephone Encounter (Signed)
 Needs Lyrica  Hip and lower back pain ongoing Pending PA but please call and update pt, he has expressed frustration that he has not heard back.   FYI Only or Action Required?: Action required by provider: medication refill request.  Patient was last seen in primary care on 10/10/2024 by Rothfuss, Lang DASEN, PA-C.  Called Nurse Triage reporting Medication Refill.  S Triage Disposition: Home Care  Patient/caregiver understands and will follow disposition?: Yes Copied from CRM [id number]. Topic: Clinical - Red Word Triage >> Oct 18, 2024  8:28 AM Deaijah H wrote: Red Word that prompted transfer to Nurse Triage: Patient called in to follow up on refill request. Requested to speak with nurse. Hip and back in pain bad. Reason for Disposition  [1] Prescription prescribed recently is not at pharmacy AND [2] triager has access to patient's EMR AND [3] prescription is recorded in the EMR  Answer Assessment - Initial Assessment Questions 1. DRUG NAME: What medicine do you need to have refilled?     Lyrica  Pending prior-authorization, pt needing to be called and updated   6. SYMPTOMS: Do you have any symptoms? Continued lower back pain and hip pain  Protocols used: Medication Refill and Renewal Call-A-AH

## 2024-10-19 NOTE — Telephone Encounter (Signed)
 Pt said she is back in [place]. [facility] [place] and will cancel rx.

## 2024-11-01 ENCOUNTER — Other Ambulatory Visit (HOSPITAL_COMMUNITY): Payer: Self-pay

## 2024-11-01 NOTE — Telephone Encounter (Signed)
 Did another test claim and got a too soon rejection that pt filled 10/17/24. Called the pharmacy to make sure it was billed to his ins and didn't still need a PA. It did go to his Ins and not PA is needed at this time.

## 2024-11-07 ENCOUNTER — Ambulatory Visit (INDEPENDENT_AMBULATORY_CARE_PROVIDER_SITE_OTHER)
Admission: RE | Admit: 2024-11-07 | Discharge: 2024-11-07 | Disposition: A | Discharge status: Home / Self Care | Source: Ambulatory Visit

## 2024-11-07 DIAGNOSIS — Z122 Encounter for screening for malignant neoplasm of respiratory organs: Secondary | ICD-10-CM

## 2024-11-07 DIAGNOSIS — F17210 Nicotine dependence, cigarettes, uncomplicated: Secondary | ICD-10-CM

## 2024-11-07 DIAGNOSIS — J441 Chronic obstructive pulmonary disease with (acute) exacerbation: Secondary | ICD-10-CM

## 2024-11-09 ENCOUNTER — Ambulatory Visit (HOSPITAL_BASED_OUTPATIENT_CLINIC_OR_DEPARTMENT_OTHER): Admitting: Radiology

## 2024-11-18 ENCOUNTER — Telehealth: Payer: Self-pay | Admitting: *Deleted

## 2024-11-18 NOTE — Telephone Encounter (Signed)
 Copied from CRM #[id number]. Topic: Clinical - Lab/Test Results >> Nov 14, 2024 11:24 AM Corean SAUNDERS wrote: Reason for CRM: Katheryn with [facility] states the patients chest CT results are now available for Dr. Zaida in patients chart.  Dr. Donzetta will not be back/available until Monday, 11/21/24.  Routing to Dr. Zaida.  ---------------------------------------------------------------------------------------------------------------------------------------------- Dr. Zaida,  This patient's CT results are available now per [facility].  Thank you.

## 2024-11-22 ENCOUNTER — Ambulatory Visit: Payer: Self-pay

## 2024-11-22 ENCOUNTER — Telehealth: Payer: Self-pay | Admitting: *Deleted

## 2024-11-22 DIAGNOSIS — R918 Other nonspecific abnormal finding of lung field: Secondary | ICD-10-CM

## 2024-11-22 NOTE — Telephone Encounter (Signed)
 Copied from CRM [id number]. Topic: Clinical - Lab/Test Results >> Nov 22, 2024  2:15 PM Corean SAUNDERS wrote: Reason for CRM: CT results note from Dr. Zaida on 2/3 was relayed to patient. Patients responded stating he feels okay without  antibiotics and did not feel sick during the CT scan but states he will call the clinic if he starts feeling unwell. Patient verbalized understanding of 2 month CT follow up. NFN

## 2024-12-26 ENCOUNTER — Ambulatory Visit

## 2025-01-20 ENCOUNTER — Other Ambulatory Visit

## 2025-02-27 ENCOUNTER — Ambulatory Visit (HOSPITAL_BASED_OUTPATIENT_CLINIC_OR_DEPARTMENT_OTHER): Admitting: Student
